# Patient Record
Sex: Male | Born: 1940 | Race: White | Hispanic: No | Marital: Married | Smoking: Never smoker
Health system: Southern US, Community
[De-identification: ages and names within clinical notes are randomized; demographics above are authoritative.]

## PROBLEM LIST (undated history)

## (undated) DIAGNOSIS — I10 Essential (primary) hypertension: Secondary | ICD-10-CM

## (undated) DIAGNOSIS — J189 Pneumonia, unspecified organism: Secondary | ICD-10-CM

## (undated) DIAGNOSIS — E785 Hyperlipidemia, unspecified: Secondary | ICD-10-CM

## (undated) DIAGNOSIS — F419 Anxiety disorder, unspecified: Secondary | ICD-10-CM

## (undated) DIAGNOSIS — E119 Type 2 diabetes mellitus without complications: Secondary | ICD-10-CM

## (undated) DIAGNOSIS — Z87442 Personal history of urinary calculi: Secondary | ICD-10-CM

## (undated) DIAGNOSIS — J349 Unspecified disorder of nose and nasal sinuses: Secondary | ICD-10-CM

## (undated) DIAGNOSIS — M549 Dorsalgia, unspecified: Secondary | ICD-10-CM

## (undated) DIAGNOSIS — M1990 Unspecified osteoarthritis, unspecified site: Secondary | ICD-10-CM

## (undated) DIAGNOSIS — N400 Enlarged prostate without lower urinary tract symptoms: Secondary | ICD-10-CM

## (undated) DIAGNOSIS — K219 Gastro-esophageal reflux disease without esophagitis: Secondary | ICD-10-CM

## (undated) DIAGNOSIS — C801 Malignant (primary) neoplasm, unspecified: Secondary | ICD-10-CM

## (undated) DIAGNOSIS — E291 Testicular hypofunction: Secondary | ICD-10-CM

## (undated) DIAGNOSIS — F32A Depression, unspecified: Secondary | ICD-10-CM

## (undated) DIAGNOSIS — T7840XA Allergy, unspecified, initial encounter: Secondary | ICD-10-CM

## (undated) DIAGNOSIS — M79606 Pain in leg, unspecified: Secondary | ICD-10-CM

## (undated) DIAGNOSIS — R7303 Prediabetes: Secondary | ICD-10-CM

## (undated) DIAGNOSIS — N529 Male erectile dysfunction, unspecified: Secondary | ICD-10-CM

## (undated) HISTORY — DX: Hyperlipidemia, unspecified: E78.5

## (undated) HISTORY — DX: Unspecified disorder of nose and nasal sinuses: J34.9

## (undated) HISTORY — DX: Dorsalgia, unspecified: M54.9

## (undated) HISTORY — PX: SKIN CANCER EXCISION: SHX779

## (undated) HISTORY — DX: Pain in leg, unspecified: M79.606

## (undated) HISTORY — PX: NECK SURGERY: SHX720

## (undated) HISTORY — PX: CHOLECYSTECTOMY: SHX55

## (undated) HISTORY — PX: OTHER SURGICAL HISTORY: SHX169

## (undated) HISTORY — PX: CATARACT EXTRACTION, BILATERAL: SHX1313

## (undated) HISTORY — DX: Allergy, unspecified, initial encounter: T78.40XA

---

## 1988-12-11 HISTORY — PX: CHOLECYSTECTOMY: SHX55

## 1997-04-12 HISTORY — PX: TRANSURETHRAL RESECTION OF PROSTATE: SHX73

## 2004-11-09 ENCOUNTER — Ambulatory Visit: Payer: Self-pay | Admitting: Gastroenterology

## 2004-11-20 ENCOUNTER — Encounter (INDEPENDENT_AMBULATORY_CARE_PROVIDER_SITE_OTHER): Payer: Self-pay | Admitting: *Deleted

## 2004-11-20 ENCOUNTER — Ambulatory Visit: Payer: Self-pay | Admitting: Gastroenterology

## 2004-11-26 ENCOUNTER — Ambulatory Visit (HOSPITAL_COMMUNITY): Admission: RE | Admit: 2004-11-26 | Discharge: 2004-11-26 | Payer: Self-pay | Admitting: Gastroenterology

## 2004-12-11 ENCOUNTER — Ambulatory Visit: Payer: Self-pay | Admitting: Gastroenterology

## 2005-02-25 ENCOUNTER — Ambulatory Visit: Payer: Self-pay | Admitting: Gastroenterology

## 2005-03-02 ENCOUNTER — Ambulatory Visit: Payer: Self-pay | Admitting: Cardiology

## 2005-04-02 ENCOUNTER — Ambulatory Visit: Payer: Self-pay | Admitting: Gastroenterology

## 2009-02-03 ENCOUNTER — Inpatient Hospital Stay (HOSPITAL_COMMUNITY): Admission: RE | Admit: 2009-02-03 | Discharge: 2009-02-03 | Payer: Self-pay | Admitting: Neurological Surgery

## 2009-10-03 ENCOUNTER — Encounter (INDEPENDENT_AMBULATORY_CARE_PROVIDER_SITE_OTHER): Payer: Self-pay | Admitting: *Deleted

## 2009-10-09 ENCOUNTER — Encounter (INDEPENDENT_AMBULATORY_CARE_PROVIDER_SITE_OTHER): Payer: Self-pay | Admitting: *Deleted

## 2009-11-11 ENCOUNTER — Encounter (INDEPENDENT_AMBULATORY_CARE_PROVIDER_SITE_OTHER): Payer: Self-pay | Admitting: *Deleted

## 2009-11-13 ENCOUNTER — Ambulatory Visit: Payer: Self-pay | Admitting: Gastroenterology

## 2009-11-20 ENCOUNTER — Ambulatory Visit: Payer: Self-pay | Admitting: Gastroenterology

## 2009-11-24 ENCOUNTER — Encounter: Payer: Self-pay | Admitting: Gastroenterology

## 2010-02-12 ENCOUNTER — Encounter: Admission: RE | Admit: 2010-02-12 | Discharge: 2010-02-12 | Payer: Self-pay | Admitting: Neurological Surgery

## 2010-05-12 NOTE — Letter (Signed)
Summary: Previsit letter  [facility]  [address] [place], [place] [address]   Phone: [phone]  Fax: [phone]       10/09/2009 MRN: [id number]  [facility] [address] [place], [place]  [address]  Dear Daniel Wagner,  Welcome to the Gastroenterology Division at [facility].    You are scheduled to see a nurse for your pre-procedure visit on 11/13/2009 at 10:30am on the 3rd floor at [facility], [address]  We ask that you try to arrive at our office 15 minutes prior to your appointment time to allow for check-in.  Your nurse visit will consist of discussing your medical and surgical history, your immediate family medical history, and your medications.    Please bring a complete list of all your medications or, if you prefer, bring the medication bottles and we will list them.  We will need to be aware of both prescribed and over the counter drugs.  We will need to know exact dosage information as well.  If you are on blood thinners (Coumadin, Plavix, Aggrenox, Ticlid, etc.) please call our office today/prior to your appointment, as we need to consult with your physician about holding your medication.   Please be prepared to read and sign documents such as consent forms, a financial agreement, and acknowledgement forms.  If necessary, and with your consent, a friend or relative is welcome to sit-in on the nurse visit with you.  Please bring your insurance card so that we may make a copy of it.  If your insurance requires a referral to see a specialist, please bring your referral form from your primary care physician.  No co-pay is required for this nurse visit.     If you cannot keep your appointment, please call [phone] to cancel or reschedule prior to your appointment date.  This allows [place] the opportunity to schedule an appointment for another patient in need of care.    Thank you for choosing [facility] for your medical needs.   We appreciate the opportunity to care for you.  Please visit [place] at our website  to learn more about our practice.                     Sincerely.                                                                                                                   The Gastroenterology Division

## 2010-05-12 NOTE — Letter (Signed)
Summary: Colonoscopy Letter  [facility]  [address] [place], [place] [address]   Phone: [phone]  Fax: [phone]      October 03, 2009 MRN: [id number]   [facility] [address] [place], [place]  [address]   Dear Daniel Wagner,   According to your medical record, it is time for you to schedule a Colonoscopy. The American Cancer Society recommends this procedure as a method to detect early colon cancer. Patients with a family history of colon cancer, or a personal history of colon polyps or inflammatory bowel disease are at increased risk.  This letter has beeen generated based on the recommendations made at the time of your procedure. If you feel that in your particular situation this may no longer apply, please contact our office.  Please call our office at [phone] to schedule this appointment or to update your records at your earliest convenience.  Thank you for cooperating with [place] to provide you with the very best care possible.   Sincerely,  Judie Petit T. Russella Dar, M.D.  [facility] [phone]

## 2010-05-12 NOTE — Procedures (Signed)
Summary: Colonoscopy  Patient: Daniel Wagner Note: All result statuses are Final unless otherwise noted.  Tests: (1) Colonoscopy (COL)   COL Colonoscopy           DONE     [facility]     [address]     [place], [place]  [address]           COLONOSCOPY PROCEDURE REPORT     PATIENT:  Daniel Wagner, Daniel Wagner  MR#:  [id number]     BIRTHDATE:  1940-10-06, 69 yrs. old  GENDER:  male     ENDOSCOPIST:  Judie Petit T. 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     MEDICATIONS:   Fentanyl 75 mcg IV, Versed 9 mg IV     DESCRIPTION OF PROCEDURE:   After the risks benefits and     alternatives of the procedure were thoroughly explained, informed     consent was obtained.  Digital rectal exam was performed and     revealed no abnormalities.   The LB PCF-H180AL [id number] endoscope     was introduced through the anus and advanced to the cecum, which     was identified by both the appendix and ileocecal valve, without     limitations.  The quality of the prep was good, using MoviPrep.     The instrument was then slowly withdrawn as the colon was fully     examined.     <<PROCEDUREIMAGES>>     FINDINGS:  A sessile polyp was found in the mid transverse colon.     It was 4 mm in size. The polyp was removed using cold biopsy     forceps.  A normal appearing cecum, ileocecal valve, and     appendiceal orifice were identified. The ascending, hepatic     flexure, splenic flexure, descending, sigmoid colon, and rectum     appeared unremarkable. Retroflexed views in the rectum revealed no     abnormalities.  The time to cecum =  2  minutes. The scope was     then withdrawn (time =  10  min) from the patient and the     procedure completed.           COMPLICATIONS:  None           ENDOSCOPIC IMPRESSION:     1) 4 mm  sessile polyp in the mid transverse colon           RECOMMENDATIONS:     1) Await pathology results     2) Repeat Colonoscopy in 5 years pending pathology review           Malcolm T. Russella Dar, MD, [facility]           n.     eSIGNED:   Venita Lick. Stark at 11/20/2009 10:52 AM           Eliane Decree, [id number]  Note: An exclamation mark (!) indicates a result that was not dispersed into the flowsheet. Document Creation Date: 11/20/2009 10:53 AM _______________________________________________________________________  (1) Order result status: Final Collection or observation date-time: 11/20/2009 10:47 Requested date-time:  Receipt date-time:  Reported date-time:  Referring Physician:   Ordering Physician: Judie Petit  Russella Dar [phone]) Specimen Source:  Source: Launa Grill Order Number: [id number] Lab site:   Appended Document: Colonoscopy     Procedures Next Due Date:    Colonoscopy: 11/2014

## 2010-05-12 NOTE — Miscellaneous (Signed)
Summary: [facility]  Clinical Lists Changes  Medications: Added new medication of MOVIPREP 100 GM  SOLR (PEG-KCL-NACL-NASULF-NA ASC-C) As per prep instructions. - Signed Rx of MOVIPREP 100 GM  SOLR (PEG-KCL-NACL-NASULF-NA ASC-C) As per prep instructions.;  #1 x 0;  Signed;  Entered by: Ezra Sites RN;  Authorized by: Meryl Dare MD [facility] Graham;  Method used: Electronically to [facility] [address] [place], [place], [place]  [address], Ph: [phone] or [phone], Fax: [phone] Allergies: Added new allergy or adverse reaction of ASA Observations: Added new observation of NKA: F (11/13/2009 10:01)    Prescriptions: MOVIPREP 100 GM  SOLR (PEG-KCL-NACL-NASULF-NA ASC-C) As per prep instructions.  #1 x 0   Entered by:   Ezra Sites RN   Authorized by:   Meryl Dare MD [facility]   Signed by:   Ezra Sites RN on 11/13/2009   Method used:   Electronically to        [facility]       [address]       [place], [place]  [address]       Ph: [phone] or [phone]       Fax: [phone]   RxID:   [id number]

## 2010-05-12 NOTE — Letter (Signed)
Summary: Patient Notice- Polyp Results  [facility]  [address] [place], [place] [address]   Phone: [phone]  Fax: [phone]        November 24, 2009 MRN: [id number]    [facility] [address] [place], [place]  [address]    Dear Daniel Wagner,  I am pleased to inform you that the colon polyp(s) removed during your recent colonoscopy was (were) found to be benign (no cancer detected) upon pathologic examination.  I recommend you have a repeat colonoscopy examination in 5 years to look for recurrent polyps, as having colon polyps increases your risk for having recurrent polyps or even colon cancer in the future.  Should you develop new or worsening symptoms of abdominal pain, bowel habit changes or bleeding from the rectum or bowels, please schedule an evaluation with either your primary care physician or with me.  Continue treatment plan as outlined the day of your exam.  Please call us if you are having persistent problems or have questions about your condition that have not been fully answered at this time.  Sincerely,  Meryl Dare MD [facility]  This letter has been electronically signed by your physician.  Appended Document: Patient Notice- Polyp Results letter mailed

## 2010-05-12 NOTE — Letter (Signed)
Summary: [facility] Instructions  [facility]  [address] [place], [place] [address]   Phone: [phone]  Fax: [phone]       Daniel Wagner    68-28-1942    MRN: [id number]        Procedure Day [name]:  Daniel Wagner  11/20/09     Arrival Time: 9:30am     Procedure Time: 10:30am     Location of Procedure:                    Daniel Wagner  [facility] (4th Floor)                        PREPARATION FOR COLONOSCOPY WITH MOVIPREP   Starting 5 days prior to your procedure  SATURDAY 08/06  do not eat nuts, seeds, popcorn, corn, beans, peas,  salads, or any raw vegetables.  Do not take any fiber supplements (e.g. Metamucil, Citrucel, and Benefiber).  THE DAY BEFORE YOUR PROCEDURE         DATE:  [facility]  08/10  1.  Drink clear liquids the entire day-NO SOLID FOOD  2.  Do not drink anything colored red or purple.  Avoid juices with pulp.  No orange juice.  3.  Drink at least 64 oz. (8 glasses) of fluid/clear liquids during the day to prevent dehydration and help the prep work efficiently.  CLEAR LIQUIDS INCLUDE: Water Jello Ice Popsicles Tea (sugar ok, no milk/cream) Powdered fruit flavored drinks Coffee (sugar ok, no milk/cream) Gatorade Juice: apple, white grape, white cranberry  Lemonade Clear bullion, consomm, broth Carbonated beverages (any kind) Strained chicken noodle soup Hard Candy                             4.  In the morning, mix first dose of MoviPrep solution:    Empty 1 Pouch A and 1 Pouch B into the disposable container    Add lukewarm drinking water to the top line of the container. Mix to dissolve    Refrigerate (mixed solution should be used within 24 hrs)  5.  Begin drinking the prep at 5:00 p.m. The MoviPrep container is divided by 4 marks.   Every 15 minutes drink the solution down to the next mark (approximately 8 oz) until the full liter is complete.   6.  Follow completed prep with 16 oz of clear liquid of your choice  (Nothing red or purple).  Continue to drink clear liquids until bedtime.  7.  Before going to bed, mix second dose of MoviPrep solution:    Empty 1 Pouch A and 1 Pouch B into the disposable container    Add lukewarm drinking water to the top line of the container. Mix to dissolve    Refrigerate  THE DAY OF YOUR PROCEDURE      DATE: THURSDAY  08/11  Beginning at  5:30 a.m. (5 hours before procedure):         1. Every 15 minutes, drink the solution down to the next mark (approx 8 oz) until the full liter is complete.  2. Follow completed prep with 16 oz. of clear liquid of your choice.    3. You may drink clear liquids until 8:30am (2 HOURS BEFORE PROCEDURE).   MEDICATION INSTRUCTIONS  Unless otherwise instructed, you should take regular prescription medications with a small sip of water   as early as possible  the morning of your procedure.          OTHER INSTRUCTIONS  You will need a responsible adult at least 68 years of age to accompany you and drive you home.   This person must remain in the waiting room during your procedure.  Wear loose fitting clothing that is easily removed.  Leave jewelry and other valuables at home.  However, you may wish to bring a book to read or  an iPod/MP3 player to listen to music as you wait for your procedure to start.  Remove all body piercing jewelry and leave at home.  Total time from sign-in until discharge is approximately 2-3 hours.  You should go home directly after your procedure and rest.  You can resume normal activities the  day after your procedure.  The day of your procedure you should not:   Drive   Make legal decisions   Operate machinery   Drink alcohol   Return to work  You will receive specific instructions about eating, activities and medications before you leave.    The above instructions have been reviewed and explained to me by   Daniel Sites RN  November 13, 2009 10:50 AM     I fully understand  and can verbalize these instructions _____________________________ Date _________

## 2010-07-16 LAB — BASIC METABOLIC PANEL
Calcium: 9.5 mg/dL (ref 8.4–10.5)
Chloride: 102 mEq/L (ref 96–112)
GFR calc non Af Amer: >60 mL/min (ref >60)
Glucose, Bld: 99 mg/dL (ref 70–99)
Potassium: 5.2 mEq/L — ABNORMAL HIGH (ref 3.5–5.1)
Sodium: 138 mEq/L (ref 135–145)

## 2010-07-16 LAB — CBC
Hemoglobin: 14.6 g/dL (ref 13.0–17.0)
Platelets: 200 10*3/uL (ref 150–400)
RBC: 4.13 MIL/uL — ABNORMAL LOW (ref 4.22–5.81)
RDW: 12.7 % (ref 11.5–15.5)

## 2010-08-11 HISTORY — PX: BACK SURGERY: SHX140

## 2010-09-08 ENCOUNTER — Encounter (HOSPITAL_COMMUNITY)
Admission: RE | Admit: 2010-09-08 | Discharge: 2010-09-08 | Disposition: A | Discharge status: Home / Self Care | Payer: Medicare Other | Source: Ambulatory Visit | Attending: Neurological Surgery | Admitting: Neurological Surgery

## 2010-09-08 ENCOUNTER — Other Ambulatory Visit (HOSPITAL_COMMUNITY): Payer: Self-pay | Admitting: Neurological Surgery

## 2010-09-08 ENCOUNTER — Ambulatory Visit (HOSPITAL_COMMUNITY)
Admission: RE | Admit: 2010-09-08 | Discharge: 2010-09-08 | Disposition: A | Discharge status: Home / Self Care | Payer: Medicare Other | Source: Ambulatory Visit | Attending: Neurological Surgery | Admitting: Neurological Surgery

## 2010-09-08 DIAGNOSIS — Z01812 Encounter for preprocedural laboratory examination: Secondary | ICD-10-CM | POA: Insufficient documentation

## 2010-09-08 DIAGNOSIS — M5137 Other intervertebral disc degeneration, lumbosacral region: Secondary | ICD-10-CM | POA: Insufficient documentation

## 2010-09-08 DIAGNOSIS — M51379 Other intervertebral disc degeneration, lumbosacral region without mention of lumbar back pain or lower extremity pain: Secondary | ICD-10-CM | POA: Insufficient documentation

## 2010-09-08 DIAGNOSIS — M5136 Other intervertebral disc degeneration, lumbar region: Secondary | ICD-10-CM

## 2010-09-08 DIAGNOSIS — Z01810 Encounter for preprocedural cardiovascular examination: Secondary | ICD-10-CM | POA: Insufficient documentation

## 2010-09-08 LAB — BASIC METABOLIC PANEL
CO2: 30 mEq/L (ref 19–32)
Creatinine, Ser: 1.03 mg/dL (ref 0.4–1.5)
GFR calc non Af Amer: >60 mL/min (ref >60)
Glucose, Bld: 89 mg/dL (ref 70–99)
Sodium: 138 mEq/L (ref 135–145)

## 2010-09-08 LAB — CBC
HCT: 43.7 % (ref 39.0–52.0)
MCHC: 34.3 g/dL (ref 30.0–36.0)
Platelets: 200 10*3/uL (ref 150–400)
RDW: 12.6 % (ref 11.5–15.5)

## 2010-09-08 LAB — SURGICAL PCR SCREEN: Staphylococcus aureus: NEGATIVE

## 2010-09-10 ENCOUNTER — Inpatient Hospital Stay (HOSPITAL_COMMUNITY): Payer: Medicare Other

## 2010-09-10 ENCOUNTER — Inpatient Hospital Stay (HOSPITAL_COMMUNITY)
Admission: RE | Admit: 2010-09-10 | Discharge: 2010-09-13 | DRG: 458 | Disposition: A | Discharge status: Home / Self Care | Payer: Medicare Other | Source: Ambulatory Visit | Attending: Neurological Surgery | Admitting: Neurological Surgery

## 2010-09-10 DIAGNOSIS — Z79899 Other long term (current) drug therapy: Secondary | ICD-10-CM

## 2010-09-10 DIAGNOSIS — M47817 Spondylosis without myelopathy or radiculopathy, lumbosacral region: Secondary | ICD-10-CM | POA: Diagnosis present

## 2010-09-10 DIAGNOSIS — I10 Essential (primary) hypertension: Secondary | ICD-10-CM | POA: Diagnosis present

## 2010-09-10 DIAGNOSIS — K219 Gastro-esophageal reflux disease without esophagitis: Secondary | ICD-10-CM | POA: Diagnosis present

## 2010-09-10 DIAGNOSIS — M48062 Spinal stenosis, lumbar region with neurogenic claudication: Secondary | ICD-10-CM | POA: Diagnosis present

## 2010-09-10 DIAGNOSIS — M4120 Other idiopathic scoliosis, site unspecified: Principal | ICD-10-CM | POA: Diagnosis present

## 2010-09-10 LAB — POCT I-STAT 4, (NA,K, GLUC, HGB,HCT)
Glucose, Bld: 116 mg/dL — ABNORMAL HIGH (ref 70–99)
Glucose, Bld: 112 mg/dL — ABNORMAL HIGH (ref 70–99)
Hemoglobin: 9.5 g/dL — ABNORMAL LOW (ref 13.0–17.0)
Hemoglobin: 9.5 g/dL — ABNORMAL LOW (ref 13.0–17.0)
Hemoglobin: 10.9 g/dL — ABNORMAL LOW (ref 13.0–17.0)
Potassium: 4.1 mEq/L (ref 3.5–5.1)
Potassium: 4.3 mEq/L (ref 3.5–5.1)
Potassium: 4.5 mEq/L (ref 3.5–5.1)
Sodium: 140 mEq/L (ref 135–145)
Sodium: 139 mEq/L (ref 135–145)
Sodium: 139 mEq/L (ref 135–145)
Sodium: 141 mEq/L (ref 135–145)

## 2010-09-10 LAB — CBC
MCH: 32.7 pg (ref 26.0–34.0)
MCHC: 34.5 g/dL (ref 30.0–36.0)
MCV: 94.9 fL (ref 78.0–100.0)
RDW: 12.8 % (ref 11.5–15.5)
WBC: 10.4 10*3/uL (ref 4.0–10.5)

## 2010-09-10 LAB — PROTIME-INR: Prothrombin Time: 19.5 seconds — ABNORMAL HIGH (ref 11.6–15.2)

## 2010-09-10 LAB — GLUCOSE, CAPILLARY: Glucose-Capillary: 126 mg/dL — ABNORMAL HIGH (ref 70–99)

## 2010-09-10 LAB — APTT: aPTT: 31 seconds (ref 24–37)

## 2010-09-11 LAB — CBC
HCT: 32.2 % — ABNORMAL LOW (ref 39.0–52.0)
Hemoglobin: 10.9 g/dL — ABNORMAL LOW (ref 13.0–17.0)
MCH: 32.3 pg (ref 26.0–34.0)
MCHC: 33.9 g/dL (ref 30.0–36.0)
RBC: 3.37 MIL/uL — ABNORMAL LOW (ref 4.22–5.81)

## 2010-09-11 LAB — BASIC METABOLIC PANEL
CO2: 26 mEq/L (ref 19–32)
Calcium: 6.9 mg/dL — ABNORMAL LOW (ref 8.4–10.5)
Chloride: 103 mEq/L (ref 96–112)
Glucose, Bld: 166 mg/dL — ABNORMAL HIGH (ref 70–99)
Sodium: 134 mEq/L — ABNORMAL LOW (ref 135–145)

## 2010-09-11 LAB — PROTIME-INR: INR: 1.17 (ref 0.00–1.49)

## 2010-09-11 LAB — APTT: aPTT: 26 seconds (ref 24–37)

## 2010-09-12 LAB — TYPE AND SCREEN
ABO/RH(D): A POS
Antibody Screen: NEGATIVE
Unit division: 0
Unit division: 0

## 2010-09-12 LAB — BASIC METABOLIC PANEL
CO2: 28 mEq/L (ref 19–32)
Calcium: 7.4 mg/dL — ABNORMAL LOW (ref 8.4–10.5)
Chloride: 103 mEq/L (ref 96–112)
GFR calc Af Amer: >60 mL/min (ref >60)
Glucose, Bld: 123 mg/dL — ABNORMAL HIGH (ref 70–99)
Potassium: 3.9 mEq/L (ref 3.5–5.1)
Sodium: 135 mEq/L (ref 135–145)

## 2010-09-12 LAB — CBC
HCT: 24.3 % — ABNORMAL LOW (ref 39.0–52.0)
Hemoglobin: 8.5 g/dL — ABNORMAL LOW (ref 13.0–17.0)
MCHC: 35 g/dL (ref 30.0–36.0)
RBC: 2.61 MIL/uL — ABNORMAL LOW (ref 4.22–5.81)

## 2010-10-15 NOTE — Discharge Summary (Signed)
Daniel Wagner, Daniel Wagner                  ACCOUNT NO.:  [id number]  MEDICAL RECORD NO.:  [id number]           PATIENT TYPE:  I  LOCATION:  3037                         FACILITY:  [facility]  PHYSICIAN:  Stefani Dama, M.D.  DATE OF BIRTH:  1940-05-29  DATE OF ADMISSION:  09/10/2010 DATE OF DISCHARGE:  09/13/2010                              DISCHARGE SUMMARY   ADMITTING DIAGNOSIS:  Spondylosis, degenerative scoliosis, and spinal stenosis multifactorial L2, L3, L4, L5 with radiculopathy and low back pain.  DISCHARGE DIAGNOSIS:  Spondylosis, degenerative scoliosis, and spinal stenosis multifactorial L2, L3, L4, L5 with radiculopathy and low back pain.  OPERATIONS AND PROCEDURES:  Decompression of the L2, L3, L4, L5 nerve roots with laminectomy, posterolateral interbody fusion with PEEK spacers, auto and allograft, and segmental pedicle screw fixation L2 to L5.  BRIEF HISTORY AND HOSPITAL COURSE:  The patient is a 70 year old gentleman who had significant progressive back and bilateral lower extremity pain with severe spondylitic stenosis at L2-3 and L4-5, moderate changes at L3-4 after failing conservative care and had been advised for decompression and fusion L2-L5 the patient elects to proceed.  He tolerated this procedure well.  On Sep 10, 2010, he was placed on Dilaudid PCA pump postoperatively.  First day postoperatively, he was feeling well, eating well.  Foley catheter was discontinued. Vital signs were stable.  He was afebrile, neurovascularly intact.  His wound was benign.  No erythema, drainage, or sinus infection.  Weaned off the PCA to p.o. pain meds.  Bio tech came to fit him with a brace. He was transferred to 3000 out of the surgical ICU.  He was placed on a bowel regimen.  He remained neurologically intact throughout his hospital stay.  His wound remained benign.  He remained afebrile.  Vital signs were stable.  He was eating well and voiding well at the time  of discharge, worked with physical therapy and occupational therapy for progressive ambulation.  Continued to make good progress, ambulating safely with Aspen QuikDraw brace in place.  He was instructed on back precautions, ready for discharge home on September 13, 2010, eating well, voiding well, neurovascularly intact.  DISCHARGE CONDITION:  Stable, improved.  DISCHARGE INSTRUCTIONS:  Discharge home.  Given prescription for Percocet 5/325 one to two p.o. q.4 h. p.r.n. pain #60 tablets, Valium 5 mg one p.o. q.i.d. p.r.n. muscle spasm #40 tablets.  Regular home diet. He may shower.  Contact our office prior to follow up for any question, concerns, and call for an appointment at [phone] for an appointment for about 3 weeks.  He is to continue on his home medications of potassium chloride over the counter, cranberry over the counter, multivitamins over the counter, glucosamine/chondroitin over the counter, zinc over the counter, garlic extract over the counter, niacin over the counter, omega-3 1 g, vitamin B12, artificial tears, nasal saline drops, AndroGel, testosterone 1.62 topically daily, dicyclomine 10 mg one p.o. q.6 h. p.r.n., lisinopril 20 mg p.o. daily, lovastatin 20 mg p.o. daily, Percocet 5/325, and Valium 5 mg as described above.  Contact our office prior to follow up with any question  or concerns.  DISCHARGE CONDITION:  Stable, improved.     Aura Fey Bobbe Medico.   ______________________________ Stefani Dama, M.D.    SCI/MEDQ  D:  09/13/2010  T:  09/14/2010  Job:  [id number]  Electronically Signed by Orlin Hilding P.A. on 09/23/2010 11:56:29 AM Electronically Signed by Barnett Abu M.D. on 10/15/2010 05:16:14 PM

## 2010-10-15 NOTE — Op Note (Signed)
NAMEJERRY, CLYNE                  ACCOUNT NO.:  [id number]  MEDICAL RECORD NO.:  [id number]           PATIENT TYPE:  I  LOCATION:  2110                         FACILITY:  [facility]  PHYSICIAN:  Stefani Dama, M.D.  DATE OF BIRTH:  1940/08/02  DATE OF PROCEDURE:  09/10/2010 DATE OF DISCHARGE:                              OPERATIVE REPORT   PREOPERATIVE DIAGNOSES: 1. Lumbar spondylosis. 2. Lumbar scoliosis. 3. 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  POSTOPERATIVE DIAGNOSES: 1. Lumbar spondylosis. 2. Lumbar scoliosis. 3. 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  OPERATION:  Decompression of the L2, L3, L4, and L5 nerve roots bilaterally via laminectomy of L2, L3, and L4 complete, undercutting of L5 lamina greater than required for simple posterior interbody arthrodesis, posterior lumbar interbody arthrodesis with PEEK spacers,local autograft, and allograft at L2-3, L3-4, and L4-5, segmental fixation with pedicle screws L2-L5, and posterolateral arthrodesis with local autograft and allograft of L2-L5.  SURGEON:  Stefani Dama, MD  FIRST ASSISTANT:  Coletta Memos, MD  ANESTHESIA:  General endotracheal.  INDICATIONS:  Vishaal Strollo is a 69 year old individual who has had significant progressive back and bilateral lower extremity pain and severe spondylotic stenosis most notable at L2-3 and again at L4-5 with moderate spondylitic changes and lateral recess stenosis at L3-4.  After carefully considering the surgical options, I advised compression and fusion from L2-L5.  He is now taken to the operating room to undergo this procedure.  PROCEDURE:  The patient was brought to the operating room, supine on the stretcher.  After smooth induction of general endotracheal anesthesia, turned prone.  Back was prepped with alcohol and DuraPrep, and draped in the sterile fashion.   He had a midline incision, was created in the lower lumbar spine and carried down to the lumbodorsal fascia.  First spinous processes were identified at that of L2 and L3 positively on radiographs, and then dissection was carried out into the lateral gutters to expose the interspace at the laminar arches of L2, L3, L4, and transverse process of L2 was identified, skeletonized, and packed off as were the transverse processes at L3, L4, and L5 bilaterally. Laminectomy was then performed by removing the spinous processes and laminar arch of L3 and L4 completely, high-speed drill was used to thin the base of the lamina so that the laminectomy can be completed using an 3 and a 4-mm Kerrison punch.  Laminectomy was widened and common dural tube was then carefully decompressed.  There was noted be significant quite substantial bleeding from the epidural space in the lateral gutters at the nerve roots where each decompressed.  This was controlled with bipolar cautery, but did not respond very well.  Bipolar cautery required significant packing with Gelfoam and cottonoid; however, the work was slow because of the substantial epidural bleeding that was recurrent with every move at every level.  Ultimately, I was able to decompress the lateral gutters at L4-L5, decompressed the L5 nerve roots, and then we sequentially worked our way  up decompressing the L4, the L3, and the L2 nerve roots.  Once this was completed, diskectomies were performed at L4-5, and spacers were placed in the intervertebral space.  At L4-L5, a 12-mm spacer could be placed on the patient's right side, which allowed for opening up of the scoliotic curve, which was closed on that side.  At L3-L4, 10-mm spacers were placed bilaterally. This was a neutral vertebrae and then at L2-L3, a 9-mm spacer could be placed on the left side to open up back closed portion of the curve. The interbody graft was instilled into the interspaces once  the complete diskectomy was performed at each level.  This was a combination of autograft mixed with Vitoss that was soaked in PureGen, a stem cell preparation.  With the interspaces being grafted and the nerve roots decompressed, pedicle entry sites of the chosen radiographically.  Using 6.5 x 50 mm pedicle screws at L2, 6.5 x 50 mm pedicle screws at L3, 6.5 x 45 mm pedicle screws at L4, and L6 0.5 x 50 mm screws at L5.  Once these were placed, 100-mm precontoured rods were placed between the saddle heads and secured.  The lateral gutters were then decorticated and packed with the bone graft.  Final radiographs were obtained identifying good position of the hardware and stable reduction.  The nerve roots were then sounded again to make sure that they are free and clear.  Because of the continuous blood loss during the procedure, he experienced a 3500 mL of blood loss, 1600 mL of Cell Saver blood were returned to the patient, and the lumbodorsal fascia was closed with #1 Vicryl in interrupted fashion, 2-0 Vicryl was used subcutaneously, 3-0 Vicryl subcuticularly, and Dermabond was placed on the skin.  The patient tolerated the procedure well and was returned to recovery room in stable condition.     Stefani Dama, M.D.     Merla Riches  D:  09/10/2010  T:  09/11/2010  Job:  [id number]  Electronically Signed by Barnett Abu M.D. on 10/15/2010 05:16:09 PM

## 2011-04-08 ENCOUNTER — Other Ambulatory Visit: Payer: Self-pay | Admitting: Neurological Surgery

## 2011-04-08 DIAGNOSIS — M542 Cervicalgia: Secondary | ICD-10-CM

## 2011-04-08 DIAGNOSIS — M5030 Other cervical disc degeneration, unspecified cervical region: Secondary | ICD-10-CM

## 2011-04-11 ENCOUNTER — Ambulatory Visit
Admission: RE | Admit: 2011-04-11 | Discharge: 2011-04-11 | Disposition: A | Discharge status: Home / Self Care | Payer: Medicare Other | Source: Ambulatory Visit | Attending: Neurological Surgery | Admitting: Neurological Surgery

## 2011-04-11 DIAGNOSIS — M542 Cervicalgia: Secondary | ICD-10-CM

## 2011-04-11 DIAGNOSIS — M5030 Other cervical disc degeneration, unspecified cervical region: Secondary | ICD-10-CM

## 2011-04-21 ENCOUNTER — Other Ambulatory Visit: Payer: Self-pay | Admitting: Neurological Surgery

## 2011-04-21 ENCOUNTER — Encounter (HOSPITAL_COMMUNITY): Payer: Self-pay | Admitting: Respiratory Therapy

## 2011-04-22 ENCOUNTER — Encounter (HOSPITAL_COMMUNITY): Payer: Self-pay | Admitting: Surgery

## 2011-04-22 MED ORDER — CEFAZOLIN SODIUM 1-5 GM-% IV SOLN
1.0000 g | INTRAVENOUS | Status: AC
  Administered 2011-04-23: 1 g via INTRAVENOUS
  Filled 2011-04-22: qty 50

## 2011-04-23 ENCOUNTER — Ambulatory Visit (HOSPITAL_COMMUNITY)
Admission: RE | Admit: 2011-04-23 | Discharge: 2011-04-23 | Disposition: A | Discharge status: Home / Self Care | Payer: Medicare Other | Source: Ambulatory Visit | Attending: Neurological Surgery | Admitting: Neurological Surgery

## 2011-04-23 ENCOUNTER — Inpatient Hospital Stay (HOSPITAL_COMMUNITY): Payer: Medicare Other

## 2011-04-23 ENCOUNTER — Inpatient Hospital Stay (HOSPITAL_COMMUNITY): Payer: Medicare Other | Admitting: Anesthesiology

## 2011-04-23 ENCOUNTER — Encounter (HOSPITAL_COMMUNITY)
Admission: RE | Disposition: A | Discharge status: Home / Self Care | Payer: Self-pay | Source: Ambulatory Visit | Attending: Neurological Surgery

## 2011-04-23 ENCOUNTER — Encounter (HOSPITAL_COMMUNITY): Payer: Self-pay | Admitting: Anesthesiology

## 2011-04-23 ENCOUNTER — Encounter (HOSPITAL_COMMUNITY): Payer: Self-pay | Admitting: *Deleted

## 2011-04-23 DIAGNOSIS — M5000 Cervical disc disorder with myelopathy, unspecified cervical region: Secondary | ICD-10-CM

## 2011-04-23 DIAGNOSIS — I10 Essential (primary) hypertension: Secondary | ICD-10-CM | POA: Insufficient documentation

## 2011-04-23 DIAGNOSIS — K219 Gastro-esophageal reflux disease without esophagitis: Secondary | ICD-10-CM | POA: Insufficient documentation

## 2011-04-23 HISTORY — DX: Essential (primary) hypertension: I10

## 2011-04-23 HISTORY — DX: Gastro-esophageal reflux disease without esophagitis: K21.9

## 2011-04-23 LAB — COMPREHENSIVE METABOLIC PANEL
ALT: 26 U/L (ref 0–53)
AST: 25 U/L (ref 0–37)
Albumin: 3.9 g/dL (ref 3.5–5.2)
Calcium: 9.4 mg/dL (ref 8.4–10.5)
Creatinine, Ser: 0.93 mg/dL (ref 0.50–1.35)
GFR calc non Af Amer: 84 mL/min — ABNORMAL LOW (ref >90)
Sodium: 141 mEq/L (ref 135–145)
Total Protein: 6.8 g/dL (ref 6.0–8.3)

## 2011-04-23 LAB — CBC
HCT: 43.4 % (ref 39.0–52.0)
Hemoglobin: 14.7 g/dL (ref 13.0–17.0)
MCH: 32.8 pg (ref 26.0–34.0)
MCHC: 33.9 g/dL (ref 30.0–36.0)
RBC: 4.48 MIL/uL (ref 4.22–5.81)

## 2011-04-23 LAB — SURGICAL PCR SCREEN: MRSA, PCR: NEGATIVE

## 2011-04-23 SURGERY — POSTERIOR CERVICAL LAMINECTOMY WITH MET- RX
Anesthesia: General | Site: Neck | Laterality: Right | Wound class: Clean

## 2011-04-23 MED ORDER — LIDOCAINE-EPINEPHRINE 1 %-1:100000 IJ SOLN
INTRAMUSCULAR | Status: DC | PRN
  Administered 2011-04-23: 4.5 mL

## 2011-04-23 MED ORDER — SODIUM CHLORIDE 0.9 % IR SOLN
Status: DC | PRN
  Administered 2011-04-23: 12:00:00

## 2011-04-23 MED ORDER — MEPERIDINE HCL 25 MG/ML IJ SOLN
6.2500 mg | INTRAMUSCULAR | Status: DC | PRN
Start: 2011-04-23 — End: 2011-04-27

## 2011-04-23 MED ORDER — FENTANYL CITRATE 0.05 MG/ML IJ SOLN
INTRAMUSCULAR | Status: DC | PRN
  Administered 2011-04-23: 100 ug via INTRAVENOUS
  Administered 2011-04-23 (×2): 50 ug via INTRAVENOUS

## 2011-04-23 MED ORDER — PROPOFOL 10 MG/ML IV EMUL
INTRAVENOUS | Status: DC | PRN
  Administered 2011-04-23: 150 mg via INTRAVENOUS
  Administered 2011-04-23: 200 mg via INTRAVENOUS

## 2011-04-23 MED ORDER — MORPHINE SULFATE 4 MG/ML IJ SOLN
INTRAMUSCULAR | Status: AC
  Filled 2011-04-23: qty 1

## 2011-04-23 MED ORDER — PROMETHAZINE HCL 25 MG/ML IJ SOLN: 6.2500 mg | INTRAMUSCULAR | Status: DC | PRN

## 2011-04-23 MED ORDER — MIDAZOLAM HCL 5 MG/5ML IJ SOLN
INTRAMUSCULAR | Status: DC | PRN
  Administered 2011-04-23: 2 mg via INTRAVENOUS

## 2011-04-23 MED ORDER — 0.9 % SODIUM CHLORIDE (POUR BTL) OPTIME
TOPICAL | Status: DC | PRN
  Administered 2011-04-23: 1000 mL

## 2011-04-23 MED ORDER — SODIUM CHLORIDE 0.9 % IV SOLN
INTRAVENOUS | Status: AC
  Filled 2011-04-23: qty 500

## 2011-04-23 MED ORDER — DEXAMETHASONE SODIUM PHOSPHATE 10 MG/ML IJ SOLN
INTRAMUSCULAR | Status: DC | PRN
  Administered 2011-04-23: 10 mg via INTRAVENOUS

## 2011-04-23 MED ORDER — ROCURONIUM BROMIDE 100 MG/10ML IV SOLN
INTRAVENOUS | Status: DC | PRN
  Administered 2011-04-23: 10 mg via INTRAVENOUS
  Administered 2011-04-23: 20 mg via INTRAVENOUS
  Administered 2011-04-23: 50 mg via INTRAVENOUS
  Administered 2011-04-23: 20 mg via INTRAVENOUS

## 2011-04-23 MED ORDER — MORPHINE SULFATE 4 MG/ML IJ SOLN
1.0000 mg | INTRAMUSCULAR | Status: DC | PRN
  Administered 2011-04-23: 1 mg via INTRAVENOUS

## 2011-04-23 MED ORDER — FENTANYL CITRATE 0.05 MG/ML IJ SOLN
INTRAMUSCULAR | Status: AC
  Filled 2011-04-23: qty 2

## 2011-04-23 MED ORDER — ONDANSETRON HCL 4 MG/2ML IJ SOLN
INTRAMUSCULAR | Status: DC | PRN
  Administered 2011-04-23 (×2): 4 mg via INTRAVENOUS

## 2011-04-23 MED ORDER — LACTATED RINGERS IV SOLN
INTRAVENOUS | Status: DC | PRN
  Administered 2011-04-23: 11:00:00 via INTRAVENOUS

## 2011-04-23 MED ORDER — BACITRACIN 50000 UNITS IM SOLR
INTRAMUSCULAR | Status: AC
  Filled 2011-04-23: qty 50000

## 2011-04-23 MED ORDER — THROMBIN 5000 UNITS EX KIT
PACK | CUTANEOUS | Status: DC | PRN
  Administered 2011-04-23 (×2): 5000 [IU] via TOPICAL

## 2011-04-23 MED ORDER — BUPIVACAINE HCL (PF) 0.5 % IJ SOLN
INTRAMUSCULAR | Status: DC | PRN
  Administered 2011-04-23: 4.5 mL

## 2011-04-23 MED ORDER — FENTANYL CITRATE 0.05 MG/ML IJ SOLN
50.0000 ug | INTRAMUSCULAR | Status: DC | PRN
  Administered 2011-04-23: 25 ug via INTRAVENOUS
  Administered 2011-04-23: 50 ug via INTRAVENOUS

## 2011-04-23 MED ORDER — MIDAZOLAM HCL 2 MG/2ML IJ SOLN
INTRAMUSCULAR | Status: AC
  Filled 2011-04-23: qty 2

## 2011-04-23 MED ORDER — MIDAZOLAM HCL 2 MG/2ML IJ SOLN
1.0000 mg | INTRAMUSCULAR | Status: DC | PRN
  Administered 2011-04-23: 1 mg via INTRAVENOUS
  Administered 2011-04-23: 0.5 mg via INTRAVENOUS

## 2011-04-23 MED ORDER — MORPHINE SULFATE 2 MG/ML IJ SOLN: 0.0500 mg/kg | INTRAMUSCULAR | Status: DC | PRN

## 2011-04-23 MED ORDER — HEMOSTATIC AGENTS (NO CHARGE) OPTIME
TOPICAL | Status: DC | PRN
  Administered 2011-04-23: 1 via TOPICAL

## 2011-04-23 MED ORDER — KETOROLAC TROMETHAMINE 15 MG/ML IJ SOLN: 15.0000 mg | Freq: Three times a day (TID) | INTRAMUSCULAR | Status: DC

## 2011-04-23 MED ORDER — HYDROMORPHONE HCL PF 1 MG/ML IJ SOLN: 0.2500 mg | INTRAMUSCULAR | Status: DC | PRN

## 2011-04-23 MED ORDER — PHENYLEPHRINE HCL 10 MG/ML IJ SOLN
10.0000 mg | INTRAVENOUS | Status: DC | PRN
  Administered 2011-04-23: 25 ug/min via INTRAVENOUS

## 2011-04-23 MED ORDER — KETOROLAC TROMETHAMINE 30 MG/ML IJ SOLN
INTRAMUSCULAR | Status: AC
  Filled 2011-04-23: qty 1

## 2011-04-23 MED ORDER — PHENYLEPHRINE HCL 10 MG/ML IJ SOLN
INTRAMUSCULAR | Status: DC | PRN
  Administered 2011-04-23: 80 ug via INTRAVENOUS

## 2011-04-23 MED ORDER — OXYCODONE-ACETAMINOPHEN 5-325 MG PO TABS: 1.0000 | ORAL_TABLET | ORAL | Status: AC | PRN

## 2011-04-23 SURGICAL SUPPLY — 52 items
BAG DECANTER FOR FLEXI CONT (MISCELLANEOUS) ×2 IMPLANT
BLADE SURG 11 STRL SS (BLADE) ×2 IMPLANT
BLADE SURG 15 STRL LF DISP TIS (BLADE) ×1 IMPLANT
BLADE SURG 15 STRL SS (BLADE) ×1
BLADE SURG ROTATE 9660 (MISCELLANEOUS) IMPLANT
CANISTER SUCTION 2500CC (MISCELLANEOUS) ×2 IMPLANT
CLOTH BEACON ORANGE TIMEOUT ST (SAFETY) ×2 IMPLANT
CONT SPEC 4OZ CLIKSEAL STRL BL (MISCELLANEOUS) ×2 IMPLANT
CORDS BIPOLAR (ELECTRODE) ×2 IMPLANT
DECANTER SPIKE VIAL GLASS SM (MISCELLANEOUS) ×2 IMPLANT
DERMABOND ADVANCED (GAUZE/BANDAGES/DRESSINGS) ×1
DERMABOND ADVANCED .7 DNX12 (GAUZE/BANDAGES/DRESSINGS) ×1 IMPLANT
DRAPE C-ARM 42X72 X-RAY (DRAPES) ×2 IMPLANT
DRAPE LAPAROTOMY 100X72 PEDS (DRAPES) ×2 IMPLANT
DRAPE MICROSCOPE LEICA (MISCELLANEOUS) ×2 IMPLANT
DRAPE POUCH INSTRU U-SHP 10X18 (DRAPES) ×2 IMPLANT
DURAPREP 6ML APPLICATOR 50/CS (WOUND CARE) ×2 IMPLANT
ELECT BLADE 6.5 EXT (BLADE) ×2 IMPLANT
ELECT REM PT RETURN 9FT ADLT (ELECTROSURGICAL) ×2
ELECTRODE REM PT RTRN 9FT ADLT (ELECTROSURGICAL) ×1 IMPLANT
GAUZE SPONGE 4X4 16PLY XRAY LF (GAUZE/BANDAGES/DRESSINGS) IMPLANT
GLOVE BIO SURGEON STRL SZ7.5 (GLOVE) ×8 IMPLANT
GLOVE BIOGEL M 8.0 STRL (GLOVE) ×2 IMPLANT
GLOVE BIOGEL PI IND STRL 8.5 (GLOVE) ×3 IMPLANT
GLOVE BIOGEL PI INDICATOR 8.5 (GLOVE) ×3
GLOVE ECLIPSE 8.5 STRL (GLOVE) ×2 IMPLANT
GLOVE EXAM NITRILE LRG STRL (GLOVE) IMPLANT
GLOVE EXAM NITRILE MD LF STRL (GLOVE) ×2 IMPLANT
GLOVE EXAM NITRILE XL STR (GLOVE) IMPLANT
GLOVE EXAM NITRILE XS STR PU (GLOVE) IMPLANT
GLOVE INDICATOR 8.5 STRL (GLOVE) ×4 IMPLANT
GOWN BRE IMP SLV AUR LG STRL (GOWN DISPOSABLE) ×4 IMPLANT
GOWN BRE IMP SLV AUR XL STRL (GOWN DISPOSABLE) ×8 IMPLANT
GOWN STRL REIN 2XL LVL4 (GOWN DISPOSABLE) ×6 IMPLANT
KIT BASIN OR (CUSTOM PROCEDURE TRAY) ×2 IMPLANT
KIT ROOM TURNOVER OR (KITS) ×2 IMPLANT
NEEDLE HYPO 18GX1.5 BLUNT FILL (NEEDLE) IMPLANT
NEEDLE HYPO 22GX1.5 SAFETY (NEEDLE) ×2 IMPLANT
NEEDLE SPNL 20GX3.5 QUINCKE YW (NEEDLE) IMPLANT
NS IRRIG 1000ML POUR BTL (IV SOLUTION) ×2 IMPLANT
PACK LAMINECTOMY NEURO (CUSTOM PROCEDURE TRAY) ×2 IMPLANT
PAD ARMBOARD 7.5X6 YLW CONV (MISCELLANEOUS) ×6 IMPLANT
RUBBERBAND STERILE (MISCELLANEOUS) IMPLANT
SLEEVE SURGEON STRL (DRAPES) ×2 IMPLANT
SPONGE SURGIFOAM ABS GEL SZ50 (HEMOSTASIS) ×2 IMPLANT
SUT VIC AB 3-0 SH 8-18 (SUTURE) ×2 IMPLANT
SYR 20ML ECCENTRIC (SYRINGE) ×2 IMPLANT
SYR 5ML LL (SYRINGE) IMPLANT
TOWEL OR 17X24 6PK STRL BLUE (TOWEL DISPOSABLE) ×2 IMPLANT
TOWEL OR 17X26 10 PK STRL BLUE (TOWEL DISPOSABLE) ×2 IMPLANT
WATER STERILE IRR 1000ML POUR (IV SOLUTION) ×2 IMPLANT
WIRE TIP MIS 2.5MM NEURO (BURR) IMPLANT

## 2011-04-23 NOTE — Progress Notes (Signed)
Late entry.Marland KitchenMarland KitchenPatient received from neuro pacu at 1800.  VSS upon arrival.  Patient voids upon arrival.  Posterior incision closed with dermabond is CDI. Patient denies pain.

## 2011-04-23 NOTE — Anesthesia Preprocedure Evaluation (Addendum)
Anesthesia Evaluation  Patient identified by MRN, date of birth, ID band  Reviewed: Allergy & Precautions, H&P , NPO status , Patient's Chart, lab work & pertinent test results  Airway Mallampati: II TM Distance: <3 FB     Dental  (+) Teeth Intact and Dental Advisory Given   Pulmonary neg pulmonary ROS,          Cardiovascular hypertension, Pt. on medications     Neuro/Psych    GI/Hepatic Neg liver ROS, GERD-  Controlled,  Endo/Other  Negative Endocrine ROS  Renal/GU negative Renal ROS     Musculoskeletal   Abdominal   Peds  Hematology negative hematology ROS (+)   Anesthesia Other Findings   Reproductive/Obstetrics                         Anesthesia Physical Anesthesia Plan  ASA: III  Anesthesia Plan: General   Post-op Pain Management:    Induction: Intravenous  Airway Management Planned:   Additional Equipment:   Intra-op Plan:   Post-operative Plan: Extubation in OR  Informed Consent: I have reviewed the patients History and Physical, chart, labs and discussed the procedure including the risks, benefits and alternatives for the proposed anesthesia with the patient or authorized representative who has indicated his/her understanding and acceptance.   Dental advisory given  Plan Discussed with: CRNA, Anesthesiologist and Surgeon  Anesthesia Plan Comments:        Anesthesia Quick Evaluation

## 2011-04-23 NOTE — Op Note (Signed)
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 Assistant: Hilda Lias M.D. Anesthesia: Gen. endotracheal Indications: Patient is a 71 year old male who has had significant problems with neck and shoulder pain involving the right arm. An MRI demonstrates a large extruded fragment of disc at C4-C5. 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 Because of the pain and weakness he has been advised regarding the need for surgical decompression of the C5 nerve root on the right side.   Procedure: Patient was brought to the operating room supine on a stretcher having had central venous monitoring catheter in appropriate other lines placed in the preoperative holding area. After the smooth induction of general endotracheal anesthesia the patient was placed in a 3 pin headrest and then carefully placed into the seated position the back of the neck exposed. Fluoroscopy imaging was brought into the cross table lateral position to view the vertebrae of the cervical spine. Back of the neck was cleansed with alcohol and DuraPrep and draped in a sterile fashion. Localizing radiographs with a small 22-gauge needle identifying the surface anatomy was used to localize the appropriate level. C4-C5 Was identified positively on the radiograph and correlated with fluoroscopy. Skin in this area was infiltrated with 10 cc of 1% lidocaine mixed 50-50 with half percent Marcaine and 1-100,000 epinephrine. A small vertical incision was created over this area and a K wire was then passed to the interlaminar space of C4-C5. A wanting technique a series of dilators was used to create an opening down to the  interlaminar space. Ultimately a 5 cm centimeter by 18 mm diameter cannula was placed in the wound and fixed to the operating table with a clamp. The operating microscope was brought into the field and through the aperture soft tissues above the interlaminar space of C4-C5 were clean. The inferior margin of the lamina of C4 and the superior margin of the lamina and facet complex of C4-C5 were then removed with 2.2 mm high-speed bur. Soft tissues in this area were then cauterized and divided and this identified the path of the C5 nerve root. This was carefully dissected and by working from underneath the nerve root a significant mass of tissue was identified. When this was incised with a #11 blade under moderate pressure some fragments of this extruded themselves. These were removed with a micropituitary rongeur. Further dissection yielded other fragments of disc which were similarly removed. Dissection over the shoulder of the nerve was then performed and this yielded further fragments of disc. This procedure continued from above and below the exiting nerve root until all fragments were removed. The stasis was then carefully obtained with some small pledgets of Gelfoam soaked in thrombin which were then tear gated away. When adequate decompression was completed the area was inspected for hemostasis yet again and then the endoscopic cannula was removed and the fascia and the lung was closed with 3-0 Vicryl in interrupted fashion and the skin was closed with 3-0 Vicryl in an inverted interrupted fashion. Dermabond was placed on the skin blood loss was 20.

## 2011-04-23 NOTE — Anesthesia Procedure Notes (Addendum)
Procedure Name: Intubation Date/Time: 04/23/2011 11:46 AM Performed by: Malachi Pro Pre-anesthesia Checklist: Patient identified, Emergency Drugs available, Suction available, Patient being monitored and Timeout performed Patient Re-evaluated:Patient Re-evaluated prior to inductionOxygen Delivery Method: Circle System Utilized Preoxygenation: Pre-oxygenation with 100% oxygen Intubation Type: IV induction Ventilation: Mask ventilation without difficulty Laryngoscope Size: Mac and 3 Grade View: Grade I Tube type: Oral Tube size: 7.5 mm Number of attempts: 1 Airway Equipment and Method: stylet Placement Confirmation: ETT inserted through vocal cords under direct vision,  positive ETCO2 and breath sounds checked- equal and bilateral Secured at: 23 cm Tube secured with: Tape Dental Injury: Teeth and Oropharynx as per pre-operative assessment    1045-1100: The patient was identified and consent obtained.  TO was performed, and full barrier precautions were used.  The skin was anesthetized with lidocaine 4cc plain with 25 g needle. Dual lumen CVP catheter utilized. Once the vein was located with the 22 ga. needle using ultrasound guidance , the wire was inserted into the vein.  The wire location was confirmed with ultrasound.  The tissue was dilated and the catheter was carefully inserted, then sutured in place. A dressing was applied. There was no difficulty. The patient tolerated the procedure well.  CXR was ordered for PACU.

## 2011-04-23 NOTE — Transfer of Care (Signed)
Immediate Anesthesia Transfer of Care Note  Patient: Daniel Wagner  Procedure(s) Performed:  POSTERIOR CERVICAL LAMINECTOMY WITH MET- RX - Right Cervical four- five  "Sitting" Diskectomy with Metrex  Patient Location: PACU  Anesthesia Type: General  Level of Consciousness: awake and oriented  Airway & Oxygen Therapy: Patient Spontanous Breathing and Patient connected to face mask oxygen  Post-op Assessment: Report given to PACU RN, Post -op Vital signs reviewed and stable and Patient moving all extremities X 4  Post vital signs: Reviewed and stable Filed Vitals:   04/23/11 0817  BP: 143/77  Pulse: 69  Temp: 36.6 C  Resp: 18    Complications: No apparent anesthesia complications

## 2011-04-23 NOTE — Preoperative (Signed)
Beta Blockers   Reason not to administer Beta Blockers:Not Applicable2 

## 2011-04-23 NOTE — H&P (Signed)
Larose Kells. Kutzer  #[id number]  DOB:  01/13/1941  04/21/2011:  Mr. Ivey returns to the office today having had an MRI of the cervical spine.  The MRI demonstrates a large extruded fragment of disc at C4-C5 eccentric to the right side.  This effaces the ventral aspect of the cord and definitely compresses the C5 nerve root.  Indeed, Mr. Cutbirth has been experiencing weakness in the deltoid region on that right side, in addition to severe radicular pain in the C5 distribution.    Today in the office I did some plain flexion and extension films and Mr. Mchargue has extensive spondylosis at C3-4, C5-6, C6-7 with a marked amount of cervical straightening.  C4-5 actually appears to be a fairly well preserved disc space.  However, this is the level where he does have the disc herniation.    I indicated to Mr. and Mrs. Crescenzo that if surgery is contemplated, I would advocate to do a simple diskectomy in the lateral recess to decompress the nerve root and the canal, but leave the joint intact.  I would like to do this because this is, indeed, a fairly functional and healthy looking joint otherwise and compared to the other more spondylitic joints likely serves a functional purpose in terms of preserving mobility.  Mr. Hodapp is agreeable.  I indicated that the surgery would be done via a METRX procedure in the seated position at C4-5 on the right side.  Hospital stay is at most an overnight and often time if we do these in the morning, patients can go home the same day.  We will plan the surgery at the earliest possible convenience.  I do note that today he has 3/5 strength in the deltoid on that right side compared to the left.  Though he feels it is somewhat better, it is still far lacking in terms of normal strength.          Stefani Dama, M.D./sv NEUROSURGICAL CONSULTATION  Larose Kells. Hershey  #[id number]  DOB:  15-Jun-1940     December 17, 2008   Initial office visit.  Referred by Dr. Simone Curia.    HISTORY:     Jvion Turgeon is a  71 year old, retired gentleman who comes in for evaluation of neck pain and left-sided posterior shoulder pain.  It has been going on since January of 2010.  He has no radicular pain in his arms.  He does have neck pain.  No leg pain and no back pain.  He has some numbness and tingling in the left side of his shoulder on the posterior aspect.  No bowel or bladder problems.  He occasionally has headaches.  No significant history of convulsion, seizures or loss of consciousness.  No previous surgery on his neck or back.  He has not had any imaging studies.    PAST MEDICAL HISTORY:  Positive for hypertension.    FAMILY HISTORY:    Mother and father are both deceased at age 17 and 30.    PAST SURGICAL HISTORY:  Cholecystectomy in 1998, sinus surgery in 2003.    DRUG ALLERGIES:   Drug allergy to Aspirin causes nervousness.    SOCIAL HISTORY:    Negative tobacco use.  One beer socially.  No history of substance abuse.  6', 2" and 190 lbs.    REVIEW OF SYSTEMS x 14 is positive for sinus problems, sinus headache, hypertension, hypercholesterolemia, arthritis pain, skin cancer.    MEDICATIONS:    Lisinopril, Doxazosin, Tramadol and  Relafen.    PHYSICAL EXAMINATION:  Kowen Kluth is a well-developed, well-nourished, 71 year old gentleman alert and oriented x 3.  He ambulates with a normal gait.  Full range of motion in the bilateral upper extremities.  Good strength to grip, intrinsics, finger extension, wrist extension, biceps, triceps, deltoid.  Deep tendon reflexes +1 symmetrical brachioradialis, biceps and triceps.  He has some posterior periscapular and left posterior shoulder trigger points, cervical spine pain centrally, pain with extension and rotation of the right and left, some stiffness and loss of motion by about 30%  and to rotation to the right and left goes to about 50 degrees.  Some pain with Spurlings'.  Negative clonus.  Negative Hoffmann's.  No long track signs.   DIAGNOSTIC  STUDIES:   Radiographs show degenerative disc disease C5-6, C6-7, C7-T1.  Loss of disc height and some straightening and loss of lordosis at these levels.  Otherwise, disc height is fairly well-maintained.    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    PLAN:      Due to the duration of the symptoms, the fact that he is getting worse and more symptomatic over the last few months, we would like to obtain an MRI scan to evaluate for compression of the neural elements, spinal stenosis and the condition of his disc spaces.  We will see him back after the MRI, come up with a definitive treatment plan.    All questions encouraged, answered and addressed.  The pathology was reviewed using a model, his x-rays and his physical exam.  The patient is seen today by Hardin Negus, PA-C in the office.  This will be reviewed with Dr. Danielle Dess and we will hopefully afford this gentleman some relief of his pain when we come up with a definitive treatment plan.    [facility]                                                     Hardin Negus, PA-C Supervised by Sherilyn Cooter

## 2011-04-23 NOTE — Anesthesia Postprocedure Evaluation (Signed)
  Anesthesia Post-op Note  Patient: Daniel Wagner  Procedure(s) Performed:  POSTERIOR CERVICAL LAMINECTOMY WITH MET- RX - Right Cervical four- five  "Sitting" Diskectomy with Metrex  Patient Location: PACU  Anesthesia Type: General  Level of Consciousness: awake  Airway and Oxygen Therapy: Patient Spontanous Breathing  Post-op Pain: mild  Post-op Assessment: Post-op Vital signs reviewed  Post-op Vital Signs: stable  Complications: No apparent anesthesia complications

## 2011-04-23 NOTE — H&P (Signed)
Daniel Wagner is an 71 y.o. male.   Chief Complaint: Right arm pain, weakness, neck pain HPI: Patient is a 71 year old individual is had significant weakness in the deltoid and right shoulder musculature secondary to a large extruded fragment of disc at C4-C5 on the right side. Now admitted to undergo surgical resection of this mass.  Past Medical History  Diagnosis Date  . Hypertension   . GERD (gastroesophageal reflux disease) Hx in past.    Past Surgical History  Procedure Date  . Back surgery 08/2010  . Neck surgery   . Cholecystectomy 1990's  . Transurethral resection of prostate 1999  . Sinus surgeries     History reviewed. No pertinent family history. Social History:  does not have a smoking history on file. He does not have any smokeless tobacco history on file. His alcohol and drug histories not on file.  Allergies:  Allergies  Allergen Reactions  . Aspirin     REACTION: "jittery"    Medications Prior to Admission  Medication Dose Route Frequency Provider Last Rate Last Dose  . bacitracin 16109 UNITS injection           . ceFAZolin (ANCEF) IVPB 1 g/50 mL premix  1 g Intravenous 60 min Pre-Op Stefani Dama      . fentaNYL (SUBLIMAZE) 0.05 MG/ML injection           . fentaNYL (SUBLIMAZE) injection 50-100 mcg  50-100 mcg Intravenous PRN Judie Petit, MD   25 mcg at 04/23/11 1050  . HYDROmorphone (DILAUDID) injection 0.25-0.5 mg  0.25-0.5 mg Intravenous Q5 min PRN Judie Petit, MD      . meperidine (DEMEROL) injection 6.25-12.5 mg  6.25-12.5 mg Intravenous Q5 min PRN Judie Petit, MD      . midazolam (VERSED) 2 MG/2ML injection           . midazolam (VERSED) injection 1-2 mg  1-2 mg Intravenous PRN Judie Petit, MD   0.5 mg at 04/23/11 1051  . morphine 2 MG/ML injection 4.2 mg  0.05 mg/kg Intravenous Q10 min PRN Judie Petit, MD      . promethazine (PHENERGAN) injection 6.25-12.5 mg  6.25-12.5 mg Intravenous Q15 min PRN Judie Petit, MD      . sodium  chloride 0.9 % infusion            No current outpatient prescriptions on file as of 04/23/2011.    Results for orders placed during the hospital encounter of 04/23/11 (from the past 48 hour(s))  COMPREHENSIVE METABOLIC PANEL     Status: Abnormal   Collection Time   04/23/11  8:33 AM      Component Value Range Comment   Sodium 141  135 - 145 (mEq/L)    Potassium 4.5  3.5 - 5.1 (mEq/L)    Chloride 102  96 - 112 (mEq/L)    CO2 30  19 - 32 (mEq/L)    Glucose, Bld 82  70 - 99 (mg/dL)    BUN 19  6 - 23 (mg/dL)    Creatinine, Ser 6.04  0.50 - 1.35 (mg/dL)    Calcium 9.4  8.4 - 10.5 (mg/dL)    Total Protein 6.8  6.0 - 8.3 (g/dL)    Albumin 3.9  3.5 - 5.2 (g/dL)    AST 25  0 - 37 (U/L)    ALT 26  0 - 53 (U/L)    Alkaline Phosphatase 46  39 - 117 (U/L)    Total Bilirubin 0.6  0.3 - 1.2 (  mg/dL)    GFR calc non Af Amer 84 (*) >90 (mL/min)    GFR calc Af Amer >90  >90 (mL/min)   CBC     Status: Abnormal   Collection Time   04/23/11  8:33 AM      Component Value Range Comment   WBC 10.6 (*) 4.0 - 10.5 (K/uL)    RBC 4.48  4.22 - 5.81 (MIL/uL)    Hemoglobin 14.7  13.0 - 17.0 (g/dL)    HCT 16.1  09.6 - 04.5 (%)    MCV 96.9  78.0 - 100.0 (fL)    MCH 32.8  26.0 - 34.0 (pg)    MCHC 33.9  30.0 - 36.0 (g/dL)    RDW 40.9  81.1 - 91.4 (%)    Platelets 185  150 - 400 (K/uL)    No results found.  Review of Systems  Constitutional: Negative.   HENT: Positive for neck pain.   Eyes: Negative.   Respiratory: Negative.   Cardiovascular: Negative.   Gastrointestinal: Negative.   Genitourinary: Negative.   Musculoskeletal: Positive for myalgias.  Skin: Negative.   Neurological: Positive for tingling and focal weakness.  Endo/Heme/Allergies: Negative.   Psychiatric/Behavioral: Negative.     Blood pressure 143/77, pulse 69, temperature 97.8 F (36.6 C), temperature source Oral, resp. rate 18, height 6\' 2"  (1.88 m), weight 83.915 kg (185 lb), SpO2 98.00%. Physical Exam deltoid strength is 4  minus out of 5 in his palpation supravesicular fossa tenderness palpation over the trapezius and supraspinatus musculature on the right side. Biceps strength is 4/5 grip strength is 5 out of 5 intrinsics are normal triceps strength is normal bilaterally. Cranial nerve examination is within the limits of normal the heart has a regular rate and rhythm no murmurs are heard lungs are clear to auscultation abdomen soft bowel sounds positive no masses are noted extremities reveal no cyanosis clubbing or edema.  Assessment/Plan Herniated nucleus pulposus C4-5 right with right cervical radiculopathy. Met-rx discectomy C4-5 right  Clive Parcel J 04/23/2011, 11:19 AM

## 2012-05-16 ENCOUNTER — Other Ambulatory Visit: Payer: Self-pay | Admitting: Neurological Surgery

## 2012-05-16 DIAGNOSIS — M542 Cervicalgia: Secondary | ICD-10-CM

## 2012-05-21 ENCOUNTER — Ambulatory Visit
Admission: RE | Admit: 2012-05-21 | Discharge: 2012-05-21 | Disposition: A | Discharge status: Home / Self Care | Payer: Medicare Other | Source: Ambulatory Visit | Attending: Neurological Surgery | Admitting: Neurological Surgery

## 2012-05-21 DIAGNOSIS — M542 Cervicalgia: Secondary | ICD-10-CM

## 2012-05-22 ENCOUNTER — Other Ambulatory Visit: Payer: Self-pay | Admitting: Neurological Surgery

## 2012-06-19 ENCOUNTER — Encounter (HOSPITAL_COMMUNITY): Payer: Self-pay | Admitting: Pharmacy Technician

## 2012-06-22 NOTE — Pre-Procedure Instructions (Signed)
Lesslie Mckeehan  06/22/2012   Your procedure is scheduled on:  Friday, March 21st.  Report to [facility] at 5:30AM.  Call this number if you have problems the morning of surgery: [phone]   Remember:   Do not eat food or drink liquids after midnight.   Take these medicines the morning of surgery with A SIP OF WATER: Doxazosin (cardura), Flonase nasal spray.  May take Hydrocodone- Acetaminiophen if needed.   Stop taking Aspirin, Coumadin, Plavix, Effient and Herbal medications.  Do not take any NSAIDs ie: Ibuprofen,  Advil,Naproxen or any medication containing Aspirin.    Do not wear jewelry, make-up or nail polish.  Do not wear lotions, powders, or perfumes. You may wear deodorant.             Men may shave face and neck.  Do not bring valuables to the hospital.  Contacts, dentures or bridgework may not be worn into surgery.  Leave suitcase in the car. After surgery it may be brought to your room.  For patients admitted to the hospital, checkout time is 11:00 AM the day of  discharge.   Patients discharged the day of surgery will not be allowed to drive  home.  Name and phone number of your driver: -   Special Instructions: Shower using CHG 2 nights before surgery and the night before surgery.  If you shower the day of surgery use CHG.  Use special wash - you have one bottle of CHG for all showers.  You should use approximately 1/3 of the bottle for each shower.   Please read over the following fact sheets that you were given: Pain Booklet, Coughing and Deep Breathing and Surgical Site Infection Prevention

## 2012-06-23 ENCOUNTER — Encounter (HOSPITAL_COMMUNITY)
Admission: RE | Admit: 2012-06-23 | Discharge: 2012-06-23 | Disposition: A | Discharge status: Home / Self Care | Payer: Medicare Other | Source: Ambulatory Visit | Attending: Anesthesiology | Admitting: Anesthesiology

## 2012-06-23 ENCOUNTER — Encounter (HOSPITAL_COMMUNITY)
Admission: RE | Admit: 2012-06-23 | Discharge: 2012-06-23 | Disposition: A | Discharge status: Home / Self Care | Payer: Medicare Other | Source: Ambulatory Visit | Attending: Neurological Surgery | Admitting: Neurological Surgery

## 2012-06-23 ENCOUNTER — Encounter (HOSPITAL_COMMUNITY): Payer: Self-pay

## 2012-06-23 HISTORY — DX: Malignant (primary) neoplasm, unspecified: C80.1

## 2012-06-23 HISTORY — DX: Unspecified osteoarthritis, unspecified site: M19.90

## 2012-06-23 LAB — CBC
MCV: 91.8 fL (ref 78.0–100.0)
Platelets: 189 10*3/uL (ref 150–400)
RBC: 4.27 MIL/uL (ref 4.22–5.81)
RDW: 13 % (ref 11.5–15.5)
WBC: 7 10*3/uL (ref 4.0–10.5)

## 2012-06-23 LAB — BASIC METABOLIC PANEL
CO2: 23 mEq/L (ref 19–32)
Calcium: 9.2 mg/dL (ref 8.4–10.5)
Creatinine, Ser: 0.98 mg/dL (ref 0.50–1.35)
GFR calc non Af Amer: 82 mL/min — ABNORMAL LOW (ref >90)
Sodium: 134 mEq/L — ABNORMAL LOW (ref 135–145)

## 2012-06-23 LAB — SURGICAL PCR SCREEN
MRSA, PCR: NEGATIVE
Staphylococcus aureus: NEGATIVE

## 2012-06-29 MED ORDER — CEFAZOLIN SODIUM-DEXTROSE 2-3 GM-% IV SOLR
2.0000 g | INTRAVENOUS | Status: AC
  Administered 2012-06-30: 2 g via INTRAVENOUS
  Filled 2012-06-29: qty 50

## 2012-06-30 ENCOUNTER — Inpatient Hospital Stay (HOSPITAL_COMMUNITY): Payer: Medicare Other

## 2012-06-30 ENCOUNTER — Encounter (HOSPITAL_COMMUNITY): Payer: Self-pay | Admitting: Anesthesiology

## 2012-06-30 ENCOUNTER — Inpatient Hospital Stay (HOSPITAL_COMMUNITY)
Admission: RE | Admit: 2012-06-30 | Discharge: 2012-07-01 | DRG: 472 | Disposition: A | Discharge status: Home / Self Care | Payer: Medicare Other | Source: Ambulatory Visit | Attending: Neurological Surgery | Admitting: Neurological Surgery

## 2012-06-30 ENCOUNTER — Inpatient Hospital Stay (HOSPITAL_COMMUNITY): Payer: Medicare Other | Admitting: Anesthesiology

## 2012-06-30 ENCOUNTER — Encounter (HOSPITAL_COMMUNITY): Payer: Self-pay | Admitting: *Deleted

## 2012-06-30 ENCOUNTER — Encounter (HOSPITAL_COMMUNITY)
Admission: RE | Disposition: A | Discharge status: Home / Self Care | Payer: Self-pay | Source: Ambulatory Visit | Attending: Neurological Surgery

## 2012-06-30 DIAGNOSIS — Z01812 Encounter for preprocedural laboratory examination: Secondary | ICD-10-CM

## 2012-06-30 DIAGNOSIS — Z981 Arthrodesis status: Secondary | ICD-10-CM

## 2012-06-30 DIAGNOSIS — M4000 Postural kyphosis, site unspecified: Principal | ICD-10-CM | POA: Diagnosis present

## 2012-06-30 DIAGNOSIS — Z85828 Personal history of other malignant neoplasm of skin: Secondary | ICD-10-CM

## 2012-06-30 DIAGNOSIS — M5000 Cervical disc disorder with myelopathy, unspecified cervical region: Secondary | ICD-10-CM

## 2012-06-30 DIAGNOSIS — I10 Essential (primary) hypertension: Secondary | ICD-10-CM | POA: Diagnosis present

## 2012-06-30 DIAGNOSIS — Z01818 Encounter for other preprocedural examination: Secondary | ICD-10-CM

## 2012-06-30 DIAGNOSIS — K219 Gastro-esophageal reflux disease without esophagitis: Secondary | ICD-10-CM | POA: Diagnosis present

## 2012-06-30 DIAGNOSIS — M4712 Other spondylosis with myelopathy, cervical region: Secondary | ICD-10-CM | POA: Diagnosis present

## 2012-06-30 DIAGNOSIS — Z01810 Encounter for preprocedural cardiovascular examination: Secondary | ICD-10-CM

## 2012-06-30 HISTORY — PX: ANTERIOR CERVICAL DECOMPRESSION/DISCECTOMY FUSION 4 LEVELS: SHX5556

## 2012-06-30 SURGERY — ANTERIOR CERVICAL DECOMPRESSION/DISCECTOMY FUSION 4 LEVELS
Anesthesia: General | Site: Neck | Wound class: Clean

## 2012-06-30 MED ORDER — FLUTICASONE PROPIONATE 50 MCG/ACT NA SUSP
2.0000 | Freq: Every day | NASAL | Status: DC | PRN
  Filled 2012-06-30: qty 16

## 2012-06-30 MED ORDER — NEOSTIGMINE METHYLSULFATE 1 MG/ML IJ SOLN
INTRAMUSCULAR | Status: DC | PRN
  Administered 2012-06-30: 5 mg via INTRAVENOUS

## 2012-06-30 MED ORDER — SODIUM CHLORIDE 0.9 % IR SOLN
Status: DC | PRN
  Administered 2012-06-30: 09:00:00

## 2012-06-30 MED ORDER — SIMVASTATIN 10 MG PO TABS
10.0000 mg | ORAL_TABLET | Freq: Every day | ORAL | Status: DC
  Administered 2012-06-30: 10 mg via ORAL
  Filled 2012-06-30 (×2): qty 1

## 2012-06-30 MED ORDER — LIDOCAINE HCL (CARDIAC) 20 MG/ML IV SOLN
INTRAVENOUS | Status: DC | PRN
  Administered 2012-06-30: 80 mg via INTRAVENOUS
  Administered 2012-06-30: 50 mg via INTRAVENOUS

## 2012-06-30 MED ORDER — 0.9 % SODIUM CHLORIDE (POUR BTL) OPTIME
TOPICAL | Status: DC | PRN
  Administered 2012-06-30: 1000 mL

## 2012-06-30 MED ORDER — MENTHOL 3 MG MT LOZG: 1.0000 | LOZENGE | OROMUCOSAL | Status: DC | PRN

## 2012-06-30 MED ORDER — PHENOL 1.4 % MT LIQD: 1.0000 | OROMUCOSAL | Status: DC | PRN

## 2012-06-30 MED ORDER — DEXAMETHASONE SODIUM PHOSPHATE 4 MG/ML IJ SOLN
INTRAMUSCULAR | Status: DC | PRN
  Administered 2012-06-30: 10 mg via INTRAVENOUS

## 2012-06-30 MED ORDER — SODIUM CHLORIDE 0.9 % IV SOLN
INTRAVENOUS | Status: AC
  Filled 2012-06-30: qty 500

## 2012-06-30 MED ORDER — ONDANSETRON HCL 4 MG/2ML IJ SOLN: 4.0000 mg | INTRAMUSCULAR | Status: DC | PRN

## 2012-06-30 MED ORDER — FENTANYL CITRATE 0.05 MG/ML IJ SOLN
INTRAMUSCULAR | Status: DC | PRN
  Administered 2012-06-30 (×2): 100 ug via INTRAVENOUS
  Administered 2012-06-30 (×2): 50 ug via INTRAVENOUS
  Administered 2012-06-30: 100 ug via INTRAVENOUS
  Administered 2012-06-30: 50 ug via INTRAVENOUS
  Administered 2012-06-30: 100 ug via INTRAVENOUS

## 2012-06-30 MED ORDER — VITAMIN C 500 MG PO TABS
500.0000 mg | ORAL_TABLET | Freq: Every day | ORAL | Status: DC
  Administered 2012-06-30: 500 mg via ORAL
  Filled 2012-06-30 (×2): qty 1

## 2012-06-30 MED ORDER — HYDROCODONE-ACETAMINOPHEN 5-325 MG PO TABS
1.0000 | ORAL_TABLET | Freq: Two times a day (BID) | ORAL | Status: DC | PRN
  Administered 2012-07-01: 2 via ORAL
  Filled 2012-06-30: qty 2

## 2012-06-30 MED ORDER — BACITRACIN 50000 UNITS IM SOLR
INTRAMUSCULAR | Status: AC
  Filled 2012-06-30: qty 1

## 2012-06-30 MED ORDER — SODIUM CHLORIDE 0.9 % IV SOLN
10.0000 mg | INTRAVENOUS | Status: DC | PRN
  Administered 2012-06-30: 15 ug/min via INTRAVENOUS

## 2012-06-30 MED ORDER — DEXTROSE 5 % IV SOLN
INTRAVENOUS | Status: DC | PRN
  Administered 2012-06-30: 08:00:00 via INTRAVENOUS

## 2012-06-30 MED ORDER — CEFAZOLIN SODIUM 1-5 GM-% IV SOLN
1.0000 g | Freq: Three times a day (TID) | INTRAVENOUS | Status: AC
  Administered 2012-06-30 – 2012-07-01 (×2): 1 g via INTRAVENOUS
  Filled 2012-06-30 (×2): qty 50

## 2012-06-30 MED ORDER — HYDROMORPHONE HCL PF 1 MG/ML IJ SOLN
INTRAMUSCULAR | Status: AC
  Filled 2012-06-30: qty 1

## 2012-06-30 MED ORDER — BUPIVACAINE HCL (PF) 0.5 % IJ SOLN
INTRAMUSCULAR | Status: DC | PRN
  Administered 2012-06-30: 7 mL

## 2012-06-30 MED ORDER — VECURONIUM BROMIDE 10 MG IV SOLR
INTRAVENOUS | Status: DC | PRN
  Administered 2012-06-30: 4 mg via INTRAVENOUS
  Administered 2012-06-30: 2 mg via INTRAVENOUS

## 2012-06-30 MED ORDER — GLYCOPYRROLATE 0.2 MG/ML IJ SOLN
INTRAMUSCULAR | Status: DC | PRN
  Administered 2012-06-30: 0.6 mg via INTRAVENOUS
  Administered 2012-06-30 (×2): 0.2 mg via INTRAVENOUS

## 2012-06-30 MED ORDER — ARTIFICIAL TEARS OP OINT
TOPICAL_OINTMENT | OPHTHALMIC | Status: DC | PRN
  Administered 2012-06-30: 1 via OPHTHALMIC

## 2012-06-30 MED ORDER — LACTATED RINGERS IV SOLN
INTRAVENOUS | Status: DC | PRN
  Administered 2012-06-30 (×3): via INTRAVENOUS

## 2012-06-30 MED ORDER — DIAZEPAM 5 MG PO TABS
ORAL_TABLET | ORAL | Status: AC
  Filled 2012-06-30: qty 1

## 2012-06-30 MED ORDER — ONDANSETRON HCL 4 MG/2ML IJ SOLN
INTRAMUSCULAR | Status: DC | PRN
  Administered 2012-06-30 (×2): 4 mg via INTRAVENOUS

## 2012-06-30 MED ORDER — ROCURONIUM BROMIDE 100 MG/10ML IV SOLN
INTRAVENOUS | Status: DC | PRN
  Administered 2012-06-30: 50 mg via INTRAVENOUS

## 2012-06-30 MED ORDER — DIAZEPAM 5 MG PO TABS
5.0000 mg | ORAL_TABLET | Freq: Four times a day (QID) | ORAL | Status: DC | PRN
  Administered 2012-06-30 – 2012-07-01 (×2): 5 mg via ORAL
  Filled 2012-06-30: qty 1

## 2012-06-30 MED ORDER — OXYCODONE-ACETAMINOPHEN 5-325 MG PO TABS
1.0000 | ORAL_TABLET | ORAL | Status: DC | PRN
  Administered 2012-06-30 – 2012-07-01 (×4): 2 via ORAL
  Filled 2012-06-30 (×2): qty 2

## 2012-06-30 MED ORDER — MEPERIDINE HCL 25 MG/ML IJ SOLN: 6.2500 mg | INTRAMUSCULAR | Status: DC | PRN

## 2012-06-30 MED ORDER — EPHEDRINE SULFATE 50 MG/ML IJ SOLN
INTRAMUSCULAR | Status: DC | PRN
  Administered 2012-06-30 (×3): 10 mg via INTRAVENOUS

## 2012-06-30 MED ORDER — THROMBIN 5000 UNITS EX SOLR
OROMUCOSAL | Status: DC | PRN
  Administered 2012-06-30: 09:00:00 via TOPICAL

## 2012-06-30 MED ORDER — SODIUM CHLORIDE 0.9 % IJ SOLN
3.0000 mL | Freq: Two times a day (BID) | INTRAMUSCULAR | Status: DC
  Administered 2012-06-30 (×2): 3 mL via INTRAVENOUS

## 2012-06-30 MED ORDER — SODIUM CHLORIDE 0.9 % IJ SOLN: 3.0000 mL | INTRAMUSCULAR | Status: DC | PRN

## 2012-06-30 MED ORDER — SODIUM CHLORIDE 0.9 % IV SOLN
250.0000 mL | INTRAVENOUS | Status: DC
  Administered 2012-06-30: 250 mL via INTRAVENOUS

## 2012-06-30 MED ORDER — ACETAMINOPHEN 325 MG PO TABS: 650.0000 mg | ORAL_TABLET | ORAL | Status: DC | PRN

## 2012-06-30 MED ORDER — MIDAZOLAM HCL 5 MG/5ML IJ SOLN
INTRAMUSCULAR | Status: DC | PRN
  Administered 2012-06-30: 2 mg via INTRAVENOUS

## 2012-06-30 MED ORDER — ACETAMINOPHEN 650 MG RE SUPP: 650.0000 mg | RECTAL | Status: DC | PRN

## 2012-06-30 MED ORDER — PROMETHAZINE HCL 25 MG/ML IJ SOLN: 6.2500 mg | INTRAMUSCULAR | Status: DC | PRN

## 2012-06-30 MED ORDER — LIDOCAINE-EPINEPHRINE 1 %-1:100000 IJ SOLN
INTRAMUSCULAR | Status: DC | PRN
  Administered 2012-06-30: 7 mL

## 2012-06-30 MED ORDER — DOXAZOSIN MESYLATE 4 MG PO TABS
4.0000 mg | ORAL_TABLET | Freq: Every day | ORAL | Status: DC
  Filled 2012-06-30 (×2): qty 1

## 2012-06-30 MED ORDER — PROPOFOL 10 MG/ML IV BOLUS
INTRAVENOUS | Status: DC | PRN
  Administered 2012-06-30: 160 mg via INTRAVENOUS

## 2012-06-30 MED ORDER — ACETAMINOPHEN 10 MG/ML IV SOLN
INTRAVENOUS | Status: AC
  Administered 2012-06-30: 1000 mg via INTRAVENOUS
  Filled 2012-06-30: qty 100

## 2012-06-30 MED ORDER — LISINOPRIL 20 MG PO TABS
20.0000 mg | ORAL_TABLET | Freq: Every evening | ORAL | Status: DC
  Administered 2012-06-30: 20 mg via ORAL
  Filled 2012-06-30 (×2): qty 1

## 2012-06-30 MED ORDER — COQ10 200 MG PO CAPS: 200.0000 mg | ORAL_CAPSULE | Freq: Two times a day (BID) | ORAL | Status: DC

## 2012-06-30 MED ORDER — MORPHINE SULFATE 2 MG/ML IJ SOLN
1.0000 mg | INTRAMUSCULAR | Status: DC | PRN
  Administered 2012-06-30: 2 mg via INTRAVENOUS
  Administered 2012-06-30: 4 mg via INTRAVENOUS
  Filled 2012-06-30: qty 2
  Filled 2012-06-30: qty 1

## 2012-06-30 MED ORDER — THROMBIN 20000 UNITS EX SOLR
CUTANEOUS | Status: DC | PRN
  Administered 2012-06-30: 09:00:00 via TOPICAL

## 2012-06-30 MED ORDER — HYDROMORPHONE HCL PF 1 MG/ML IJ SOLN
0.2500 mg | INTRAMUSCULAR | Status: DC | PRN
  Administered 2012-06-30 (×3): 0.5 mg via INTRAVENOUS

## 2012-06-30 MED ORDER — OXYCODONE-ACETAMINOPHEN 5-325 MG PO TABS
ORAL_TABLET | ORAL | Status: AC
  Filled 2012-06-30: qty 2

## 2012-06-30 MED ORDER — ALPRAZOLAM 0.25 MG PO TABS: 0.2500 mg | ORAL_TABLET | Freq: Every day | ORAL | Status: DC | PRN

## 2012-06-30 SURGICAL SUPPLY — 61 items
BAG DECANTER FOR FLEXI CONT (MISCELLANEOUS) ×2 IMPLANT
BANDAGE GAUZE ELAST BULKY 4 IN (GAUZE/BANDAGES/DRESSINGS) IMPLANT
BIT DRILL 14MM (INSTRUMENTS) ×1 IMPLANT
BIT DRILL NEURO 2X3.1 SFT TUCH (MISCELLANEOUS) ×1 IMPLANT
BUR BARREL STRAIGHT FLUTE 4.0 (BURR) ×2 IMPLANT
CANISTER SUCTION 2500CC (MISCELLANEOUS) ×2 IMPLANT
CLOTH BEACON ORANGE TIMEOUT ST (SAFETY) ×2 IMPLANT
CONT SPEC 4OZ CLIKSEAL STRL BL (MISCELLANEOUS) ×2 IMPLANT
DECANTER SPIKE VIAL GLASS SM (MISCELLANEOUS) ×2 IMPLANT
DERMABOND ADHESIVE PROPEN (GAUZE/BANDAGES/DRESSINGS) ×1
DERMABOND ADVANCED (GAUZE/BANDAGES/DRESSINGS) ×1
DERMABOND ADVANCED .7 DNX12 (GAUZE/BANDAGES/DRESSINGS) ×1 IMPLANT
DERMABOND ADVANCED .7 DNX6 (GAUZE/BANDAGES/DRESSINGS) ×1 IMPLANT
DRAPE LAPAROTOMY 100X72 PEDS (DRAPES) ×2 IMPLANT
DRAPE MICROSCOPE LEICA (MISCELLANEOUS) IMPLANT
DRAPE POUCH INSTRU U-SHP 10X18 (DRAPES) ×2 IMPLANT
DRESSING TELFA 8X3 (GAUZE/BANDAGES/DRESSINGS) ×2 IMPLANT
DRILL 14MM (INSTRUMENTS) ×2
DRILL NEURO 2X3.1 SOFT TOUCH (MISCELLANEOUS) ×2
DRSG OPSITE 4X5.5 SM (GAUZE/BANDAGES/DRESSINGS) ×2 IMPLANT
DURAPREP 6ML APPLICATOR 50/CS (WOUND CARE) ×2 IMPLANT
ELECT REM PT RETURN 9FT ADLT (ELECTROSURGICAL) ×2
ELECTRODE REM PT RTRN 9FT ADLT (ELECTROSURGICAL) ×1 IMPLANT
GAUZE SPONGE 4X4 16PLY XRAY LF (GAUZE/BANDAGES/DRESSINGS) IMPLANT
GLOVE BIO SURGEON STRL SZ7.5 (GLOVE) ×2 IMPLANT
GLOVE BIOGEL PI IND STRL 8 (GLOVE) ×1 IMPLANT
GLOVE BIOGEL PI IND STRL 8.5 (GLOVE) ×1 IMPLANT
GLOVE BIOGEL PI INDICATOR 8 (GLOVE) ×1
GLOVE BIOGEL PI INDICATOR 8.5 (GLOVE) ×1
GLOVE ECLIPSE 6.5 STRL STRAW (GLOVE) ×2 IMPLANT
GLOVE ECLIPSE 8.5 STRL (GLOVE) ×4 IMPLANT
GLOVE EXAM NITRILE LRG STRL (GLOVE) IMPLANT
GLOVE EXAM NITRILE MD LF STRL (GLOVE) IMPLANT
GLOVE EXAM NITRILE XL STR (GLOVE) IMPLANT
GLOVE EXAM NITRILE XS STR PU (GLOVE) IMPLANT
GOWN BRE IMP SLV AUR LG STRL (GOWN DISPOSABLE) IMPLANT
GOWN BRE IMP SLV AUR XL STRL (GOWN DISPOSABLE) ×2 IMPLANT
GOWN STRL REIN 2XL LVL4 (GOWN DISPOSABLE) ×2 IMPLANT
HEAD HALTER (SOFTGOODS) ×2 IMPLANT
HEMOSTAT POWDER SURGIFOAM 1G (HEMOSTASIS) ×2 IMPLANT
KIT BASIN OR (CUSTOM PROCEDURE TRAY) ×2 IMPLANT
KIT ROOM TURNOVER OR (KITS) ×2 IMPLANT
NEEDLE HYPO 22GX1.5 SAFETY (NEEDLE) ×2 IMPLANT
NEEDLE SPNL 22GX3.5 QUINCKE BK (NEEDLE) ×2 IMPLANT
NS IRRIG 1000ML POUR BTL (IV SOLUTION) ×2 IMPLANT
PACK LAMINECTOMY NEURO (CUSTOM PROCEDURE TRAY) ×2 IMPLANT
PAD ARMBOARD 7.5X6 YLW CONV (MISCELLANEOUS) ×6 IMPLANT
PLATE 64MM (Plate) ×2 IMPLANT
PUTTY DBM 5CC ×2 IMPLANT
RUBBERBAND STERILE (MISCELLANEOUS) IMPLANT
SCREW 14MM (Screw) ×20 IMPLANT
SPACER PAR CORT CERV S (Spacer) ×2 IMPLANT
SPACER PAR CORT CERV S 5M (Spacer) ×2 IMPLANT
SPACER PAR CORT CERV S 6M (Spacer) ×4 IMPLANT
SPONGE INTESTINAL PEANUT (DISPOSABLE) ×2 IMPLANT
SPONGE SURGIFOAM ABS GEL 100 (HEMOSTASIS) ×2 IMPLANT
SUT VIC AB 3-0 SH 8-18 (SUTURE) ×2 IMPLANT
SYR 20ML ECCENTRIC (SYRINGE) ×2 IMPLANT
TOWEL OR 17X24 6PK STRL BLUE (TOWEL DISPOSABLE) ×2 IMPLANT
TOWEL OR 17X26 10 PK STRL BLUE (TOWEL DISPOSABLE) ×2 IMPLANT
WATER STERILE IRR 1000ML POUR (IV SOLUTION) ×2 IMPLANT

## 2012-06-30 NOTE — Progress Notes (Signed)
PHARMACIST - PHYSICIAN ORDER COMMUNICATION  CONCERNING: P&T Medication Policy on Herbal Medications  DESCRIPTION:  This patient's order for:  COQ-10  has been noted.  This product(s) is classified as an "herbal" or natural product. Due to a lack of definitive safety studies or FDA approval, nonstandard manufacturing practices, plus the potential risk of unknown drug-drug interactions while on inpatient medications, the Pharmacy and Therapeutics Committee does not permit the use of "herbal" or natural products of this type within [place].   ACTION TAKEN: The pharmacy department is unable to verify this order at this time and your patient has been informed of this safety policy. Please reevaluate patient's clinical condition at discharge and address if the herbal or natural product(s) should be resumed at that time.  

## 2012-06-30 NOTE — Anesthesia Procedure Notes (Addendum)
Procedure Name: Intubation Date/Time: 06/30/2012 8:09 AM Performed by: Wray Kearns A Pre-anesthesia Checklist: Patient identified, Timeout performed, Emergency Drugs available, Suction available and Patient being monitored Patient Re-evaluated:Patient Re-evaluated prior to inductionOxygen Delivery Method: Circle system utilized Preoxygenation: Pre-oxygenation with 100% oxygen Intubation Type: IV induction and Cricoid Pressure applied Ventilation: Mask ventilation without difficulty Laryngoscope Size: Mac and 3 Grade View: Grade I Tube type: Oral Tube size: 7.5 mm Number of attempts: 1 Airway Equipment and Method: Stylet and LTA kit utilized Placement Confirmation: breath sounds checked- equal and bilateral,  ETT inserted through vocal cords under direct vision and positive ETCO2 Secured at: 22 cm Tube secured with: Tape Dental Injury: Teeth and Oropharynx as per pre-operative assessment  Comments: PT Occiput / Neck kept in neutral position throughout induction.

## 2012-06-30 NOTE — Anesthesia Postprocedure Evaluation (Signed)
  Anesthesia Post-op Note  Patient: Daniel Wagner  Procedure(s) Performed: Procedure(s) with comments: ANTERIOR CERVICAL DECOMPRESSION/DISCECTOMY FUSION 4 LEVELS (N/A) - C3-4 C4-5 C5-6 C6-7 Anterior cervical decompression/diskectomy/fusion  Patient Location: PACU  Anesthesia Type:General  Level of Consciousness: awake  Airway and Oxygen Therapy: Patient Spontanous Breathing  Post-op Pain: mild  Post-op Assessment: Post-op Vital signs reviewed  Post-op Vital Signs: stable  Complications: No apparent anesthesia complications

## 2012-06-30 NOTE — H&P (Signed)
Daniel Wagner is an 72 y.o. male.   Chief Complaint: Neck shoulder and right arm pain HPI: The patient is a 72 y.o. Male with Hx of previous disc herniation at C4-5 on the right treated with a Met-rx discectomy on the right. He tolerated this well and has had good relief of his pre op pain. His surgery was a year ago.  Nonetheless he has had persistence of neck and shoulder pain that has become unrelenting. Mri demonstrates that he has multiple levels of severe spondylosis that will need decompression. These include C3-4, C4-5, C5-6, and C6-7. He is now admitted for surgery.  Past Medical History  Diagnosis Date  . Hypertension   . GERD (gastroesophageal reflux disease) Hx in past.  . Arthritis   . Cancer     Skin cancer- Basal cell    Past Surgical History  Procedure Laterality Date  . Back surgery  08/2010    lumbar fusion  . Neck surgery      x2  . Cholecystectomy  1990's  . Transurethral resection of prostate  1999  . Sinus surgeries      x2  . Skin cancer excision      arm chest and head basal cell    No family history on file. Social History:  reports that he has never smoked. He does not have any smokeless tobacco history on file. He reports that he drinks about 4.2 ounces of alcohol per week. He reports that he does not use illicit drugs.  Allergies:  Allergies  Allergen Reactions  . Aspirin Other (See Comments)    Jittery if taking large quantities    No prescriptions prior to admission    No results found for this or any previous visit (from the past 48 hour(s)). No results found.  Review of Systems  HENT: Positive for neck pain.   Eyes: Negative.   Respiratory: Negative.   Cardiovascular: Negative.   Gastrointestinal: Negative.   Genitourinary: Negative.   Skin: Negative.   Neurological: Positive for tingling and weakness.  Endo/Heme/Allergies: Negative.   Psychiatric/Behavioral: Negative.     There were no vitals taken for this visit. Physical Exam   Constitutional: He is oriented to person, place, and time. He appears well-developed and well-nourished.  HENT:  Head: Normocephalic and atraumatic.  Eyes: Conjunctivae and EOM are normal. Pupils are equal, round, and reactive to light.  Neck: Normal range of motion. Neck supple.  ROM 60 degrees left and right. F/E about 50 %normal  Cardiovascular: Normal rate and regular rhythm.   Respiratory: Effort normal and breath sounds normal.  GI: Soft. Bowel sounds are normal.  Neurological: He is alert and oriented to person, place, and time. He has normal reflexes.  Mild weakness in deltoids bilaterally.  Skin: Skin is warm and dry.  Psychiatric: He has a normal mood and affect. His behavior is normal. Judgment and thought content normal.     Assessment/Plan Daniel Wagner had an MRI of his cervical spine that was completed on the 9th of February. This study demonstrates that Daniel Wagner has advanced spondylitic changes at multiple levels.  The singular worst level is at C4-5 where last year we removed a large herniated fragment of disc.  There he has had progression of the degenerative change with significant central and lateral herniation of the residual disc that is there, biforaminal stenosis is noted at C4-5. There is evidence of cord compression at this level also.  The second worst level is at C3-4 where  he has an anterolisthesis of those two vertebrae and he has mild biforaminal stenosis that I do not believe is clinically significant, but the degree of arthritic change at this point is quite advanced. Beyond this, C5-6 demonstrates that the patient has effacement of the spinal cord and greater right-sided than left-sided foraminal stenosis, and this area should be decompressed.  C6-7 is similar to C3-4 in that it has advanced spondylitic changes with mild biforaminal stenosis that I do not believe are a part of the acute process, but will likely become more symptomatic if left alone next to the adjacent  arthrodesis that he will have for the other levels involved.   I demonstrated the findings to Daniel Wagner.    I note on his exam that his range of motion has been quite good, turning a good 60 degrees to the left and to the right with his shoulders fixed.  Likely he may have to sacrifice some of the range of motion; however, given the current nature and state of affairs or cord compression and foraminal stenosis, I believe that his best option is for a four-level anterior decompression and arthrodesis from C3 to C7.  We will plan on scheduling his surgery at the earliest convenience.   I discussed with him the approach via the front of the neck, moving the windpipe and esophagus to one side, the jugular vein and carotid artery to the other side. I discussed the fact that he would have four levels of his neck fused. These levels are already quite arthritic in nature and I would be hopeful that he would have good preservation of reasonable function in his rotation and flexion and extension of his neck.  Of course the risks are to the nerve roots and the spinal cord themselves, but I believe that any of these things should be mitigated by what is already occurring, that is that he is incurring some nerve damage to his nerve roots from spinal cord compromise. He is admitted for surgery today.    Alric Geise J 06/30/2012, 4:27 AM

## 2012-06-30 NOTE — Anesthesia Preprocedure Evaluation (Addendum)
Anesthesia Evaluation  Patient identified by MRN, date of birth, ID band Patient awake    Reviewed: Allergy & Precautions, H&P , NPO status   Airway Mallampati: I  Neck ROM: Full    Dental  (+) Teeth Intact   Pulmonary neg pulmonary ROS,  breath sounds clear to auscultation        Cardiovascular hypertension, Rhythm:Regular Rate:Normal     Neuro/Psych    GI/Hepatic GERD-  ,  Endo/Other  negative endocrine ROS  Renal/GU negative Renal ROS     Musculoskeletal  (+) Arthritis -,   Abdominal   Peds  Hematology negative hematology ROS (+)   Anesthesia Other Findings   Reproductive/Obstetrics                          Anesthesia Physical Anesthesia Plan  ASA: II  Anesthesia Plan: General   Post-op Pain Management:    Induction: Intravenous  Airway Management Planned: Oral ETT  Additional Equipment:   Intra-op Plan:   Post-operative Plan: Extubation in OR  Informed Consent: I have reviewed the patients History and Physical, chart, labs and discussed the procedure including the risks, benefits and alternatives for the proposed anesthesia with the patient or authorized representative who has indicated his/her understanding and acceptance.     Plan Discussed with:   Anesthesia Plan Comments:        Anesthesia Quick Evaluation

## 2012-06-30 NOTE — Transfer of Care (Signed)
Immediate Anesthesia Transfer of Care Note  Patient: Daniel Wagner  Procedure(s) Performed: Procedure(s) with comments: ANTERIOR CERVICAL DECOMPRESSION/DISCECTOMY FUSION 4 LEVELS (N/A) - C3-4 C4-5 C5-6 C6-7 Anterior cervical decompression/diskectomy/fusion  Patient Location: PACU  Anesthesia Type:General  Level of Consciousness: oriented, sedated, patient cooperative and responds to stimulation  Airway & Oxygen Therapy: Patient Spontanous Breathing and Patient connected to face mask oxygen  Post-op Assessment: Report given to PACU RN, Post -op Vital signs reviewed and stable, Patient moving all extremities and Patient moving all extremities X 4  Post vital signs: Reviewed and stable  Complications: No apparent anesthesia complications

## 2012-06-30 NOTE — Progress Notes (Signed)
Utilization review completed. Tirso Laws, RN, BSN. 

## 2012-06-30 NOTE — Op Note (Signed)
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 Asst.: Barbaraann Barthel Indications: Patient is a 72 year old individual is had significant problems with neck shoulder and arm pain. A year ago he had an acutely ruptured disc at C4-C5 on the right side causing a severe C5 radiculopathy. This was resected via a posterior approach. The patient improved significantly however he skipped chronic neck pain more recently has redeveloped some increasing shoulder pain with now arm weakness in the C6 distribution. The patient also notes that he's been unsteady in gait. 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 Because of evidence of spinal cord compression and nerve root compromise he was advised regarding surgical decompression and arthrodesis from C3-C7.  Procedure: The patient was brought to the operating room placed on the table in supine position. After the smooth induction of general endotracheal anesthesia neck was placed in 5 pounds of halter traction and prepped with alcohol and DuraPrep. After sterile draping and appropriate timeout procedure a transverse incision was created in the left side of the neck and carried down to the platysma. The plane between the sternocleidomastoid and strap muscles dissected bluntly until the prevertebral space was reached. The first identifiable disc space was noted to be a 3 C4 on a localizing radiograph. The dissection was then undertaken in the longus coli  muscle to allow placement of a self-retaining Caspar type retractor.  The anterior longitudinal ligament was opened at C3-C4 and ventral osteophytes were removed with a Leksell rongeur and Kerrison punch. Interspace was cleared of significant quantity of the degenerated disc material in the region of the posterior longitudinal ligament was reached. Dissection was carried out using a high-speed drill and 3-0 Karlin curettes. Uncinate processes were drilled down and removed and osteophytes from the inferior margin of the body of C3 were removed with a Kerrison 2 mm gold punch. After the central canal and lateral recesses were well decompressed the stasis was achieved with the bipolar cautery and some small pledgets of Gelfoam soaked in thrombin that were later irrigated away.  A 5 mm cortical ring allograft was then prepared by enlarging the central cavity and filling with demineralized bone matrix and placing into the interspace. C4-5 Was decompressed and fused in a similar fashion. A 6 mm cortical ring allograft was used here. C5-C6 was then decompressed and also fused in a similar fashion also using a 6 mm cortical ring allograft. At C6 C7-1 decompression was similarly accomplished and a 5 mm cortical ring allograft was used. Large ventral osteophytes were removed with a Kerrison and Leksell rongeur in addition to being smoothed with a high-speed bur.  Next the retractor was removed and a 64 mm trestle plate was placed over the vertebral bodies and secured with 14 mm variable angle screws. A final localizing radiograph identified the position of the surgical construct. The stasis was achieved in the soft tissues and then the platysma was closed with 3-0 Vicryl in an interrupted fashion and 3-0 Vicryl was used in the subcuticular tissue. Blood loss was estimated at 300 cc.

## 2012-07-01 MED ORDER — DIAZEPAM 5 MG PO TABS: 5.0000 mg | ORAL_TABLET | Freq: Four times a day (QID) | ORAL | Status: DC | PRN

## 2012-07-01 MED ORDER — HYDROCODONE-ACETAMINOPHEN 5-325 MG PO TABS: 1.0000 | ORAL_TABLET | Freq: Two times a day (BID) | ORAL | Status: DC | PRN

## 2012-07-01 NOTE — Progress Notes (Signed)
Pt ambulated in the room with RN assist.  Pt had steady gait and tolerated ambulation well.  Pt back to bed without incidence.  Will continue to monitor.

## 2012-07-01 NOTE — Care Management Note (Signed)
CARE MANAGEMENT NOTE 07/01/2012  Patient:  Daniel Wagner,Daniel Wagner   Account Number:  [id number]  Date Initiated:  07/01/2012  Documentation initiated by:  Vance Peper  Subjective/Objective Assessment:   72 yr old male s/p C3-4,C4-5,C6-7 anterior cervical discectomy.     Action/Plan:   No home health or DME needs identified.   Anticipated DC Date:  07/01/2012   Anticipated DC Plan:  HOME/SELF CARE      DC Planning Services  CM consult      Choice offered to / List presented to:             Status of service:  Completed, signed off Medicare Important Message given?   (If response is "NO", the following Medicare IM given date fields will be blank) Date Medicare IM given:   Date Additional Medicare IM given:    Discharge Disposition:  HOME/SELF CARE

## 2012-07-01 NOTE — Progress Notes (Signed)
Patient discharge paperwork completed.  Prescription for valium and vicodin given to patient.  Education completed and IV removed.  Patient's wife driving him home.  Lance Bosch, RN

## 2012-07-01 NOTE — Discharge Summary (Signed)
Physician Discharge Summary  Patient ID: Daniel Wagner MRN: [id number] DOB/AGE: 11-21-1940 72 y.o.  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 *   Discharged Condition: good  Hospital Course: Patient was admitted to undergo 4 level anterior decompression and arthrodesis with structural allograft. He tolerated the procedure well. His swallowing well. His incision is clean and dry. He is ready for discharge. His motor function is intact.  Consults: None  Significant Diagnostic Studies: MRI cervical spine  Treatments: surgery: Anterior cervical decompression and arthrodesis with structural allograft C3-4 C4-5 C5-6 and C6-C7 anterior fixation C3-C7.  Discharge Exam: Blood pressure 118/54, pulse 72, temperature 97.7 F (36.5 C), temperature source Oral, resp. rate 18, SpO2 100.00%. Motor function is intact and deltoids biceps triceps grips and intrinsics. Sensation is normal distal upper extremities. Station and gait is intact. Incision the neck is clean and dry with minimal swelling.  Disposition: 01-Home or Self Care  Discharge Orders   Future Orders Complete By Expires     Call MD for:  redness, tenderness, or signs of infection (pain, swelling, redness, odor or green/yellow discharge around incision site)  As directed     Call MD for:  severe uncontrolled pain  As directed     Call MD for:  temperature >100.4  As directed     Diet - low sodium heart healthy  As directed     Discharge instructions  As directed     Comments:      Okay to shower. Do not apply salves or appointments to incision. No heavy lifting with the upper extremities greater  than 15 pounds. May resume driving when not requiring pain medication and patient feels comfortable with doing so.    Increase activity slowly  As directed         Medication List    TAKE these medications       ALPRAZolam 0.25 MG tablet  Commonly known as:  XANAX  Take 0.25 mg by mouth daily as needed for anxiety.     ANDROGEL 40.5 MG/2.5GM (1.62%) Gel  Generic drug:  Testosterone  Place 1 Squirt onto the skin every evening.     b complex vitamins tablet  Take 1 tablet by mouth daily.     B-12 PO  Take 1 tablet by mouth daily.     B-6 PO  Take 1 tablet by mouth daily.     CALCIUM + D + K PO  Take 1 tablet by mouth daily.     CoQ10 200 MG Caps  Take 200 mg by mouth 2 (two) times daily.     CRANBERRY PO  Take 4,200 mg by mouth daily.     diazepam 5 MG tablet  Commonly known as:  VALIUM  Take 1 tablet (5 mg total) by mouth every 6 (six) hours as needed (Muscle spasm).     doxazosin 4 MG tablet  Commonly known as:  CARDURA  Take 4 mg by mouth daily.     fish oil-omega-3 fatty acids 1000 MG capsule  Take 1 g by mouth daily.     fluticasone 50 MCG/ACT nasal spray  Commonly known as:  FLONASE  Place 2 sprays into the nose daily as needed (for stuffiness).  GLUCOSAMINE-CHONDROITIN PO  Take 1 tablet by mouth daily.     HYDROcodone-acetaminophen 5-325 MG per tablet  Commonly known as:  NORCO/VICODIN  Take 1-2 tablets by mouth 2 (two) times daily as needed for pain.     HYDROcodone-acetaminophen 5-325 MG per tablet  Commonly known as:  NORCO/VICODIN  Take 1 tablet by mouth 2 (two) times daily as needed for pain.     lisinopril 20 MG tablet  Commonly known as:  PRINIVIL,ZESTRIL  Take 20 mg by mouth every evening.     lovastatin 20 MG tablet  Commonly known as:  MEVACOR  Take 20 mg by mouth at bedtime.     MAGNESIUM PO  Take 1 tablet by mouth daily.     meloxicam 7.5 MG tablet  Commonly known as:  MOBIC  Take 7.5 mg by mouth 2 (two) times daily.      multivitamins ther. w/minerals Tabs  Take 1 tablet by mouth daily.     NIACIN PO  Take 1 tablet by mouth daily.     VITAMIN A PO  Take 1 tablet by mouth daily.     vitamin C 500 MG tablet  Commonly known as:  ASCORBIC ACID  Take 500 mg by mouth daily.     VITAMIN E PO  Take 1 capsule by mouth daily.         SignedStefani Dama 07/01/2012, 9:35 AM

## 2012-07-01 NOTE — Progress Notes (Signed)
Pt Foley D/C at 0515, pt DTV by 1115.

## 2012-07-03 ENCOUNTER — Encounter (HOSPITAL_COMMUNITY): Payer: Self-pay | Admitting: Neurological Surgery

## 2012-11-09 DIAGNOSIS — M5000 Cervical disc disorder with myelopathy, unspecified cervical region: Secondary | ICD-10-CM | POA: Insufficient documentation

## 2013-01-28 DIAGNOSIS — N200 Calculus of kidney: Secondary | ICD-10-CM | POA: Insufficient documentation

## 2013-03-12 DIAGNOSIS — Z87442 Personal history of urinary calculi: Secondary | ICD-10-CM

## 2013-03-12 HISTORY — DX: Personal history of urinary calculi: Z87.442

## 2013-04-19 DIAGNOSIS — M545 Low back pain, unspecified: Secondary | ICD-10-CM | POA: Insufficient documentation

## 2014-02-25 ENCOUNTER — Other Ambulatory Visit: Payer: Self-pay | Admitting: Neurological Surgery

## 2014-02-25 DIAGNOSIS — M545 Low back pain, unspecified: Secondary | ICD-10-CM

## 2014-03-04 ENCOUNTER — Other Ambulatory Visit: Payer: Medicare Other

## 2014-03-05 ENCOUNTER — Ambulatory Visit
Admission: RE | Admit: 2014-03-05 | Discharge: 2014-03-05 | Disposition: A | Discharge status: Home / Self Care | Payer: Medicare Other | Source: Ambulatory Visit | Attending: Neurological Surgery | Admitting: Neurological Surgery

## 2014-03-05 DIAGNOSIS — M545 Low back pain, unspecified: Secondary | ICD-10-CM

## 2014-03-05 MED ORDER — GADOBENATE DIMEGLUMINE 529 MG/ML IV SOLN
18.0000 mL | Freq: Once | INTRAVENOUS | Status: AC | PRN
  Administered 2014-03-05: 18 mL via INTRAVENOUS

## 2014-03-06 DIAGNOSIS — M48061 Spinal stenosis, lumbar region without neurogenic claudication: Secondary | ICD-10-CM | POA: Insufficient documentation

## 2014-03-11 ENCOUNTER — Other Ambulatory Visit: Payer: Self-pay | Admitting: Neurological Surgery

## 2014-03-13 ENCOUNTER — Other Ambulatory Visit: Payer: Medicare Other

## 2014-03-18 ENCOUNTER — Other Ambulatory Visit: Payer: Self-pay

## 2014-03-18 ENCOUNTER — Encounter (HOSPITAL_COMMUNITY): Payer: Self-pay

## 2014-03-18 ENCOUNTER — Encounter (HOSPITAL_COMMUNITY)
Admission: RE | Admit: 2014-03-18 | Discharge: 2014-03-18 | Disposition: A | Discharge status: Home / Self Care | Payer: Medicare Other | Source: Ambulatory Visit | Attending: Neurological Surgery | Admitting: Neurological Surgery

## 2014-03-18 HISTORY — DX: Personal history of urinary calculi: Z87.442

## 2014-03-18 LAB — BASIC METABOLIC PANEL
Anion gap: 13 (ref 5–15)
BUN: 17 mg/dL (ref 6–23)
CO2: 24 mEq/L (ref 19–32)
CREATININE: 1.01 mg/dL (ref 0.50–1.35)
Calcium: 9.3 mg/dL (ref 8.4–10.5)
Chloride: 101 mEq/L (ref 96–112)
GFR, EST AFRICAN AMERICAN: 84 mL/min — AB (ref >90)
GFR, EST NON AFRICAN AMERICAN: 73 mL/min — AB (ref >90)
GLUCOSE: 111 mg/dL — AB (ref 70–99)
POTASSIUM: 4.2 meq/L (ref 3.7–5.3)
Sodium: 138 mEq/L (ref 137–147)

## 2014-03-18 LAB — CBC
HCT: 40.7 % (ref 39.0–52.0)
Hemoglobin: 13.9 g/dL (ref 13.0–17.0)
MCH: 31.9 pg (ref 26.0–34.0)
MCHC: 34.2 g/dL (ref 30.0–36.0)
MCV: 93.3 fL (ref 78.0–100.0)
Platelets: 191 10*3/uL (ref 150–400)
RBC: 4.36 MIL/uL (ref 4.22–5.81)
RDW: 13.6 % (ref 11.5–15.5)
WBC: 7.8 10*3/uL (ref 4.0–10.5)

## 2014-03-18 LAB — SURGICAL PCR SCREEN
MRSA, PCR: NEGATIVE
Staphylococcus aureus: NEGATIVE

## 2014-03-18 LAB — TYPE AND SCREEN
ABO/RH(D): A POS
Antibody Screen: NEGATIVE

## 2014-03-18 NOTE — Pre-Procedure Instructions (Signed)
Daniel Wagner  03/18/2014   Your procedure is scheduled on:  Tuesday, December 8.  Report to [facility] Admitting at 11:15 AM.  Call this number if you have problems the morning of surgery: [phone]   Remember:   Do not eat food or drink liquids after midnight.   Take these medicines the morning of surgery with A SIP OF WATER: doxazosin (CARDURA), guaiFENesin (MUCINEX). May use Flonase.             Take if needed:HYDROcodone-acetaminophen (NORCO/VICODIN).               Stop taking meloxicam (MOBIC).   Do not wear jewelry, make-up or nail polish.  Do not wear lotions, powders, or perfumes.   Men may shave face and neck.  Do not bring valuables to the hospital.             [facility] is not responsible  for any belongings or valuables.               Contacts, dentures or bridgework may not be worn into surgery.  Leave suitcase in the car. After surgery it may be brought to your room.  For patients admitted to the hospital, discharge time is determined by your treatment team.                 Special Instructions: Review  [facility] - Preparing For Surgery.   Please read over the following fact sheets that you were given: Pain Booklet, Coughing and Deep Breathing, Blood Transfusion Information and Surgical Site Infection Prevention

## 2014-03-19 ENCOUNTER — Inpatient Hospital Stay (HOSPITAL_COMMUNITY): Payer: Medicare Other

## 2014-03-19 ENCOUNTER — Inpatient Hospital Stay (HOSPITAL_COMMUNITY): Payer: Medicare Other | Admitting: Anesthesiology

## 2014-03-19 ENCOUNTER — Encounter (HOSPITAL_COMMUNITY)
Admission: RE | Disposition: A | Discharge status: Home / Self Care | Payer: Medicare Other | Source: Ambulatory Visit | Attending: Neurological Surgery

## 2014-03-19 ENCOUNTER — Inpatient Hospital Stay (HOSPITAL_COMMUNITY)
Admission: RE | Admit: 2014-03-19 | Discharge: 2014-03-21 | DRG: 459 | Disposition: A | Discharge status: Home / Self Care | Payer: Medicare Other | Source: Ambulatory Visit | Attending: Neurological Surgery | Admitting: Neurological Surgery

## 2014-03-19 ENCOUNTER — Encounter (HOSPITAL_COMMUNITY): Payer: Self-pay | Admitting: Anesthesiology

## 2014-03-19 DIAGNOSIS — Z419 Encounter for procedure for purposes other than remedying health state, unspecified: Secondary | ICD-10-CM

## 2014-03-19 DIAGNOSIS — G9519 Other vascular myelopathies: Secondary | ICD-10-CM | POA: Diagnosis present

## 2014-03-19 DIAGNOSIS — M4806 Spinal stenosis, lumbar region: Principal | ICD-10-CM | POA: Diagnosis present

## 2014-03-19 DIAGNOSIS — Z85828 Personal history of other malignant neoplasm of skin: Secondary | ICD-10-CM | POA: Diagnosis not present

## 2014-03-19 DIAGNOSIS — Z981 Arthrodesis status: Secondary | ICD-10-CM | POA: Diagnosis not present

## 2014-03-19 DIAGNOSIS — I10 Essential (primary) hypertension: Secondary | ICD-10-CM | POA: Diagnosis present

## 2014-03-19 DIAGNOSIS — M47896 Other spondylosis, lumbar region: Secondary | ICD-10-CM | POA: Diagnosis present

## 2014-03-19 DIAGNOSIS — M48062 Spinal stenosis, lumbar region with neurogenic claudication: Secondary | ICD-10-CM | POA: Diagnosis present

## 2014-03-19 DIAGNOSIS — K219 Gastro-esophageal reflux disease without esophagitis: Secondary | ICD-10-CM | POA: Diagnosis present

## 2014-03-19 DIAGNOSIS — M4800 Spinal stenosis, site unspecified: Secondary | ICD-10-CM

## 2014-03-19 SURGERY — POSTERIOR LUMBAR FUSION 1 LEVEL
Anesthesia: General | Site: Back

## 2014-03-19 MED ORDER — ONDANSETRON HCL 4 MG/2ML IJ SOLN
INTRAMUSCULAR | Status: DC | PRN
  Administered 2014-03-19: 4 mg via INTRAVENOUS

## 2014-03-19 MED ORDER — ARTIFICIAL TEARS OP OINT
TOPICAL_OINTMENT | OPHTHALMIC | Status: DC | PRN
  Administered 2014-03-19: 1 via OPHTHALMIC

## 2014-03-19 MED ORDER — MENTHOL 3 MG MT LOZG
1.0000 | LOZENGE | OROMUCOSAL | Status: DC | PRN
  Administered 2014-03-20: 3 mg via ORAL
  Filled 2014-03-19: qty 9

## 2014-03-19 MED ORDER — THROMBIN 20000 UNITS EX SOLR
CUTANEOUS | Status: DC | PRN
  Administered 2014-03-19: 16:00:00 via TOPICAL

## 2014-03-19 MED ORDER — FENTANYL CITRATE 0.05 MG/ML IJ SOLN
INTRAMUSCULAR | Status: AC
  Filled 2014-03-19: qty 5

## 2014-03-19 MED ORDER — SUCCINYLCHOLINE CHLORIDE 20 MG/ML IJ SOLN
INTRAMUSCULAR | Status: AC
  Filled 2014-03-19: qty 1

## 2014-03-19 MED ORDER — METHOCARBAMOL 1000 MG/10ML IJ SOLN
500.0000 mg | Freq: Four times a day (QID) | INTRAVENOUS | Status: DC | PRN
  Filled 2014-03-19: qty 5

## 2014-03-19 MED ORDER — SODIUM CHLORIDE 0.9 % IV SOLN
INTRAVENOUS | Status: DC | PRN
  Administered 2014-03-19: 19:00:00 via INTRAVENOUS

## 2014-03-19 MED ORDER — SODIUM CHLORIDE 0.9 % IV SOLN: 250.0000 mL | INTRAVENOUS | Status: DC

## 2014-03-19 MED ORDER — SUCCINYLCHOLINE CHLORIDE 20 MG/ML IJ SOLN
INTRAMUSCULAR | Status: DC | PRN
  Administered 2014-03-19: 100 mg via INTRAVENOUS

## 2014-03-19 MED ORDER — ONDANSETRON HCL 4 MG/2ML IJ SOLN
4.0000 mg | INTRAMUSCULAR | Status: DC | PRN
  Administered 2014-03-19: 4 mg via INTRAVENOUS
  Filled 2014-03-19: qty 2

## 2014-03-19 MED ORDER — MIDAZOLAM HCL 2 MG/2ML IJ SOLN
INTRAMUSCULAR | Status: AC
  Filled 2014-03-19: qty 2

## 2014-03-19 MED ORDER — OXYCODONE-ACETAMINOPHEN 5-325 MG PO TABS
1.0000 | ORAL_TABLET | ORAL | Status: DC | PRN
  Administered 2014-03-19 – 2014-03-20 (×2): 2 via ORAL
  Filled 2014-03-19 (×2): qty 2

## 2014-03-19 MED ORDER — ACETAMINOPHEN 650 MG RE SUPP: 650.0000 mg | RECTAL | Status: DC | PRN

## 2014-03-19 MED ORDER — LACTATED RINGERS IV SOLN
INTRAVENOUS | Status: DC | PRN
  Administered 2014-03-19 (×3): via INTRAVENOUS

## 2014-03-19 MED ORDER — SODIUM CHLORIDE 0.9 % IR SOLN
Status: DC | PRN
  Administered 2014-03-19: 16:00:00

## 2014-03-19 MED ORDER — NEOSTIGMINE METHYLSULFATE 10 MG/10ML IV SOLN
INTRAVENOUS | Status: DC | PRN
  Administered 2014-03-19: 4 mg via INTRAVENOUS

## 2014-03-19 MED ORDER — GLYCOPYRROLATE 0.2 MG/ML IJ SOLN
INTRAMUSCULAR | Status: DC | PRN
  Administered 2014-03-19: 0.6 mg via INTRAVENOUS

## 2014-03-19 MED ORDER — PHENYLEPHRINE HCL 10 MG/ML IJ SOLN
INTRAMUSCULAR | Status: DC | PRN
  Administered 2014-03-19: 80 ug via INTRAVENOUS
  Administered 2014-03-19: 40 ug via INTRAVENOUS

## 2014-03-19 MED ORDER — PHENOL 1.4 % MT LIQD: 1.0000 | OROMUCOSAL | Status: DC | PRN

## 2014-03-19 MED ORDER — POLYVINYL ALCOHOL 1.4 % OP SOLN
1.0000 [drp] | Freq: Two times a day (BID) | OPHTHALMIC | Status: DC
  Administered 2014-03-19 – 2014-03-21 (×4): 1 [drp] via OPHTHALMIC
  Filled 2014-03-19: qty 15

## 2014-03-19 MED ORDER — HYDROCODONE-ACETAMINOPHEN 5-325 MG PO TABS
1.0000 | ORAL_TABLET | ORAL | Status: DC | PRN
Start: 2014-03-19 — End: 2014-03-21
  Administered 2014-03-20 – 2014-03-21 (×5): 1 via ORAL
  Filled 2014-03-19 (×5): qty 1

## 2014-03-19 MED ORDER — ROCURONIUM BROMIDE 50 MG/5ML IV SOLN
INTRAVENOUS | Status: AC
  Filled 2014-03-19: qty 1

## 2014-03-19 MED ORDER — LISINOPRIL 20 MG PO TABS
20.0000 mg | ORAL_TABLET | Freq: Every evening | ORAL | Status: DC
  Administered 2014-03-19 – 2014-03-21 (×3): 20 mg via ORAL
  Filled 2014-03-19 (×3): qty 1

## 2014-03-19 MED ORDER — FLUTICASONE PROPIONATE 50 MCG/ACT NA SUSP
2.0000 | Freq: Every day | NASAL | Status: DC | PRN
  Filled 2014-03-19: qty 16

## 2014-03-19 MED ORDER — METHOCARBAMOL 500 MG PO TABS
500.0000 mg | ORAL_TABLET | Freq: Four times a day (QID) | ORAL | Status: DC | PRN
  Administered 2014-03-19: 500 mg via ORAL

## 2014-03-19 MED ORDER — LABETALOL HCL 5 MG/ML IV SOLN
INTRAVENOUS | Status: AC
Start: 2014-03-19 — End: 2014-03-19
  Filled 2014-03-19: qty 4

## 2014-03-19 MED ORDER — 0.9 % SODIUM CHLORIDE (POUR BTL) OPTIME
TOPICAL | Status: DC | PRN
Start: 2014-03-19 — End: 2014-03-19
  Administered 2014-03-19: 1000 mL

## 2014-03-19 MED ORDER — FENTANYL CITRATE 0.05 MG/ML IJ SOLN
INTRAMUSCULAR | Status: DC | PRN
  Administered 2014-03-19 (×3): 50 ug via INTRAVENOUS
  Administered 2014-03-19 (×3): 25 ug via INTRAVENOUS
  Administered 2014-03-19: 100 ug via INTRAVENOUS
  Administered 2014-03-19: 25 ug via INTRAVENOUS

## 2014-03-19 MED ORDER — PROPOFOL 10 MG/ML IV BOLUS
INTRAVENOUS | Status: AC
  Filled 2014-03-19: qty 20

## 2014-03-19 MED ORDER — HYDROMORPHONE HCL 1 MG/ML IJ SOLN
INTRAMUSCULAR | Status: AC
  Filled 2014-03-19: qty 1

## 2014-03-19 MED ORDER — NEOSTIGMINE METHYLSULFATE 10 MG/10ML IV SOLN
INTRAVENOUS | Status: AC
  Filled 2014-03-19: qty 1

## 2014-03-19 MED ORDER — ROCURONIUM BROMIDE 100 MG/10ML IV SOLN
INTRAVENOUS | Status: DC | PRN
  Administered 2014-03-19: 10 mg via INTRAVENOUS
  Administered 2014-03-19: 20 mg via INTRAVENOUS
  Administered 2014-03-19: 40 mg via INTRAVENOUS
  Administered 2014-03-19: 10 mg via INTRAVENOUS

## 2014-03-19 MED ORDER — GLYCOPYRROLATE 0.2 MG/ML IJ SOLN
INTRAMUSCULAR | Status: AC
  Filled 2014-03-19: qty 3

## 2014-03-19 MED ORDER — LIDOCAINE HCL (CARDIAC) 20 MG/ML IV SOLN
INTRAVENOUS | Status: DC | PRN
  Administered 2014-03-19: 80 mg via INTRAVENOUS

## 2014-03-19 MED ORDER — DOXAZOSIN MESYLATE 8 MG PO TABS
4.0000 mg | ORAL_TABLET | Freq: Every day | ORAL | Status: DC
  Administered 2014-03-20 – 2014-03-21 (×2): 4 mg via ORAL
  Filled 2014-03-19 (×2): qty 0.5
  Filled 2014-03-19: qty 1

## 2014-03-19 MED ORDER — PRAVASTATIN SODIUM 40 MG PO TABS
40.0000 mg | ORAL_TABLET | Freq: Every day | ORAL | Status: DC
  Administered 2014-03-20 – 2014-03-21 (×2): 40 mg via ORAL
  Filled 2014-03-19 (×2): qty 1

## 2014-03-19 MED ORDER — PROPOFOL 10 MG/ML IV BOLUS
INTRAVENOUS | Status: DC | PRN
  Administered 2014-03-19: 150 mg via INTRAVENOUS

## 2014-03-19 MED ORDER — CEFAZOLIN SODIUM-DEXTROSE 2-3 GM-% IV SOLR
INTRAVENOUS | Status: DC | PRN
  Administered 2014-03-19: 2 g via INTRAVENOUS

## 2014-03-19 MED ORDER — LIDOCAINE-EPINEPHRINE 1 %-1:100000 IJ SOLN
INTRAMUSCULAR | Status: DC | PRN
  Administered 2014-03-19: 10 mL

## 2014-03-19 MED ORDER — BUPIVACAINE HCL (PF) 0.5 % IJ SOLN
INTRAMUSCULAR | Status: DC | PRN
  Administered 2014-03-19 (×2): 10 mL

## 2014-03-19 MED ORDER — ALUM & MAG HYDROXIDE-SIMETH 200-200-20 MG/5ML PO SUSP: 30.0000 mL | Freq: Four times a day (QID) | ORAL | Status: DC | PRN

## 2014-03-19 MED ORDER — ACETAMINOPHEN 325 MG PO TABS: 650.0000 mg | ORAL_TABLET | ORAL | Status: DC | PRN

## 2014-03-19 MED ORDER — DIAZEPAM 5 MG PO TABS
5.0000 mg | ORAL_TABLET | Freq: Four times a day (QID) | ORAL | Status: DC | PRN
  Administered 2014-03-20 (×2): 5 mg via ORAL
  Filled 2014-03-19 (×2): qty 1

## 2014-03-19 MED ORDER — HYDROMORPHONE HCL 1 MG/ML IJ SOLN
0.2500 mg | INTRAMUSCULAR | Status: DC | PRN
  Administered 2014-03-19: 0.25 mg via INTRAVENOUS
  Administered 2014-03-19 (×2): 0.5 mg via INTRAVENOUS
  Administered 2014-03-19: 0.25 mg via INTRAVENOUS
  Administered 2014-03-19: 0.5 mg via INTRAVENOUS

## 2014-03-19 MED ORDER — SODIUM CHLORIDE 0.9 % IV SOLN
INTRAVENOUS | Status: DC
  Administered 2014-03-19: via INTRAVENOUS

## 2014-03-19 MED ORDER — KETOROLAC TROMETHAMINE 15 MG/ML IJ SOLN
15.0000 mg | Freq: Four times a day (QID) | INTRAMUSCULAR | Status: AC
  Administered 2014-03-19 – 2014-03-20 (×5): 15 mg via INTRAVENOUS
  Filled 2014-03-19 (×5): qty 1

## 2014-03-19 MED ORDER — SODIUM CHLORIDE 0.9 % IJ SOLN
3.0000 mL | Freq: Two times a day (BID) | INTRAMUSCULAR | Status: DC
  Administered 2014-03-19 – 2014-03-20 (×2): 3 mL via INTRAVENOUS

## 2014-03-19 MED ORDER — METHOCARBAMOL 500 MG PO TABS
ORAL_TABLET | ORAL | Status: AC
  Filled 2014-03-19: qty 1

## 2014-03-19 MED ORDER — SODIUM CHLORIDE 0.9 % IJ SOLN
3.0000 mL | INTRAMUSCULAR | Status: DC | PRN
Start: 2014-03-19 — End: 2014-03-21

## 2014-03-19 MED ORDER — DEXAMETHASONE SODIUM PHOSPHATE 10 MG/ML IJ SOLN
INTRAMUSCULAR | Status: AC
  Filled 2014-03-19: qty 1

## 2014-03-19 MED ORDER — ALPRAZOLAM 0.25 MG PO TABS: 0.2500 mg | ORAL_TABLET | Freq: Every day | ORAL | Status: DC | PRN

## 2014-03-19 MED ORDER — CEFAZOLIN SODIUM 1-5 GM-% IV SOLN
1.0000 g | Freq: Three times a day (TID) | INTRAVENOUS | Status: AC
  Administered 2014-03-19 – 2014-03-20 (×2): 1 g via INTRAVENOUS
  Filled 2014-03-19 (×2): qty 50

## 2014-03-19 MED ORDER — THROMBIN 5000 UNITS EX SOLR
OROMUCOSAL | Status: DC | PRN
  Administered 2014-03-19: 17:00:00 via TOPICAL

## 2014-03-19 MED ORDER — MORPHINE SULFATE 2 MG/ML IJ SOLN
1.0000 mg | INTRAMUSCULAR | Status: DC | PRN
  Administered 2014-03-20: 4 mg via INTRAVENOUS
  Administered 2014-03-20 (×2): 2 mg via INTRAVENOUS
  Filled 2014-03-19 (×2): qty 1
  Filled 2014-03-19: qty 2

## 2014-03-19 MED ORDER — LIDOCAINE HCL (CARDIAC) 20 MG/ML IV SOLN
INTRAVENOUS | Status: AC
  Filled 2014-03-19: qty 5

## 2014-03-19 SURGICAL SUPPLY — 64 items
BAG DECANTER FOR FLEXI CONT (MISCELLANEOUS) ×3 IMPLANT
BLADE CLIPPER SURG (BLADE) IMPLANT
BUR MATCHSTICK NEURO 3.0 LAGG (BURR) ×3 IMPLANT
CAGE COROENT MP 8X23 (Cage) ×6 IMPLANT
CANISTER SUCT 3000ML (MISCELLANEOUS) ×3 IMPLANT
CONT SPEC 4OZ CLIKSEAL STRL BL (MISCELLANEOUS) ×6 IMPLANT
COVER BACK TABLE 60X90IN (DRAPES) ×3 IMPLANT
DECANTER SPIKE VIAL GLASS SM (MISCELLANEOUS) ×3 IMPLANT
DERMABOND ADVANCED (GAUZE/BANDAGES/DRESSINGS) ×2
DERMABOND ADVANCED .7 DNX12 (GAUZE/BANDAGES/DRESSINGS) ×1 IMPLANT
DRAPE C-ARM 42X72 X-RAY (DRAPES) ×6 IMPLANT
DRAPE LAPAROTOMY 100X72X124 (DRAPES) ×3 IMPLANT
DRAPE POUCH INSTRU U-SHP 10X18 (DRAPES) ×3 IMPLANT
DRAPE PROXIMA HALF (DRAPES) IMPLANT
DRSG OPSITE POSTOP 4X6 (GAUZE/BANDAGES/DRESSINGS) ×3 IMPLANT
DURAPREP 26ML APPLICATOR (WOUND CARE) ×3 IMPLANT
ELECT REM PT RETURN 9FT ADLT (ELECTROSURGICAL) ×3
ELECTRODE REM PT RTRN 9FT ADLT (ELECTROSURGICAL) ×1 IMPLANT
GAUZE SPONGE 4X4 12PLY STRL (GAUZE/BANDAGES/DRESSINGS) ×3 IMPLANT
GAUZE SPONGE 4X4 16PLY XRAY LF (GAUZE/BANDAGES/DRESSINGS) IMPLANT
GLOVE BIO SURGEON STRL SZ7 (GLOVE) ×6 IMPLANT
GLOVE BIOGEL PI IND STRL 8.5 (GLOVE) ×2 IMPLANT
GLOVE BIOGEL PI INDICATOR 8.5 (GLOVE) ×4
GLOVE ECLIPSE 7.5 STRL STRAW (GLOVE) ×6 IMPLANT
GLOVE ECLIPSE 8.5 STRL (GLOVE) ×6 IMPLANT
GLOVE ECLIPSE 9.0 STRL (GLOVE) ×3 IMPLANT
GLOVE EXAM NITRILE LRG STRL (GLOVE) IMPLANT
GLOVE EXAM NITRILE MD LF STRL (GLOVE) IMPLANT
GLOVE EXAM NITRILE XL STR (GLOVE) IMPLANT
GLOVE EXAM NITRILE XS STR PU (GLOVE) IMPLANT
GLOVE INDICATOR 7.0 STRL GRN (GLOVE) ×3 IMPLANT
GLOVE INDICATOR 7.5 STRL GRN (GLOVE) ×3 IMPLANT
GOWN STRL REUS W/ TWL LRG LVL3 (GOWN DISPOSABLE) ×1 IMPLANT
GOWN STRL REUS W/ TWL XL LVL3 (GOWN DISPOSABLE) ×1 IMPLANT
GOWN STRL REUS W/TWL 2XL LVL3 (GOWN DISPOSABLE) ×6 IMPLANT
GOWN STRL REUS W/TWL LRG LVL3 (GOWN DISPOSABLE) ×2
GOWN STRL REUS W/TWL XL LVL3 (GOWN DISPOSABLE) ×2
HEMOSTAT POWDER KIT SURGIFOAM (HEMOSTASIS) ×3 IMPLANT
KIT BASIN OR (CUSTOM PROCEDURE TRAY) ×3 IMPLANT
KIT INFUSE SMALL (Orthopedic Implant) ×3 IMPLANT
KIT ROOM TURNOVER OR (KITS) ×3 IMPLANT
LIQUID BAND (GAUZE/BANDAGES/DRESSINGS) ×3 IMPLANT
NEEDLE HYPO 22GX1.5 SAFETY (NEEDLE) ×3 IMPLANT
NS IRRIG 1000ML POUR BTL (IV SOLUTION) ×3 IMPLANT
PACK LAMINECTOMY NEURO (CUSTOM PROCEDURE TRAY) ×3 IMPLANT
PAD ARMBOARD 7.5X6 YLW CONV (MISCELLANEOUS) ×9 IMPLANT
PATTIES SURGICAL .5 X1 (DISPOSABLE) ×3 IMPLANT
ROD RELINE-O LORD 5.5X40 (Rod) ×6 IMPLANT
SCREW LOCK RELINE 5.5 TULIP (Screw) ×12 IMPLANT
SCREW RELINE-O POLY 6.5X50MM (Screw) ×12 IMPLANT
SPONGE LAP 4X18 X RAY DECT (DISPOSABLE) IMPLANT
SPONGE SURGIFOAM ABS GEL 100 (HEMOSTASIS) ×3 IMPLANT
SUT VIC AB 1 CT1 18XBRD ANBCTR (SUTURE) ×1 IMPLANT
SUT VIC AB 1 CT1 8-18 (SUTURE) ×2
SUT VIC AB 2-0 CP2 18 (SUTURE) ×3 IMPLANT
SUT VIC AB 3-0 SH 8-18 (SUTURE) ×3 IMPLANT
SYR 20ML ECCENTRIC (SYRINGE) ×3 IMPLANT
SYR 3ML LL SCALE MARK (SYRINGE) ×12 IMPLANT
TOWEL OR 17X24 6PK STRL BLUE (TOWEL DISPOSABLE) ×3 IMPLANT
TOWEL OR 17X26 10 PK STRL BLUE (TOWEL DISPOSABLE) ×3 IMPLANT
TRAP SPECIMEN MUCOUS 40CC (MISCELLANEOUS) ×3 IMPLANT
TRAY FOLEY CATH 14FRSI W/METER (CATHETERS) IMPLANT
TRAY FOLEY METER SIL LF 16FR (CATHETERS) ×3 IMPLANT
WATER STERILE IRR 1000ML POUR (IV SOLUTION) ×3 IMPLANT

## 2014-03-19 NOTE — Progress Notes (Signed)
Iv will be started on 3rd floor neuro.

## 2014-03-19 NOTE — Transfer of Care (Signed)
Immediate Anesthesia Transfer of Care Note  Patient: Daniel Wagner  Procedure(s) Performed: Procedure(s): LUMBAR ONE-TWO POSTERIOR LUMBAR INTERBODY FUSION  (N/A)  Patient Location: PACU  Anesthesia Type:General  Level of Consciousness: awake, alert  and oriented  Airway & Oxygen Therapy: Patient Spontanous Breathing and Patient connected to nasal cannula oxygen  Post-op Assessment: Report given to PACU RN and Post -op Vital signs reviewed and stable  Post vital signs: Reviewed and stable  Complications: No apparent anesthesia complications

## 2014-03-19 NOTE — Anesthesia Postprocedure Evaluation (Signed)
Anesthesia Post Note  Patient: Daniel Wagner  Procedure(s) Performed: Procedure(s) (LRB): LUMBAR ONE-TWO POSTERIOR LUMBAR INTERBODY FUSION  (N/A)  Anesthesia type: general  Patient location: PACU  Post pain: Pain level controlled  Post assessment: Patient's Cardiovascular Status Stable  Last Vitals:  Filed Vitals:   03/19/14 2045  BP: 163/72  Pulse: 63  Temp: 36.3 C  Resp: 16    Post vital signs: Reviewed and stable  Level of consciousness: sedated  Complications: No apparent anesthesia complications

## 2014-03-19 NOTE — Progress Notes (Signed)
Patient ID: Daniel Wagner, male   DOB: [birth year], 73 y.o.   MRN: [id number] Alert. Feels reasonably comfortable Lower extremity function appears intact Dressing is dry.

## 2014-03-19 NOTE — Anesthesia Preprocedure Evaluation (Addendum)
Anesthesia Evaluation  Patient identified by MRN, date of birth, ID band Patient awake    Reviewed: Allergy & Precautions, H&P , NPO status , Patient's Chart, lab work & pertinent test results  Airway Mallampati: II  TM Distance: >3 FB Neck ROM: Full    Dental  (+) Teeth Intact, Dental Advisory Given   Pulmonary neg pulmonary ROS,  breath sounds clear to auscultation        Cardiovascular hypertension, Pt. on medications Rhythm:Regular Rate:Normal     Neuro/Psych    GI/Hepatic Neg liver ROS, GERD-  Controlled and Medicated,(+)     substance abuse  alcohol use,   Endo/Other  negative endocrine ROS  Renal/GU negative Renal ROS     Musculoskeletal  (+) Arthritis -,   Abdominal (+)  Abdomen: soft. Bowel sounds: normal.  Peds  Hematology   Anesthesia Other Findings   Reproductive/Obstetrics                          Anesthesia Physical Anesthesia Plan  ASA: III  Anesthesia Plan: General   Post-op Pain Management:    Induction: Intravenous  Airway Management Planned: Oral ETT  Additional Equipment:   Intra-op Plan:   Post-operative Plan: Extubation in OR  Informed Consent: I have reviewed the patients History and Physical, chart, labs and discussed the procedure including the risks, benefits and alternatives for the proposed anesthesia with the patient or authorized representative who has indicated his/her understanding and acceptance.     Plan Discussed with: CRNA, Anesthesiologist and Surgeon  Anesthesia Plan Comments:         Anesthesia Quick Evaluation

## 2014-03-19 NOTE — H&P (Signed)
Daniel Wagner is an 73 y.o. male.   Chief Complaint: Back and leg pain HPI: Patient is a 73 year old individual who in May 2012 underwent surgical decompression from L2-L5 for severe spondylitic stenosis. He had back pain and bilateral leg pain at the time. The patient did well after the surgery and in the interval he's had cervical spondylitic disease which was decompressed more recently. In the last 6 months the patient developed increasing back and leg pain again now with weakness in his legs. A recent MRI demonstrates the presence of severe spondylitic stenosis at L1-L2 with compression of his conus. He's been advised regarding the need for surgery to decompress and stabilize L1-L2. L5-S1 has some modest spondylosis but not nearly so severe as to require any surgical intervention at this time.  Past Medical History  Diagnosis Date  . Hypertension   . GERD (gastroesophageal reflux disease) Hx in past.  . Arthritis   . Cancer     Skin cancer- Basal cell  . History of kidney stones 12/14    Lithotrispy    Past Surgical History  Procedure Laterality Date  . Back surgery  08/2010    lumbar fusion  . Neck surgery      x2  . Cholecystectomy  1990's  . Transurethral resection of prostate  1999  . Sinus surgeries      x2  . Skin cancer excision      arm chest and head basal cell  . Anterior cervical decompression/discectomy fusion 4 levels N/A 06/30/2012    Procedure: ANTERIOR CERVICAL DECOMPRESSION/DISCECTOMY FUSION 4 LEVELS;  Surgeon: Kristeen Miss, MD;  Location: [facility];  Service: Neurosurgery;  Laterality: N/A;  C3-4 C4-5 C5-6 C6-7 Anterior cervical decompression/diskectomy/fusion    History reviewed. No pertinent family history. Social History:  reports that he has never smoked. He does not have any smokeless tobacco history on file. He reports that he drinks about 4.2 oz of alcohol per week. He reports that he does not use illicit drugs.  Allergies:  Allergies  Allergen Reactions   . Aspirin Other (See Comments)    Jittery if taking large quantities    Medications Prior to Admission  Medication Sig Dispense Refill  . ALPRAZolam (XANAX) 0.25 MG tablet Take 0.25 mg by mouth daily as needed for anxiety.    Marland Kitchen doxazosin (CARDURA) 4 MG tablet Take 4 mg by mouth daily.     . fluticasone (FLONASE) 50 MCG/ACT nasal spray Place 2 sprays into the nose daily as needed (for stuffiness).    Marland Kitchen guaiFENesin (MUCINEX) 600 MG 12 hr tablet Take 600 mg by mouth daily.    Marland Kitchen lisinopril (PRINIVIL,ZESTRIL) 20 MG tablet Take 20 mg by mouth every evening.     . lovastatin (MEVACOR) 40 MG tablet Take 40 mg by mouth at bedtime.    . meloxicam (MOBIC) 7.5 MG tablet Take 7.5 mg by mouth 2 (two) times daily.    Vladimir Faster Glycol-Propyl Glycol (SYSTANE ULTRA) 0.4-0.3 % SOLN Apply 1 drop to eye 2 (two) times daily.    . Probiotic Product (PRO-BIOTIC BLEND PO) Take 1 capsule by mouth daily.    . Testosterone (ANDROGEL) 40.5 MG/2.5GM (1.62%) GEL Place 2 Squirts onto the skin every evening.     . diazepam (VALIUM) 5 MG tablet Take 1 tablet (5 mg total) by mouth every 6 (six) hours as needed (Muscle spasm). (Patient not taking: Reported on 03/15/2014) 30 tablet 0  . HYDROcodone-acetaminophen (NORCO/VICODIN) 5-325 MG per tablet Take 1-2 tablets  by mouth 2 (two) times daily as needed for pain. 40 tablet 0    Results for orders placed or performed during the hospital encounter of 03/18/14 (from the past 48 hour(s))  Surgical pcr screen     Status: None   Collection Time: 03/18/14  1:32 PM  Result Value Ref Range   MRSA, PCR NEGATIVE NEGATIVE   Staphylococcus aureus NEGATIVE NEGATIVE    Comment:        The Xpert SA Assay (FDA approved for NASAL specimens in patients over 37 years of age), is one component of a comprehensive surveillance program.  Test performance has been validated by [employer] for patients greater than or equal to 47 year old. It is not intended to diagnose infection nor  to guide or monitor treatment.   Basic metabolic panel     Status: Abnormal   Collection Time: 03/18/14  1:32 PM  Result Value Ref Range   Sodium 138 137 - 147 mEq/L   Potassium 4.2 3.7 - 5.3 mEq/L   Chloride 101 96 - 112 mEq/L   CO2 24 19 - 32 mEq/L   Glucose, Bld 111 (H) 70 - 99 mg/dL   BUN 17 6 - 23 mg/dL   Creatinine, Ser 1.01 0.50 - 1.35 mg/dL   Calcium 9.3 8.4 - 10.5 mg/dL   GFR calc non Af Amer 73 (L) >90 mL/min   GFR calc Af Amer 84 (L) >90 mL/min    Comment: (NOTE) The eGFR has been calculated using the CKD EPI equation. This calculation has not been validated in all clinical situations. eGFR's persistently <90 mL/min signify possible Chronic Kidney Disease.    Anion gap 13 5 - 15  CBC     Status: None   Collection Time: 03/18/14  1:32 PM  Result Value Ref Range   WBC 7.8 4.0 - 10.5 K/uL   RBC 4.36 4.22 - 5.81 MIL/uL   Hemoglobin 13.9 13.0 - 17.0 g/dL   HCT 40.7 39.0 - 52.0 %   MCV 93.3 78.0 - 100.0 fL   MCH 31.9 26.0 - 34.0 pg   MCHC 34.2 30.0 - 36.0 g/dL   RDW 13.6 11.5 - 15.5 %   Platelets 191 150 - 400 K/uL  Type and screen     Status: None   Collection Time: 03/18/14  1:32 PM  Result Value Ref Range   ABO/RH(D) A POS    Antibody Screen NEG    Sample Expiration 04/01/2014    No results found.  Review of Systems  HENT: Negative.   Eyes: Negative.   Respiratory: Negative.   Cardiovascular: Negative.   Gastrointestinal: Negative.   Genitourinary: Negative.   Musculoskeletal: Positive for back pain.  Skin: Negative.   Neurological: Positive for focal weakness and weakness.       Weakness of the lower extremities with limited ability to walk any distance  Endo/Heme/Allergies: Negative.   Psychiatric/Behavioral: Negative.     Blood pressure 141/78, pulse 70, temperature 97.4 F (36.3 C), temperature source Oral, resp. rate 18, height 6' 2"  (1.88 m), weight 86.24 kg (190 lb 2 oz), SpO2 100 %. Physical Exam  Constitutional: He is oriented to person,  place, and time. He appears well-developed and well-nourished.  HENT:  Head: Normocephalic and atraumatic.  Eyes: Conjunctivae and EOM are normal. Pupils are equal, round, and reactive to light.  Neck: Normal range of motion. Neck supple.  Cardiovascular: Normal rate and regular rhythm.   Respiratory: Effort normal and breath sounds normal.  GI: Soft. Bowel sounds are normal.  Musculoskeletal:  Patient has significant diffuse weakness in lower extremities including the iliopsoas quad tibialis anterior and gastrocs. He walks with a wide-based gait is unable to stand onto his toes or his heels .  Neurological: He is alert and oriented to person, place, and time.  Skin: Skin is warm and dry.  Psychiatric: He has a normal mood and affect. His behavior is normal. Judgment and thought content normal.     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  Admit to undergo surgical decompression L1-L2. Table was a she will also be performed.  Oluwatomiwa Kinyon J 03/19/2014, 1:23 PM

## 2014-03-19 NOTE — Anesthesia Procedure Notes (Signed)
Procedure Name: Intubation Date/Time: 03/19/2014 3:50 PM Performed by: Maeola Harman Pre-anesthesia Checklist: Patient identified, Emergency Drugs available, Suction available, Patient being monitored and Timeout performed Patient Re-evaluated:Patient Re-evaluated prior to inductionOxygen Delivery Method: Circle system utilized Preoxygenation: Pre-oxygenation with 100% oxygen Intubation Type: IV induction Ventilation: Mask ventilation without difficulty Laryngoscope Size: Mac and 3 Grade View: Grade I Tube size: 7.5 mm Number of attempts: 1 Airway Equipment and Method: Stylet Placement Confirmation: ETT inserted through vocal cords under direct vision,  positive ETCO2 and breath sounds checked- equal and bilateral Secured at: 21 cm Tube secured with: Tape Dental Injury: Teeth and Oropharynx as per pre-operative assessment

## 2014-03-19 NOTE — Op Note (Signed)
Date of surgery: 03/19/2014 Preoperative diagnosis: Spondylosis and stenosis L1-L2, status post arthrodesis L2-L5 for degenerative spondylosis and scoliosis. Postoperative diagnosis: Spondylosis and stenosis L1-L2, status post arthrodesis L2-L5 for degenerative spondylosis and scoliosis. Procedure: Decompression L1-L2 via laminectomy and total discectomy with more work than require for simple posterior interbody arthrodesis, decompression of L1 and L2 nerve roots, posterior interbody arthrodesis with peek spacers local autograft and allograft, pedicle screw fixation L1-L2 with posterior lateral arthrodesis L1-L2. Local autograft. Surgeon: Kristeen Miss Asst.: Deri Fuelling M.D. Anesthesia: Gen. endotracheal Indications: Patient is a 73 year old individual who's had significant problems with back pain and leg weakness. He has developed a significant spondylosis and stenosis at L1-L2. Is advised regarding the need for surgical decompression and arthrodesis.  Procedure: The patient was brought to the operating room supine on a stretcher. After the smooth induction of general endotracheal anesthesia, he was turned prone. Bony prominences were appropriately padded and protected. The back was prepped with alcohol DuraPrep and draped in a sterile fashion. The superior portion of his previously made midline incision was reopened and the dissection was carried down to the lumbar dorsal fascia. The spinous process and interspace was identified radiographically as L1-L2. A subperiosteal dissection was then taken out to the lateral gutters and the superior aspect of the previously placed hardware at L2 was identified. The rods below the L2 pedicle screws were identified and a portion of these were cleared. The spinous process at L1 and L2 were identified. Is planned to cut the rod below the pedicle screw and remove the L2 pedicle screw and perform a separate arthrodesis at L1-L2. In order to facilitate rod cutting the  spinous process of L2 had to be removed. Once this was removed then and in situ rod cutter was used to cut the rod. The pedicle screw was released at L2 on either side. Attention was then turned to the L1-L2 interspace. Decompression was undertaken by performing a laminectomy of L1 removing the entirety of the facets and laminar arch up to the region of the pars. A portion of the lamina with the spinous process was left attached for articulation at T12-L1. Then the common dural tube was explored. Redundant thickened yellow ligament was removed. Dissection was carried out laterally and the L1 nerve root was decompressed. The disc space at L1-L2 was identified. There is significant disc material protruding dorsally into the spinal canal. This was removed carefully a piecemeal fashion. Ultimately the common dural tube and region of the disc space was decompressed. Then by using an interspinous distractor were able to open the disc space. The disc space was noted to be markedly collapsed. After some prying the endplates could be separated and gradually an interbody spreader could be placed into the disc space. There was a little actual disc material left in the disc space. Most of it had been highly degraded and forced out either ventrally or dorsally. The disc space was cleaned and the endplates were then decorticated. Ultimately an interbody sizer could be placed and was felt that an 8 mm interbody spacer would fit best into this interval. Once the endplates were completely decorticated all the autograft that was harvested from the laminectomy and the spinous process that was taken was prepared and this was placed with 2 small pieces of infuse into the interspace. 82mm spacers were then tamped into the interspace. Radiographic confirmation of the distraction of the interspace at L1-L2 along with placement of the peek spacers was obtained.  At this point pedicle  screws were placed and L1. These were done by first  sounding the pedicle with a probe tapping to 6.5 mm tap checking for any cut out and placing 6.5 x 50 mm screws in each side of L1. The L2 screws had been removed and these were 6.5 x 50 mm screws that were replaced with nuvasive type screws. 40 mm precontoured rods were placed in a neutral construct in the L1-L2 interval. Prior to doing this lateral gutters had been decorticated and the intertransverse space between L1 and L2. Additional small pieces of infuse was placed in the intertransverse space along with some autograft. Once the grafting was completed the patency of the common dural tube the take off of the L1 the L2 nerve root were all checked to make sure that no bone graft or debris had phone in the pads. Once this was verified hemostasis and the soft tissues was obtained the lumbar dorsal fascia was closed with #1 Vicryl in interrupted fashion, 2-0 Vicryl was used in the subcutaneous tissues, 3-0 Vicryl to close the subcuticular skin. Dermabond was then placed on the skin. Blood loss was estimated at about 550 mL, 200 mL of Cell Saver blood was returned to the patient.

## 2014-03-20 LAB — BASIC METABOLIC PANEL
Anion gap: 14 (ref 5–15)
BUN: 19 mg/dL (ref 6–23)
CHLORIDE: 100 meq/L (ref 96–112)
CO2: 21 mEq/L (ref 19–32)
Calcium: 8.4 mg/dL (ref 8.4–10.5)
Creatinine, Ser: 0.92 mg/dL (ref 0.50–1.35)
GFR, EST NON AFRICAN AMERICAN: 83 mL/min — AB (ref >90)
Glucose, Bld: 146 mg/dL — ABNORMAL HIGH (ref 70–99)
POTASSIUM: 4.8 meq/L (ref 3.7–5.3)
SODIUM: 135 meq/L — AB (ref 137–147)

## 2014-03-20 LAB — CBC
HCT: 36.1 % — ABNORMAL LOW (ref 39.0–52.0)
Hemoglobin: 12.2 g/dL — ABNORMAL LOW (ref 13.0–17.0)
MCH: 31.4 pg (ref 26.0–34.0)
MCHC: 33.8 g/dL (ref 30.0–36.0)
MCV: 93 fL (ref 78.0–100.0)
Platelets: 156 10*3/uL (ref 150–400)
RBC: 3.88 MIL/uL — ABNORMAL LOW (ref 4.22–5.81)
RDW: 13.8 % (ref 11.5–15.5)
WBC: 10.5 10*3/uL (ref 4.0–10.5)

## 2014-03-20 MED FILL — Heparin Sodium (Porcine) Inj 1000 Unit/ML: INTRAMUSCULAR | Qty: 30 | Status: AC

## 2014-03-20 MED FILL — Sodium Chloride IV Soln 0.9%: INTRAVENOUS | Qty: 1000 | Status: AC

## 2014-03-20 MED FILL — Sodium Chloride Irrigation Soln 0.9%: Qty: 3000 | Status: AC

## 2014-03-20 NOTE — Plan of Care (Signed)
Problem: Phase I Progression Outcomes Goal: Log roll for position change Outcome: Completed/Met Date Met:  03/20/14     

## 2014-03-20 NOTE — Plan of Care (Signed)
Problem: Phase I Progression Outcomes Goal: Hemodynamically stable Outcome: Completed/Met Date Met:  03/20/14     

## 2014-03-20 NOTE — Progress Notes (Signed)
Patient ID: Daniel Wagner, male   DOB: [birth year], 73 y.o.   MRN: [id number] Vss.  ambulating  foley out  stable post op

## 2014-03-20 NOTE — Progress Notes (Signed)
Orthopedic Tech Progress Note Patient Details:  Daniel Wagner [birth year] [id number]  Patient ID: [name], male   DOB: 06-01-1940, 73 y.o.   MRN: [id number] Brace order completed by Warnell Forester, Deryl Giroux 03/20/2014, 7:57 AM

## 2014-03-20 NOTE — Progress Notes (Signed)
Nutrition Brief Note  Malnutrition Screening Tool result is inaccurate.  Please consult if nutrition needs are identified.  Yehia Mcbain Barnett RD, LDN Inpatient Clinical Dietitian Pager: [phone] After Hours Pager: [phone]  

## 2014-03-20 NOTE — Progress Notes (Signed)
Occupational Therapy Evaluation Patient Details Name: Daniel Wagner MRN: [id number] DOB: [birth year] Today's Date: 03/20/2014    History of Present Illness 73 yo male s/p L1-2 posterior interbody arthrodesis with peek spacers local autograft and allograft pedicle screw fixation. pt with back brace. PMH: HTN, Gerd Arthritis, Kidney stones, back surg 2012, neck surg x2, sinus surg x2   Clinical Impression   Patient is s/p above surgery resulting in functional limitations due to the deficits listed below (see OT problem list). Patient reports feeling very good despite normal surgical pain. This is the patient's 2nd back surgery. Pt educated on back precautions and compensatory techniques for ADLs to facilitate adherence to those precautions. Patient will benefit from skilled OT acutely to increase independence and safety with ADLS to allow discharge home.    Follow Up Recommendations  No OT follow up;Supervision/Assistance - 24 hour    Equipment Recommendations  None recommended by OT    Recommendations for Other Services       Precautions / Restrictions Precautions Precautions: Back Precaution Booklet Issued: Yes (comment) Precaution Comments: back handout provided and reviewed. Pt verbalized 1/3 back precautions at end of session. Required Braces or Orthoses: Spinal Brace Spinal Brace: Lumbar corset;Applied in sitting position Restrictions Weight Bearing Restrictions: No      Mobility Bed Mobility Overal bed mobility: Needs Assistance Bed Mobility: Rolling;Sidelying to Sit Rolling: Supervision Sidelying to sit: Supervision       General bed mobility comments: HOB flat; use of bedrails. Verbal cues for log roll sequence.  Transfers Overall transfer level: Needs assistance Equipment used: Rolling walker (2 wheeled) Transfers: Sit to/from Stand Sit to Stand: Min guard         General transfer comment: Verbal cues for hand placement and to maintain back precautions.     Balance Overall balance assessment: Needs assistance Sitting-balance support: No upper extremity supported;Feet supported Sitting balance-Leahy Scale: Good Sitting balance - Comments: Able to weight shift laterally to remove gown from underneath him without LOB   Standing balance support: No upper extremity supported;During functional activity Standing balance-Leahy Scale: Fair Standing balance comment: Pt able to complete ADLs at sink and ambulate short distance in bathroom without UE support on RW, but requires bil UE support for longer distances.                            ADL Overall ADL's : Needs assistance/impaired     Grooming: Wash/dry hands;Wash/dry face;Oral care;Min guard;Standing;Cueing for compensatory techniques;Cueing for safety Grooming Details (indicate cue type and reason): Verbal cues for safe RW use         Upper Body Dressing : Min guard;Sitting Upper Body Dressing Details (indicate cue type and reason): Pt donned brace sitting EOB with directional verbal cues Lower Body Dressing: Maximal assistance;Sitting/lateral leans Lower Body Dressing Details (indicate cue type and reason): Pt unable to cross ankle across knee to reach LB to don socks. Required max (A) Toilet Transfer: Min guard;Cueing for safety;Ambulation;BSC;RW Toilet Transfer Details (indicate cue type and reason): Min guard (A) for safety/balance.  Toileting- Clothing Manipulation and Hygiene: Minimal assistance;Cueing for sequencing;Adhering to back precautions;Sit to/from stand Toileting - Clothing Manipulation Details (indicate cue type and reason): Min (A) to move gown prior to descend onto toilet.      Functional mobility during ADLs: Min guard;Cueing for safety;Rolling walker General ADL Comments: Pt is a little bit anxious and impulsive at times. Pt tends to leave RW behind in the bathroom  and needed directional verbal cues for safe RW use during ADLs. Pt educated and practiced  compensatory technique (using 2 cups) for oral care and to keep items on L side (dominant) to avoid twisting.      Vision                     Perception     Praxis      Pertinent Vitals/Pain Pain Assessment: 0-10 Pain Score: 7  Pain Location: incision site (low back) Pain Descriptors / Indicators: Sore Pain Intervention(s): Monitored during session;Repositioned     Hand Dominance Left   Extremity/Trunk Assessment Upper Extremity Assessment Upper Extremity Assessment: Overall WFL for tasks assessed   Lower Extremity Assessment Lower Extremity Assessment: Overall WFL for tasks assessed   Cervical / Trunk Assessment Cervical / Trunk Assessment: Normal   Communication Communication Communication: No difficulties   Cognition Arousal/Alertness: Awake/alert Behavior During Therapy: WFL for tasks assessed/performed Overall Cognitive Status: Within Functional Limits for tasks assessed       Memory: Decreased recall of precautions             General Comments       Exercises       Shoulder Instructions      Home Living Family/patient expects to be discharged to:: Private residence Living Arrangements: Spouse/significant other Available Help at Discharge: Family;Available 24 hours/day Type of Home: Other(Comment) (Townhouse) Home Access: Level entry     Home Layout: One level     Bathroom Shower/Tub: Occupational psychologist: Handicapped height     Home Equipment: Environmental consultant - 2 wheels;Cane - single point;Bedside commode;Shower seat - built in;Grab bars - tub/shower;Hand held shower head;Other (comment) (Reacher)          Prior Functioning/Environment Level of Independence: Independent        Comments: Likes cars, traveling and walking    OT Diagnosis: Generalized weakness;Acute pain   OT Problem List: Decreased strength;Decreased range of motion;Decreased activity tolerance;Impaired balance (sitting and/or standing);Decreased  safety awareness;Decreased knowledge of use of DME or AE;Decreased knowledge of precautions;Pain   OT Treatment/Interventions: Self-care/ADL training;Therapeutic exercise;DME and/or AE instruction;Therapeutic activities;Patient/family education;Balance training    OT Goals(Current goals can be found in the care plan section) Acute Rehab OT Goals Patient Stated Goal: to go home OT Goal Formulation: With patient Time For Goal Achievement: 04/03/14 Potential to Achieve Goals: Good ADL Goals Pt Will Perform Lower Body Dressing: with modified independence;with adaptive equipment;sit to/from stand Pt Will Transfer to Toilet: with modified independence;ambulating;bedside commode Pt Will Perform Toileting - Clothing Manipulation and hygiene: with modified independence;sit to/from stand Pt Will Perform Tub/Shower Transfer: Shower transfer;with supervision;ambulating;shower seat;rolling walker;grab bars Additional ADL Goal #1: Pt will verbalize 3/3 back precautions with 100% accuracy prior to discharge.  OT Frequency: Min 2X/week   Barriers to D/C:            Co-evaluation              End of Session Equipment Utilized During Treatment: Gait belt;Rolling walker;Back brace Nurse Communication: Mobility status;Precautions  Activity Tolerance: Patient tolerated treatment well Patient left: in chair;with call bell/phone within reach;with chair alarm set   Time: [phone] OT Time Calculation (min): 38 min Charges:    G-Codes:    Redmond Baseman 04-13-2014, 9:21 AM

## 2014-03-20 NOTE — Plan of Care (Signed)
Problem: Consults Goal: Spinal Surgery Patient Education See Patient Education Module for education specifics. Outcome: Completed/Met Date Met:  03/20/14     

## 2014-03-20 NOTE — Progress Notes (Signed)
Patient arrived from PACU around 2100 alert and oriented complaining of pain, patient received pain medication and resting quietly will continue to monitor.

## 2014-03-20 NOTE — Evaluation (Signed)
Physical Therapy Evaluation Patient Details Name: Daniel Wagner MRN: [id number] DOB: March 23, 1941 Today's Date: 03/20/2014   History of Present Illness  73 yo male s/p L1-2 posterior interbody arthrodesis with peek spacers local autograft and allograft pedicle screw fixation. pt with back brace. PMH: HTN, Gerd Arthritis, Kidney stones, back surg 2012, neck surg x2, sinus surg x2    Clinical Impression  Patient presents with functional limitations due to deficits listed in PT problem list (see below). Pt with lethargy due to pain medication limiting safe mobility and evaluation. Education provided on back precautions. Able to verbalize 2/3 precautions within session. Ambulation distance limited due to above. Pt would benefit from skilled PT to improve transfers, gait and balance so pt can maximize independence and return to PLOF.     Follow Up Recommendations No PT follow up;Supervision - Intermittent    Equipment Recommendations  None recommended by PT    Recommendations for Other Services       Precautions / Restrictions Precautions Precautions: Back Precaution Booklet Issued: No Precaution Comments: Reviewed 3/3 back precautions. Able to recall 2/3 from previous session with OT. Required Braces or Orthoses: Spinal Brace Spinal Brace: Lumbar corset;Applied in sitting position Restrictions Weight Bearing Restrictions: No      Mobility  Bed Mobility Overal bed mobility: Needs Assistance Bed Mobility: Rolling;Sidelying to Sit;Sit to Sidelying Rolling: Supervision Sidelying to sit: Min guard     Sit to sidelying: Min assist General bed mobility comments: HOB flat, no use of rails to simulate home environment. VC's for log roll technique. Min A to bring LEs into bed.  Transfers Overall transfer level: Needs assistance Equipment used: Rolling walker (2 wheeled) Transfers: Sit to/from Stand Sit to Stand: Min guard         General transfer comment: Min guard for safety due to  feeling "woozy" from pain medication.  Ambulation/Gait Ambulation/Gait assistance: Min guard Ambulation Distance (Feet): 12 Feet Assistive device: Rolling walker (2 wheeled) Gait Pattern/deviations: Step-through pattern;Decreased stride length   Gait velocity interpretation: Below normal speed for age/gender General Gait Details: Pt with slow, steady gait. VC's to keep RW on ground during gait. Distance limited due to lethargy from pain medication.  Stairs            Wheelchair Mobility    Modified Rankin (Stroke Patients Only)       Balance Overall balance assessment: Needs assistance Sitting-balance support: No upper extremity supported;Feet supported Sitting balance-Leahy Scale: Good Sitting balance - Comments: Able to doff LSO sitting EOB without difficulty.   Standing balance support: During functional activity;Bilateral upper extremity supported Standing balance-Leahy Scale: Poor Standing balance comment: Requires BUE support during dynamic standing for balance. Able to stand for short periods without UE support.                             Pertinent Vitals/Pain Pain Assessment: 0-10 Pain Score: 9  Pain Location: low back at surgical site Pain Descriptors / Indicators: Sore;Sharp Pain Intervention(s): Limited activity within patient's tolerance;RN gave pain meds during session;Monitored during session;Repositioned    Home Living Family/patient expects to be discharged to:: Private residence Living Arrangements: Spouse/significant other Available Help at Discharge: Family;Available 24 hours/day Type of Home: Other(Comment) Home Access: Level entry     Home Layout: One level Home Equipment: Walker - 2 wheels;Cane - single point;Bedside commode;Shower seat - built in;Grab bars - tub/shower;Hand held shower head;Other (comment)      Prior Function Level  of Independence: Independent         Comments: Likes cars, traveling and walking      Hand Dominance   Dominant Hand: Left    Extremity/Trunk Assessment   Upper Extremity Assessment: Overall WFL for tasks assessed;Defer to OT evaluation           Lower Extremity Assessment: Overall WFL for tasks assessed      Cervical / Trunk Assessment: Normal  Communication   Communication: No difficulties  Cognition Arousal/Alertness: Lethargic;Suspect due to medications (Given morphine at beginning of session - resulting in lethargy and limited evaluation.) Behavior During Therapy: [facility] for tasks assessed/performed Overall Cognitive Status: Within Functional Limits for tasks assessed       Memory: Decreased recall of precautions              General Comments General comments (skin integrity, edema, etc.): Surgical site - clean, dry and intact. Family present during session.    Exercises        Assessment/Plan    PT Assessment Patient needs continued PT services  PT Diagnosis Difficulty walking   PT Problem List Pain;Decreased cognition;Decreased activity tolerance;Decreased balance;Decreased mobility;Decreased knowledge of precautions  PT Treatment Interventions Balance training;Gait training;Patient/family education;Functional mobility training;Therapeutic activities;Therapeutic exercise   PT Goals (Current goals can be found in the Care Plan section) Acute Rehab PT Goals Patient Stated Goal: to go home PT Goal Formulation: With patient Time For Goal Achievement: 04/03/14 Potential to Achieve Goals: Good    Frequency Min 5X/week   Barriers to discharge        Co-evaluation               End of Session Equipment Utilized During Treatment: Gait belt Activity Tolerance: Patient limited by lethargy;Other (comment) (due to pain medications.) Patient left: in bed;with call bell/phone within reach;with bed alarm set;with family/visitor present Nurse Communication: Mobility status         Time: 4469-5072 PT Time Calculation (min) (ACUTE  ONLY): 21 min   Charges:   PT Evaluation $Initial PT Evaluation Tier I: 1 Procedure PT Treatments $Therapeutic Activity: 8-22 mins   PT G CodesCandy Wagner A 03/20/2014, 10:21 AM Daniel Wagner, PT, DPT [phone]

## 2014-03-21 MED ORDER — HYDROCODONE-ACETAMINOPHEN 5-325 MG PO TABS
1.0000 | ORAL_TABLET | ORAL | Status: DC | PRN
Start: 2014-03-21 — End: 2015-02-04

## 2014-03-21 MED ORDER — DIAZEPAM 5 MG PO TABS
5.0000 mg | ORAL_TABLET | Freq: Four times a day (QID) | ORAL | Status: DC | PRN
Start: 2014-03-21 — End: 2016-03-01

## 2014-03-21 NOTE — Progress Notes (Signed)
Physical Therapy Treatment Patient Details Name: Daniel Wagner MRN: [id number] DOB: [birth year] Today's Date: 03/21/2014    History of Present Illness 73 yo male s/p L1-2 posterior interbody arthrodesis with peek spacers local autograft and allograft pedicle screw fixation. pt with back brace. PMH: HTN, Gerd Arthritis, Kidney stones, back surg 2012, neck surg x2, sinus surg x2    PT Comments    Patient progressing well with mobility. Tolerated gait training without use of RW. Tolerated higher level balance challenges with mild deviation in gait speed but no overt LOB however still supervision without AD. Able to verbalize 3/3 back precautions. Will continue to progress mobility and higher level balance without use of RW to allow pt to return to independence. Will follow acutely.   Follow Up Recommendations  No PT follow up;Supervision - Intermittent     Equipment Recommendations  None recommended by PT    Recommendations for Other Services       Precautions / Restrictions Precautions Precautions: Back Precaution Comments: Able to recall 3/3 back precautions without cues. Required Braces or Orthoses: Spinal Brace Spinal Brace: Lumbar corset;Applied in sitting position Restrictions Weight Bearing Restrictions: No    Mobility  Bed Mobility               General bed mobility comments: Received sitting in recliner upon PT arrival.  Transfers Overall transfer level: Needs assistance Equipment used: None Transfers: Sit to/from Stand Sit to Stand: Supervision         General transfer comment: Stood from recliner x2, from toilet x1. Good demonstration of technique.  Ambulation/Gait Ambulation/Gait assistance: Modified independent (Device/Increase time);Supervision Ambulation Distance (Feet): 300 Feet (x2 bouts) Assistive device: None;Rolling walker (2 wheeled) Gait Pattern/deviations: Step-through pattern;Decreased stride length Gait velocity: 2.7 ft/sec Gait velocity  interpretation: >2.62 ft/sec, indicative of independent community ambulator General Gait Details: Ambulated with and without RW. Mod I with RW and supervision without RW.  Demonstrates good balance without use of RW- performing higher level balance challenges without difficulty. See balance section.   Stairs            Wheelchair Mobility    Modified Rankin (Stroke Patients Only)       Balance Overall balance assessment: Needs assistance Sitting-balance support: Feet supported;No upper extremity supported Sitting balance-Leahy Scale: Good     Standing balance support: During functional activity Standing balance-Leahy Scale: Good Standing balance comment: Able to perform dynamic head movements, turns and sudden stops without use of UE support. No overt LOB.             High level balance activites: Side stepping;Backward walking;Direction changes;Turns;Sudden stops;Head turns High Level Balance Comments: Tolerated above activities without deviations in gait or overt LOB. Able to step over objects without LOB with mild slowing of gait speed.    Cognition Arousal/Alertness: Awake/alert Behavior During Therapy: WFL for tasks assessed/performed Overall Cognitive Status: Within Functional Limits for tasks assessed       Memory: Decreased recall of precautions              Exercises      General Comments        Pertinent Vitals/Pain Pain Assessment: No/denies pain Pain Score: 5  Pain Location: low back at surgical site Pain Descriptors / Indicators: Sore Pain Intervention(s): Monitored during session;Repositioned;Premedicated before session    Home Living                      Prior Function  PT Goals (current goals can now be found in the care plan section) Acute Rehab PT Goals Patient Stated Goal: to go home Progress towards PT goals: Progressing toward goals    Frequency  Min 5X/week    PT Plan Current plan remains  appropriate    Co-evaluation             End of Session Equipment Utilized During Treatment: Gait belt Activity Tolerance: Patient tolerated treatment well Patient left: in chair;with call bell/phone within reach;with family/visitor present     Time: 1010-1033 PT Time Calculation (min) (ACUTE ONLY): 23 min  Charges:  $Gait Training: 8-22 mins $Neuromuscular Re-education: 8-22 mins                    G CodesCandy Sledge A April 03, 2014, 11:24 AM  Candy Sledge, PT, DPT [phone]

## 2014-03-21 NOTE — Progress Notes (Signed)
Oncall MD paged per pt request. Pt stated that he was told that he was going home today and has not been seen by Md. Pending call back.

## 2014-03-21 NOTE — Progress Notes (Signed)
Pt ambulated to the bathroom with assist by this nurse with rolling walker, brace in place. Pt sitting up in a chair to eat breakfast.

## 2014-03-21 NOTE — Discharge Summary (Signed)
Physician Discharge Summary  Patient ID: Sarthak Rubenstein MRN: [id number] DOB/AGE: 08-27-1940 73 y.o.  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 He is ambulatory. His pain is well-controlled.  Consults: None  Significant Diagnostic Studies: None  Treatments: surgery: Decompression L1-L2 posterior lumbar interbody arthrodesis with local autograft posterior lateral arthrodesis L1-L2 with local autograft, infuse pedicle screw fixation L1-L2.  Discharge Exam: Blood pressure 131/65, pulse 104, temperature 98.2 F (36.8 C), temperature source Oral, resp. rate 18, height 6\' 2"  (1.88 m), weight 92.4 kg (203 lb 11.3 oz), SpO2 100 %. His incision remains clean and dry. Motor function is intact in iliopsoas quadriceps tibialis anterior and gastrocs.  Disposition: 01-Home or Self Care  Discharge Instructions    Call MD for:  redness, tenderness, or signs of infection (pain, swelling, redness, odor or green/yellow discharge around incision site)    Complete by:  As directed      Call MD for:  severe uncontrolled pain    Complete by:  As directed      Call MD for:  temperature >100.4    Complete by:  As directed      Diet - low sodium heart healthy    Complete by:  As directed      Discharge instructions    Complete by:  As directed   Okay to shower. Do not apply salves or appointments to incision. No heavy lifting with the upper extremities greater than 15 pounds. May resume driving when not requiring pain medication and patient feels comfortable with doing so.     Increase activity slowly    Complete by:  As  directed             Medication List    TAKE these medications        ALPRAZolam 0.25 MG tablet  Commonly known as:  XANAX  Take 0.25 mg by mouth daily as needed for anxiety.     ANDROGEL 40.5 MG/2.5GM (1.62%) Gel  Generic drug:  Testosterone  Place 2 Squirts onto the skin every evening.     diazepam 5 MG tablet  Commonly known as:  VALIUM  Take 1 tablet (5 mg total) by mouth every 6 (six) hours as needed (Muscle spasm).     diazepam 5 MG tablet  Commonly known as:  VALIUM  Take 1 tablet (5 mg total) by mouth every 6 (six) hours as needed for anxiety.     doxazosin 4 MG tablet  Commonly known as:  CARDURA  Take 4 mg by mouth daily.     fluticasone 50 MCG/ACT nasal spray  Commonly known as:  FLONASE  Place 2 sprays into the nose daily as needed (for stuffiness).     HYDROcodone-acetaminophen 5-325 MG per tablet  Commonly known as:  NORCO/VICODIN  Take 1-2 tablets by mouth 2 (two) times daily as needed for pain.     HYDROcodone-acetaminophen 5-325 MG per tablet  Commonly known as:  NORCO/VICODIN  Take 1-2 tablets by mouth every 4 (four) hours as needed for moderate pain.     lisinopril 20 MG tablet  Commonly known as:  PRINIVIL,ZESTRIL  Take 20 mg by mouth every evening.     lovastatin 40 MG  tablet  Commonly known as:  MEVACOR  Take 40 mg by mouth at bedtime.     meloxicam 7.5 MG tablet  Commonly known as:  MOBIC  Take 7.5 mg by mouth 2 (two) times daily.     MUCINEX 600 MG 12 hr tablet  Generic drug:  guaiFENesin  Take 600 mg by mouth daily.     PRO-BIOTIC BLEND PO  Take 1 capsule by mouth daily.     SYSTANE ULTRA 0.4-0.3 % Soln  Generic drug:  Polyethyl Glycol-Propyl Glycol  Apply 1 drop to eye 2 (two) times daily.         SignedEarleen Newport 03/21/2014, 6:54 PM

## 2014-03-21 NOTE — Progress Notes (Signed)
Occupational Therapy Treatment Patient Details Name: Waylin Dorko MRN: [id number] DOB: December 26, 1940 Today's Date: 03/21/2014    History of present illness 73 yo male s/p L1-2 posterior interbody arthrodesis with peek spacers local autograft and allograft pedicle screw fixation. pt with back brace. PMH: HTN, Gerd Arthritis, Kidney stones, back surg 2012, neck surg x2, sinus surg x2   OT comments  Patient progressing well with mobility and ADL retraining. Patient demonstrating good use of compensatory techniques to maintain back precautions, but still needs some verbal cues to avoid bending and twisting during functional activities. Continue with current POC.    Follow Up Recommendations  No OT follow up;Supervision/Assistance - 24 hour    Equipment Recommendations  None recommended by OT    Recommendations for Other Services      Precautions / Restrictions Precautions Precautions: Back Precaution Comments: Able to recall 2/3 precautions at beginning of session. Reviewed precautions. Able to verbalize 3/3 precautions at end of session. Required Braces or Orthoses: Spinal Brace Spinal Brace: Lumbar corset;Applied in sitting position Restrictions Weight Bearing Restrictions: No       Mobility Bed Mobility               General bed mobility comments: Pt in recliner on OT arrival  Transfers Overall transfer level: Needs assistance Equipment used: Rolling walker (2 wheeled) Transfers: Sit to/from Stand Sit to Stand: Supervision         General transfer comment: Stood x1 without RW to don pants. Discussed importance of using RW for safety and to ensure maintenance of back precautions. Stood x1 with RW and verbal cues for hand placement.    Balance Overall balance assessment: Needs assistance Sitting-balance support: No upper extremity supported;Feet supported Sitting balance-Leahy Scale: Good     Standing balance support: No upper extremity supported;During functional  activity Standing balance-Leahy Scale: Fair                     ADL Overall ADL's : Needs assistance/impaired     Grooming: Oral care;Supervision/safety;Standing Grooming Details (indicate cue type and reason): Good demo of compensatory technique for back precautions             Lower Body Dressing: Supervision/safety;Cueing for compensatory techniques;Cueing for back precautions;Sit to/from stand Lower Body Dressing Details (indicate cue type and reason): Verbal cues for dressing techniques to follow precautions         Tub/ Shower Transfer: Walk-in shower;Min guard;Adhering to back precautions;Cueing for sequencing;Ambulation;Rolling walker Tub/Shower Transfer Details (indicate cue type and reason): Verbal cues for sequence with RW to follow back precautions and for safety Functional mobility during ADLs: Supervision/safety General ADL Comments: Pt with more flexibility and mobility today, but still demonstrating impulsivity. Able to don pants without AE, but required verbal cues to follow back precautions. Completed simulated shower transfer with RW. Ambulated 250' with RW at supervision level and was able to navigate narrow spaces, stepping over and around obstacles. Pt needed several verbal cues for safe RW use.      Vision                     Perception     Praxis      Cognition   Behavior During Therapy: [facility] for tasks assessed/performed Overall Cognitive Status: Within Functional Limits for tasks assessed       Memory: Decreased recall of precautions               Extremity/Trunk Assessment  Exercises     Shoulder Instructions       General Comments      Pertinent Vitals/ Pain       Pain Assessment: 0-10 Pain Score: 5  Pain Location: low back at surgical site Pain Descriptors / Indicators: Sore Pain Intervention(s): Monitored during session;Repositioned;Premedicated before session  Home Living                                           Prior Functioning/Environment              Frequency Min 2X/week     Progress Toward Goals  OT Goals(current goals can now be found in the care plan section)  Progress towards OT goals: Progressing toward goals  Acute Rehab OT Goals Patient Stated Goal: to go home OT Goal Formulation: With patient Time For Goal Achievement: 04/03/14 Potential to Achieve Goals: Good ADL Goals Pt Will Perform Lower Body Dressing: with modified independence;with adaptive equipment;sit to/from stand Pt Will Transfer to Toilet: with modified independence;ambulating;bedside commode Pt Will Perform Toileting - Clothing Manipulation and hygiene: with modified independence;sit to/from stand Pt Will Perform Tub/Shower Transfer: Shower transfer;with supervision;ambulating;shower seat;rolling walker;grab bars Additional ADL Goal #1: Pt will verbalize 3/3 back precautions with 100% accuracy prior to discharge.  Plan Discharge plan remains appropriate    Co-evaluation                 End of Session Equipment Utilized During Treatment: Gait belt;Rolling walker;Back brace   Activity Tolerance Patient tolerated treatment well   Patient Left in chair;with call bell/phone within reach   Nurse Communication Mobility status;Precautions        Time: 7371-0626 OT Time Calculation (min): 16 min  Charges:    Redmond Baseman 03/21/2014, 8:31 AM

## 2014-03-21 NOTE — Progress Notes (Signed)
Pt discharged at this time with his wife. Neuro intact. Alert, verbal with no noted distress. He denied pain or discomfort. IV discontinued dry dressing applied. Discharge instructions and prescriptions given to pt and his wife with verbal understanding. Pt took all personal belongings home.

## 2014-03-21 NOTE — Progress Notes (Signed)
Received call back from on call Md. Md to come by so see pt in 60min to discuss discharge.

## 2014-06-27 DIAGNOSIS — Z9889 Other specified postprocedural states: Secondary | ICD-10-CM | POA: Insufficient documentation

## 2014-10-04 ENCOUNTER — Encounter: Payer: Self-pay | Admitting: Gastroenterology

## 2014-10-24 DIAGNOSIS — M5412 Radiculopathy, cervical region: Secondary | ICD-10-CM | POA: Insufficient documentation

## 2014-10-30 ENCOUNTER — Other Ambulatory Visit: Payer: Self-pay | Admitting: Neurological Surgery

## 2014-10-30 DIAGNOSIS — M5412 Radiculopathy, cervical region: Secondary | ICD-10-CM

## 2014-10-31 ENCOUNTER — Ambulatory Visit
Admission: RE | Admit: 2014-10-31 | Discharge: 2014-10-31 | Disposition: A | Discharge status: Home / Self Care | Payer: Medicare HMO | Source: Ambulatory Visit | Attending: Neurological Surgery | Admitting: Neurological Surgery

## 2014-10-31 DIAGNOSIS — M5412 Radiculopathy, cervical region: Secondary | ICD-10-CM

## 2014-11-28 ENCOUNTER — Encounter: Payer: Self-pay | Admitting: Gastroenterology

## 2015-01-07 ENCOUNTER — Encounter: Payer: Self-pay | Admitting: Gastroenterology

## 2015-01-21 ENCOUNTER — Ambulatory Visit (AMBULATORY_SURGERY_CENTER): Payer: Self-pay

## 2015-01-21 VITALS — Ht 73.0 in | Wt 190.8 lb

## 2015-01-21 DIAGNOSIS — Z86010 Personal history of colonic polyps: Secondary | ICD-10-CM

## 2015-01-21 NOTE — Progress Notes (Signed)
Per pt, no allergies to soy or egg products.Pt not taking any weight loss meds or using  O2 at home. 

## 2015-02-04 ENCOUNTER — Encounter: Payer: Self-pay | Admitting: Gastroenterology

## 2015-02-04 ENCOUNTER — Ambulatory Visit (AMBULATORY_SURGERY_CENTER): Payer: Medicare HMO | Admitting: Gastroenterology

## 2015-02-04 VITALS — BP 146/79 | HR 63 | Temp 97.3°F | Resp 13 | Ht 73.0 in | Wt 190.0 lb

## 2015-02-04 DIAGNOSIS — Z86010 Personal history of colonic polyps: Secondary | ICD-10-CM | POA: Diagnosis not present

## 2015-02-04 DIAGNOSIS — D123 Benign neoplasm of transverse colon: Secondary | ICD-10-CM

## 2015-02-04 DIAGNOSIS — D129 Benign neoplasm of anus and anal canal: Secondary | ICD-10-CM

## 2015-02-04 DIAGNOSIS — D128 Benign neoplasm of rectum: Secondary | ICD-10-CM

## 2015-02-04 MED ORDER — SODIUM CHLORIDE 0.9 % IV SOLN: 500.0000 mL | INTRAVENOUS | Status: DC

## 2015-02-04 NOTE — Op Note (Signed)
[place]  [address] [place], [id number]   COLONOSCOPY PROCEDURE REPORT  PATIENT: Daniel, Wagner  MR#: [id number] BIRTHDATE: Jul 25, 1940 , 74  yrs. old GENDER: male ENDOSCOPIST: Ladene Artist, MD, [facility] REFERRED BY: Cher Nakai, MD PROCEDURE DATE:  02/04/2015 PROCEDURE:   Colonoscopy, surveillance , Colonoscopy with biopsy, and Colonoscopy with snare polypectomy First Screening Colonoscopy - Avg.  risk and is 50 yrs.  old or older - No.  Prior Negative Screening - Now for repeat screening. N/A  History of Adenoma - Now for follow-up colonoscopy & has been > or = to 3 yrs.  Yes hx of adenoma.  Has been 3 or more years since last colonoscopy.  Polyps removed today? Yes ASA CLASS:   Class II INDICATIONS:Surveillance due to prior colonic neoplasia and PH Colon Adenoma. MEDICATIONS: Monitored anesthesia care and Propofol 300 mg IV DESCRIPTION OF PROCEDURE:   After the risks benefits and alternatives of the procedure were thoroughly explained, informed consent was obtained.  The digital rectal exam revealed no abnormalities of the rectum.   The LB [id number]  endoscope was introduced through the anus and advanced to the cecum, which was identified by both the appendix and ileocecal valve. No adverse events experienced.   The quality of the prep was good.  (MiraLax was used)  The instrument was then slowly withdrawn as the colon was fully examined. Estimated blood loss is zero unless otherwise noted in this procedure report.    COLON FINDINGS: A sessile polyp measuring 4 mm in size was found in the transverse colon.  A polypectomy was performed with cold forceps.  The resection was complete, the polyp tissue was completely retrieved and sent to histology.   A sessile polyp measuring 7 mm in size was found in the rectum.  A polypectomy was performed with a cold snare.  The resection was complete, the polyp tissue was completely retrieved and sent to histology.    The examination was otherwise normal.  Retroflexed views revealed internal Grade I hemorrhoids. The time to cecum = 0.0 Withdrawal time = 11.7   The scope was withdrawn and the procedure completed. COMPLICATIONS: There were no immediate complications.  ENDOSCOPIC IMPRESSION: 1.   Sessile polyp in the transverse colon; polypectomy performed with cold forceps 2.   Sessile polyp in the rectum; polypectomy performed with a cold snare 3.   Grade l internal hemorrhoids  RECOMMENDATIONS: 1.  Await pathology results 2.  Repeat Colonoscopy in 5 years.  eSigned:  Ladene Artist, MD, [facility] 02/04/2015 2:11 PM

## 2015-02-04 NOTE — Patient Instructions (Signed)
YOU HAD AN ENDOSCOPIC PROCEDURE TODAY AT THE [facility]:   Refer to the procedure report that was given to you for any specific questions about what was found during the examination.  If the procedure report does not answer your questions, please call your gastroenterologist to clarify.  If you requested that your care partner not be given the details of your procedure findings, then the procedure report has been included in a sealed envelope for you to review at your convenience later.  YOU SHOULD EXPECT: Some feelings of bloating in the abdomen. Passage of more gas than usual.  Walking can help get rid of the air that was put into your GI tract during the procedure and reduce the bloating. If you had a lower endoscopy (such as a colonoscopy or flexible sigmoidoscopy) you may notice spotting of blood in your stool or on the toilet paper. If you underwent a bowel prep for your procedure, you may not have a normal bowel movement for a few days.  Please Note:  You might notice some irritation and congestion in your nose or some drainage.  This is from the oxygen used during your procedure.  There is no need for concern and it should clear up in a day or so.  SYMPTOMS TO REPORT IMMEDIATELY:   Following lower endoscopy (colonoscopy or flexible sigmoidoscopy):  Excessive amounts of blood in the stool  Significant tenderness or worsening of abdominal pains  Swelling of the abdomen that is new, acute  Fever of 100F or higher   For urgent or emergent issues, a gastroenterologist can be reached at any hour by calling [phone].   DIET: Your first meal following the procedure should be a small meal and then it is ok to progress to your normal diet. Heavy or fried foods are harder to digest and may make you feel nauseous or bloated.  Likewise, meals heavy in dairy and vegetables can increase bloating.  Drink plenty of fluids but you should avoid alcoholic beverages for 24  hours.  ACTIVITY:  You should plan to take it easy for the rest of today and you should NOT DRIVE or use heavy machinery until tomorrow (because of the sedation medicines used during the test).    FOLLOW UP: Our staff will call the number listed on your records the next business day following your procedure to check on you and address any questions or concerns that you may have regarding the information given to you following your procedure. If we do not reach you, we will leave a message.  However, if you are feeling well and you are not experiencing any problems, there is no need to return our call.  We will assume that you have returned to your regular daily activities without incident.  If any biopsies were taken you will be contacted by phone or by letter within the next 1-3 weeks.  Please call us at [phone] if you have not heard about the biopsies in 3 weeks.    SIGNATURES/CONFIDENTIALITY: You and/or your care partner have signed paperwork which will be entered into your electronic medical record.  These signatures attest to the fact that that the information above on your After Visit Summary has been reviewed and is understood.  Full responsibility of the confidentiality of this discharge information lies with you and/or your care-partner. 

## 2015-02-04 NOTE — Progress Notes (Signed)
Called to room to assist during endoscopic procedure.  Patient ID and intended procedure confirmed with present staff. Received instructions for my participation in the procedure from the performing physician.  

## 2015-02-04 NOTE — Progress Notes (Signed)
Patient awakening,vss,report to rn 

## 2015-02-05 ENCOUNTER — Telehealth: Payer: Self-pay | Admitting: *Deleted

## 2015-02-05 NOTE — Telephone Encounter (Signed)
  Follow up Call-  Call back number 02/04/2015  Post procedure Call Back phone  # [phone]  Permission to leave phone message Yes     Patient questions:  Do you have a fever, pain , or abdominal swelling? No. Pain Score  0 *  Have you tolerated food without any problems? Yes.    Have you been able to return to your normal activities? Yes.    Do you have any questions about your discharge instructions: Diet   No. Medications  No. Follow up visit  No.  Do you have questions or concerns about your Care? No.  Actions: * If pain score is 4 or above: No action needed, pain <4.

## 2015-02-10 ENCOUNTER — Encounter: Payer: Self-pay | Admitting: Gastroenterology

## 2015-04-16 DIAGNOSIS — J0111 Acute recurrent frontal sinusitis: Secondary | ICD-10-CM | POA: Diagnosis not present

## 2015-04-16 DIAGNOSIS — R51 Headache: Secondary | ICD-10-CM | POA: Diagnosis not present

## 2015-04-16 DIAGNOSIS — J342 Deviated nasal septum: Secondary | ICD-10-CM | POA: Diagnosis not present

## 2015-04-16 DIAGNOSIS — J3489 Other specified disorders of nose and nasal sinuses: Secondary | ICD-10-CM | POA: Diagnosis not present

## 2015-05-15 DIAGNOSIS — L821 Other seborrheic keratosis: Secondary | ICD-10-CM | POA: Diagnosis not present

## 2015-05-15 DIAGNOSIS — L570 Actinic keratosis: Secondary | ICD-10-CM | POA: Diagnosis not present

## 2015-06-03 DIAGNOSIS — E7800 Pure hypercholesterolemia, unspecified: Secondary | ICD-10-CM | POA: Diagnosis not present

## 2015-06-03 DIAGNOSIS — F329 Major depressive disorder, single episode, unspecified: Secondary | ICD-10-CM | POA: Diagnosis not present

## 2015-06-03 DIAGNOSIS — N400 Enlarged prostate without lower urinary tract symptoms: Secondary | ICD-10-CM | POA: Diagnosis not present

## 2015-06-03 DIAGNOSIS — J209 Acute bronchitis, unspecified: Secondary | ICD-10-CM | POA: Diagnosis not present

## 2015-06-03 DIAGNOSIS — K589 Irritable bowel syndrome without diarrhea: Secondary | ICD-10-CM | POA: Diagnosis not present

## 2015-06-03 DIAGNOSIS — M545 Low back pain: Secondary | ICD-10-CM | POA: Diagnosis not present

## 2015-06-03 DIAGNOSIS — R3129 Other microscopic hematuria: Secondary | ICD-10-CM | POA: Diagnosis not present

## 2015-06-03 DIAGNOSIS — J302 Other seasonal allergic rhinitis: Secondary | ICD-10-CM | POA: Diagnosis not present

## 2015-06-03 DIAGNOSIS — N529 Male erectile dysfunction, unspecified: Secondary | ICD-10-CM | POA: Diagnosis not present

## 2015-06-03 DIAGNOSIS — K219 Gastro-esophageal reflux disease without esophagitis: Secondary | ICD-10-CM | POA: Diagnosis not present

## 2015-06-03 DIAGNOSIS — I10 Essential (primary) hypertension: Secondary | ICD-10-CM | POA: Diagnosis not present

## 2015-06-03 DIAGNOSIS — E291 Testicular hypofunction: Secondary | ICD-10-CM | POA: Diagnosis not present

## 2015-07-23 DIAGNOSIS — M4806 Spinal stenosis, lumbar region: Secondary | ICD-10-CM | POA: Diagnosis not present

## 2015-07-24 DIAGNOSIS — N400 Enlarged prostate without lower urinary tract symptoms: Secondary | ICD-10-CM | POA: Diagnosis not present

## 2015-07-24 DIAGNOSIS — R3129 Other microscopic hematuria: Secondary | ICD-10-CM | POA: Diagnosis not present

## 2015-07-24 DIAGNOSIS — I10 Essential (primary) hypertension: Secondary | ICD-10-CM | POA: Diagnosis not present

## 2015-07-24 DIAGNOSIS — F329 Major depressive disorder, single episode, unspecified: Secondary | ICD-10-CM | POA: Diagnosis not present

## 2015-07-24 DIAGNOSIS — F419 Anxiety disorder, unspecified: Secondary | ICD-10-CM | POA: Diagnosis not present

## 2015-07-24 DIAGNOSIS — K589 Irritable bowel syndrome without diarrhea: Secondary | ICD-10-CM | POA: Diagnosis not present

## 2015-07-24 DIAGNOSIS — J302 Other seasonal allergic rhinitis: Secondary | ICD-10-CM | POA: Diagnosis not present

## 2015-07-24 DIAGNOSIS — E119 Type 2 diabetes mellitus without complications: Secondary | ICD-10-CM | POA: Diagnosis not present

## 2015-07-24 DIAGNOSIS — E7800 Pure hypercholesterolemia, unspecified: Secondary | ICD-10-CM | POA: Diagnosis not present

## 2015-07-24 DIAGNOSIS — N529 Male erectile dysfunction, unspecified: Secondary | ICD-10-CM | POA: Diagnosis not present

## 2015-07-24 DIAGNOSIS — E291 Testicular hypofunction: Secondary | ICD-10-CM | POA: Diagnosis not present

## 2015-07-24 DIAGNOSIS — K219 Gastro-esophageal reflux disease without esophagitis: Secondary | ICD-10-CM | POA: Diagnosis not present

## 2015-08-14 DIAGNOSIS — M25562 Pain in left knee: Secondary | ICD-10-CM | POA: Diagnosis not present

## 2015-08-14 DIAGNOSIS — M25462 Effusion, left knee: Secondary | ICD-10-CM | POA: Diagnosis not present

## 2015-08-15 DIAGNOSIS — M5416 Radiculopathy, lumbar region: Secondary | ICD-10-CM | POA: Diagnosis not present

## 2015-08-15 DIAGNOSIS — Z981 Arthrodesis status: Secondary | ICD-10-CM | POA: Diagnosis not present

## 2015-08-15 DIAGNOSIS — M4726 Other spondylosis with radiculopathy, lumbar region: Secondary | ICD-10-CM | POA: Diagnosis not present

## 2015-08-20 DIAGNOSIS — E291 Testicular hypofunction: Secondary | ICD-10-CM | POA: Diagnosis not present

## 2015-08-20 DIAGNOSIS — M545 Low back pain: Secondary | ICD-10-CM | POA: Diagnosis not present

## 2015-08-20 DIAGNOSIS — M25561 Pain in right knee: Secondary | ICD-10-CM | POA: Diagnosis not present

## 2015-08-20 DIAGNOSIS — N529 Male erectile dysfunction, unspecified: Secondary | ICD-10-CM | POA: Diagnosis not present

## 2015-08-20 DIAGNOSIS — E119 Type 2 diabetes mellitus without complications: Secondary | ICD-10-CM | POA: Diagnosis not present

## 2015-08-20 DIAGNOSIS — Z1389 Encounter for screening for other disorder: Secondary | ICD-10-CM | POA: Diagnosis not present

## 2015-08-20 DIAGNOSIS — I10 Essential (primary) hypertension: Secondary | ICD-10-CM | POA: Diagnosis not present

## 2015-08-20 DIAGNOSIS — K219 Gastro-esophageal reflux disease without esophagitis: Secondary | ICD-10-CM | POA: Diagnosis not present

## 2015-08-20 DIAGNOSIS — N400 Enlarged prostate without lower urinary tract symptoms: Secondary | ICD-10-CM | POA: Diagnosis not present

## 2015-08-20 DIAGNOSIS — F329 Major depressive disorder, single episode, unspecified: Secondary | ICD-10-CM | POA: Diagnosis not present

## 2015-08-20 DIAGNOSIS — E7800 Pure hypercholesterolemia, unspecified: Secondary | ICD-10-CM | POA: Diagnosis not present

## 2015-09-25 DIAGNOSIS — K589 Irritable bowel syndrome without diarrhea: Secondary | ICD-10-CM | POA: Diagnosis not present

## 2015-09-25 DIAGNOSIS — K219 Gastro-esophageal reflux disease without esophagitis: Secondary | ICD-10-CM | POA: Diagnosis not present

## 2015-09-25 DIAGNOSIS — E7800 Pure hypercholesterolemia, unspecified: Secondary | ICD-10-CM | POA: Diagnosis not present

## 2015-09-25 DIAGNOSIS — N529 Male erectile dysfunction, unspecified: Secondary | ICD-10-CM | POA: Diagnosis not present

## 2015-09-25 DIAGNOSIS — R3129 Other microscopic hematuria: Secondary | ICD-10-CM | POA: Diagnosis not present

## 2015-09-25 DIAGNOSIS — E119 Type 2 diabetes mellitus without complications: Secondary | ICD-10-CM | POA: Diagnosis not present

## 2015-09-25 DIAGNOSIS — F419 Anxiety disorder, unspecified: Secondary | ICD-10-CM | POA: Diagnosis not present

## 2015-09-25 DIAGNOSIS — J302 Other seasonal allergic rhinitis: Secondary | ICD-10-CM | POA: Diagnosis not present

## 2015-09-25 DIAGNOSIS — E291 Testicular hypofunction: Secondary | ICD-10-CM | POA: Diagnosis not present

## 2015-09-25 DIAGNOSIS — F329 Major depressive disorder, single episode, unspecified: Secondary | ICD-10-CM | POA: Diagnosis not present

## 2015-09-25 DIAGNOSIS — N400 Enlarged prostate without lower urinary tract symptoms: Secondary | ICD-10-CM | POA: Diagnosis not present

## 2015-09-25 DIAGNOSIS — I10 Essential (primary) hypertension: Secondary | ICD-10-CM | POA: Diagnosis not present

## 2015-10-13 DIAGNOSIS — M1612 Unilateral primary osteoarthritis, left hip: Secondary | ICD-10-CM | POA: Diagnosis not present

## 2015-10-21 DIAGNOSIS — M1612 Unilateral primary osteoarthritis, left hip: Secondary | ICD-10-CM | POA: Diagnosis not present

## 2015-10-23 DIAGNOSIS — K219 Gastro-esophageal reflux disease without esophagitis: Secondary | ICD-10-CM | POA: Diagnosis not present

## 2015-10-23 DIAGNOSIS — R3129 Other microscopic hematuria: Secondary | ICD-10-CM | POA: Diagnosis not present

## 2015-10-23 DIAGNOSIS — I10 Essential (primary) hypertension: Secondary | ICD-10-CM | POA: Diagnosis not present

## 2015-10-23 DIAGNOSIS — E119 Type 2 diabetes mellitus without complications: Secondary | ICD-10-CM | POA: Diagnosis not present

## 2015-10-23 DIAGNOSIS — Z79899 Other long term (current) drug therapy: Secondary | ICD-10-CM | POA: Diagnosis not present

## 2015-10-23 DIAGNOSIS — F329 Major depressive disorder, single episode, unspecified: Secondary | ICD-10-CM | POA: Diagnosis not present

## 2015-10-23 DIAGNOSIS — E7800 Pure hypercholesterolemia, unspecified: Secondary | ICD-10-CM | POA: Diagnosis not present

## 2015-10-23 DIAGNOSIS — J209 Acute bronchitis, unspecified: Secondary | ICD-10-CM | POA: Diagnosis not present

## 2015-10-23 DIAGNOSIS — K589 Irritable bowel syndrome without diarrhea: Secondary | ICD-10-CM | POA: Diagnosis not present

## 2015-10-23 DIAGNOSIS — N529 Male erectile dysfunction, unspecified: Secondary | ICD-10-CM | POA: Diagnosis not present

## 2015-10-23 DIAGNOSIS — E291 Testicular hypofunction: Secondary | ICD-10-CM | POA: Diagnosis not present

## 2015-10-23 DIAGNOSIS — N400 Enlarged prostate without lower urinary tract symptoms: Secondary | ICD-10-CM | POA: Diagnosis not present

## 2015-11-04 DIAGNOSIS — M84374A Stress fracture, right foot, initial encounter for fracture: Secondary | ICD-10-CM | POA: Diagnosis not present

## 2015-11-04 DIAGNOSIS — S92301A Fracture of unspecified metatarsal bone(s), right foot, initial encounter for closed fracture: Secondary | ICD-10-CM | POA: Diagnosis not present

## 2015-11-05 DIAGNOSIS — F419 Anxiety disorder, unspecified: Secondary | ICD-10-CM | POA: Diagnosis not present

## 2015-11-05 DIAGNOSIS — N529 Male erectile dysfunction, unspecified: Secondary | ICD-10-CM | POA: Diagnosis not present

## 2015-11-05 DIAGNOSIS — K219 Gastro-esophageal reflux disease without esophagitis: Secondary | ICD-10-CM | POA: Diagnosis not present

## 2015-11-05 DIAGNOSIS — K589 Irritable bowel syndrome without diarrhea: Secondary | ICD-10-CM | POA: Diagnosis not present

## 2015-11-05 DIAGNOSIS — F319 Bipolar disorder, unspecified: Secondary | ICD-10-CM | POA: Diagnosis not present

## 2015-11-05 DIAGNOSIS — E7800 Pure hypercholesterolemia, unspecified: Secondary | ICD-10-CM | POA: Diagnosis not present

## 2015-11-05 DIAGNOSIS — I10 Essential (primary) hypertension: Secondary | ICD-10-CM | POA: Diagnosis not present

## 2015-11-05 DIAGNOSIS — M25559 Pain in unspecified hip: Secondary | ICD-10-CM | POA: Diagnosis not present

## 2015-11-05 DIAGNOSIS — R3129 Other microscopic hematuria: Secondary | ICD-10-CM | POA: Diagnosis not present

## 2015-11-05 DIAGNOSIS — E119 Type 2 diabetes mellitus without complications: Secondary | ICD-10-CM | POA: Diagnosis not present

## 2015-11-05 DIAGNOSIS — E291 Testicular hypofunction: Secondary | ICD-10-CM | POA: Diagnosis not present

## 2015-11-05 DIAGNOSIS — N400 Enlarged prostate without lower urinary tract symptoms: Secondary | ICD-10-CM | POA: Diagnosis not present

## 2015-11-18 DIAGNOSIS — S92301D Fracture of unspecified metatarsal bone(s), right foot, subsequent encounter for fracture with routine healing: Secondary | ICD-10-CM | POA: Diagnosis not present

## 2015-12-04 DIAGNOSIS — K589 Irritable bowel syndrome without diarrhea: Secondary | ICD-10-CM | POA: Diagnosis not present

## 2015-12-04 DIAGNOSIS — N259 Disorder resulting from impaired renal tubular function, unspecified: Secondary | ICD-10-CM | POA: Diagnosis not present

## 2015-12-04 DIAGNOSIS — F329 Major depressive disorder, single episode, unspecified: Secondary | ICD-10-CM | POA: Diagnosis not present

## 2015-12-04 DIAGNOSIS — J302 Other seasonal allergic rhinitis: Secondary | ICD-10-CM | POA: Diagnosis not present

## 2015-12-04 DIAGNOSIS — N400 Enlarged prostate without lower urinary tract symptoms: Secondary | ICD-10-CM | POA: Diagnosis not present

## 2015-12-04 DIAGNOSIS — E7800 Pure hypercholesterolemia, unspecified: Secondary | ICD-10-CM | POA: Diagnosis not present

## 2015-12-04 DIAGNOSIS — R3129 Other microscopic hematuria: Secondary | ICD-10-CM | POA: Diagnosis not present

## 2015-12-04 DIAGNOSIS — F419 Anxiety disorder, unspecified: Secondary | ICD-10-CM | POA: Diagnosis not present

## 2015-12-04 DIAGNOSIS — K219 Gastro-esophageal reflux disease without esophagitis: Secondary | ICD-10-CM | POA: Diagnosis not present

## 2015-12-04 DIAGNOSIS — E291 Testicular hypofunction: Secondary | ICD-10-CM | POA: Diagnosis not present

## 2015-12-04 DIAGNOSIS — E119 Type 2 diabetes mellitus without complications: Secondary | ICD-10-CM | POA: Diagnosis not present

## 2015-12-04 DIAGNOSIS — I10 Essential (primary) hypertension: Secondary | ICD-10-CM | POA: Diagnosis not present

## 2015-12-18 DIAGNOSIS — S92301D Fracture of unspecified metatarsal bone(s), right foot, subsequent encounter for fracture with routine healing: Secondary | ICD-10-CM | POA: Diagnosis not present

## 2015-12-24 DIAGNOSIS — M4806 Spinal stenosis, lumbar region: Secondary | ICD-10-CM | POA: Diagnosis not present

## 2015-12-24 DIAGNOSIS — M791 Myalgia, unspecified site: Secondary | ICD-10-CM | POA: Insufficient documentation

## 2015-12-24 DIAGNOSIS — I10 Essential (primary) hypertension: Secondary | ICD-10-CM | POA: Diagnosis not present

## 2016-01-01 DIAGNOSIS — K589 Irritable bowel syndrome without diarrhea: Secondary | ICD-10-CM | POA: Diagnosis not present

## 2016-01-01 DIAGNOSIS — I10 Essential (primary) hypertension: Secondary | ICD-10-CM | POA: Diagnosis not present

## 2016-01-01 DIAGNOSIS — E119 Type 2 diabetes mellitus without complications: Secondary | ICD-10-CM | POA: Diagnosis not present

## 2016-01-01 DIAGNOSIS — Z9181 History of falling: Secondary | ICD-10-CM | POA: Diagnosis not present

## 2016-01-01 DIAGNOSIS — R3129 Other microscopic hematuria: Secondary | ICD-10-CM | POA: Diagnosis not present

## 2016-01-01 DIAGNOSIS — N529 Male erectile dysfunction, unspecified: Secondary | ICD-10-CM | POA: Diagnosis not present

## 2016-01-01 DIAGNOSIS — M545 Low back pain: Secondary | ICD-10-CM | POA: Diagnosis not present

## 2016-01-01 DIAGNOSIS — N400 Enlarged prostate without lower urinary tract symptoms: Secondary | ICD-10-CM | POA: Diagnosis not present

## 2016-01-01 DIAGNOSIS — E7800 Pure hypercholesterolemia, unspecified: Secondary | ICD-10-CM | POA: Diagnosis not present

## 2016-01-01 DIAGNOSIS — F329 Major depressive disorder, single episode, unspecified: Secondary | ICD-10-CM | POA: Diagnosis not present

## 2016-01-01 DIAGNOSIS — K219 Gastro-esophageal reflux disease without esophagitis: Secondary | ICD-10-CM | POA: Diagnosis not present

## 2016-01-01 DIAGNOSIS — E291 Testicular hypofunction: Secondary | ICD-10-CM | POA: Diagnosis not present

## 2016-01-22 DIAGNOSIS — E119 Type 2 diabetes mellitus without complications: Secondary | ICD-10-CM | POA: Diagnosis not present

## 2016-01-22 DIAGNOSIS — K219 Gastro-esophageal reflux disease without esophagitis: Secondary | ICD-10-CM | POA: Diagnosis not present

## 2016-01-22 DIAGNOSIS — R3129 Other microscopic hematuria: Secondary | ICD-10-CM | POA: Diagnosis not present

## 2016-01-22 DIAGNOSIS — I10 Essential (primary) hypertension: Secondary | ICD-10-CM | POA: Diagnosis not present

## 2016-01-22 DIAGNOSIS — N529 Male erectile dysfunction, unspecified: Secondary | ICD-10-CM | POA: Diagnosis not present

## 2016-01-22 DIAGNOSIS — F419 Anxiety disorder, unspecified: Secondary | ICD-10-CM | POA: Diagnosis not present

## 2016-01-22 DIAGNOSIS — E7800 Pure hypercholesterolemia, unspecified: Secondary | ICD-10-CM | POA: Diagnosis not present

## 2016-01-22 DIAGNOSIS — J0100 Acute maxillary sinusitis, unspecified: Secondary | ICD-10-CM | POA: Diagnosis not present

## 2016-01-22 DIAGNOSIS — K589 Irritable bowel syndrome without diarrhea: Secondary | ICD-10-CM | POA: Diagnosis not present

## 2016-01-22 DIAGNOSIS — N400 Enlarged prostate without lower urinary tract symptoms: Secondary | ICD-10-CM | POA: Diagnosis not present

## 2016-01-22 DIAGNOSIS — M545 Low back pain: Secondary | ICD-10-CM | POA: Diagnosis not present

## 2016-01-22 DIAGNOSIS — E349 Endocrine disorder, unspecified: Secondary | ICD-10-CM | POA: Diagnosis not present

## 2016-02-10 DIAGNOSIS — E7800 Pure hypercholesterolemia, unspecified: Secondary | ICD-10-CM | POA: Diagnosis not present

## 2016-02-10 DIAGNOSIS — Z79891 Long term (current) use of opiate analgesic: Secondary | ICD-10-CM | POA: Diagnosis not present

## 2016-02-10 DIAGNOSIS — M545 Low back pain: Secondary | ICD-10-CM | POA: Diagnosis not present

## 2016-02-10 DIAGNOSIS — K219 Gastro-esophageal reflux disease without esophagitis: Secondary | ICD-10-CM | POA: Diagnosis not present

## 2016-02-10 DIAGNOSIS — N400 Enlarged prostate without lower urinary tract symptoms: Secondary | ICD-10-CM | POA: Diagnosis not present

## 2016-02-10 DIAGNOSIS — I10 Essential (primary) hypertension: Secondary | ICD-10-CM | POA: Diagnosis not present

## 2016-02-10 DIAGNOSIS — N529 Male erectile dysfunction, unspecified: Secondary | ICD-10-CM | POA: Diagnosis not present

## 2016-02-10 DIAGNOSIS — F419 Anxiety disorder, unspecified: Secondary | ICD-10-CM | POA: Diagnosis not present

## 2016-02-10 DIAGNOSIS — K589 Irritable bowel syndrome without diarrhea: Secondary | ICD-10-CM | POA: Diagnosis not present

## 2016-02-10 DIAGNOSIS — R3129 Other microscopic hematuria: Secondary | ICD-10-CM | POA: Diagnosis not present

## 2016-02-10 DIAGNOSIS — E119 Type 2 diabetes mellitus without complications: Secondary | ICD-10-CM | POA: Diagnosis not present

## 2016-02-10 DIAGNOSIS — E349 Endocrine disorder, unspecified: Secondary | ICD-10-CM | POA: Diagnosis not present

## 2016-03-01 ENCOUNTER — Encounter: Payer: Self-pay | Admitting: Gastroenterology

## 2016-03-01 ENCOUNTER — Ambulatory Visit (INDEPENDENT_AMBULATORY_CARE_PROVIDER_SITE_OTHER): Payer: PPO | Admitting: Gastroenterology

## 2016-03-01 VITALS — BP 140/80 | HR 84 | Ht 74.0 in | Wt 189.0 lb

## 2016-03-01 DIAGNOSIS — R1013 Epigastric pain: Secondary | ICD-10-CM | POA: Diagnosis not present

## 2016-03-01 DIAGNOSIS — R140 Abdominal distension (gaseous): Secondary | ICD-10-CM | POA: Diagnosis not present

## 2016-03-01 DIAGNOSIS — R141 Gas pain: Secondary | ICD-10-CM | POA: Diagnosis not present

## 2016-03-01 DIAGNOSIS — K219 Gastro-esophageal reflux disease without esophagitis: Secondary | ICD-10-CM

## 2016-03-01 NOTE — Patient Instructions (Addendum)
Discontinue Meloxicam  Increase acid medication to twice a day  Start Daily probiotic  Florastor, Culturelle or align  Use Ibgard for gas  Samples given

## 2016-03-01 NOTE — Progress Notes (Signed)
03/01/2016 Daniel Wagner [id number] 09/18/1940   HISTORY OF PRESENT ILLNESS:  This is a 75 year old male known to Dr. Fuller Plan. 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  Had and EGD in 11/2004 at which time he had some gastritis.  He presents to our office today with several nonspecific complaints. He reports what he describes as loose/soft stools but says that he only has 1-2 bowel movements each day, usually in the morning and then no further bowel movements throughout the day. He also reports some upper abdominal soreness/discomfort as well as nausea, reflux, and belching.  He tells me that these upper symptoms seem to begin about 6 weeks ago after taking an antibiotic for some respiratory illness.  Does feel acid coming up at times.  He tells me that his appetite is good and he has not had any weight loss. He reports taking a 20 mg over-the-counter acid medication, but does not recall the name and it is not on his medication list. He does take meloxicam 7.5 mg twice daily for quite some time and then occasionally takes in ibuprofen or Advil as well. He tells me that he previously was taking a probiotic but has not been doing that recently. Has been using Gas-X, which he thinks helps to some degree.   Past Medical History:  Diagnosis Date  . Arthritis   . 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 GERD (gastroesophageal reflux disease) Hx in past.  . History of kidney stones 12/14   Lithotrispy  . Hypertension    Past Surgical History:  Procedure Laterality Date  . ANTERIOR CERVICAL DECOMPRESSION/DISCECTOMY FUSION 4 LEVELS N/A 06/30/2012   Procedure: ANTERIOR CERVICAL DECOMPRESSION/DISCECTOMY FUSION 4 LEVELS;  Surgeon: Kristeen Miss, MD;  Location: [facility];  Service: Neurosurgery;  Laterality: N/A;  C3-4 C4-5 C5-6  C6-7 Anterior cervical decompression/diskectomy/fusion  . BACK SURGERY  08/2010   lumbar fusion  . CHOLECYSTECTOMY  1990's  . NECK SURGERY     x2  . sinus surgeries     x2  . SKIN CANCER EXCISION     arm chest and head basal cell  . TRANSURETHRAL RESECTION OF PROSTATE  1999    reports that he has never smoked. He has never used smokeless tobacco. He reports that he drinks about 4.2 oz of alcohol per week . He reports that he does not use drugs. family history includes Heart disease in his brother, mother, and sister; Tongue cancer in his brother. Allergies  Allergen Reactions  . Aspirin Other (See Comments)    Jittery if taking large quantities      Outpatient Encounter Prescriptions as of 03/01/2016  Medication Sig  . dicyclomine (BENTYL) 10 MG capsule Take 10 mg by mouth 3 (three) times daily before meals.  . doxazosin (CARDURA) 4 MG tablet Take 4 mg by mouth daily.   . fluticasone (FLONASE) 50 MCG/ACT nasal spray Place 2 sprays into the nose daily as needed (for stuffiness).  Marland Kitchen glucosamine-chondroitin 500-400 MG tablet Take 1 tablet by mouth 3 (three) times daily.  Marland Kitchen guaiFENesin (MUCINEX) 600 MG 12 hr tablet Take 600 mg by mouth daily.  Marland Kitchen HYDROcodone-acetaminophen (NORCO/VICODIN) 5-325 MG per tablet Take 1-2 tablets by mouth 2 (two) times daily as needed for pain.  Marland Kitchen lisinopril (  PRINIVIL,ZESTRIL) 20 MG tablet Take 20 mg by mouth every evening.   . lovastatin (MEVACOR) 40 MG tablet Take 40 mg by mouth at bedtime.  . meloxicam (MOBIC) 7.5 MG tablet Take 7.5 mg by mouth 2 (two) times daily.  . Multiple Vitamin (MULTIVITAMIN) tablet Take 1 tablet by mouth. Ultra vitamin-Take 2 capsules daily  . Omega-3 Fatty Acids (SALMON OIL PO) Take 105 mg by mouth. Take 2 tablets daily  . Polyethyl Glycol-Propyl Glycol (SYSTANE ULTRA) 0.4-0.3 % SOLN Apply 1 drop to eye 2 (two) times daily.  . Probiotic Product (PRO-BIOTIC BLEND PO) Take 1 capsule by mouth daily.  . Simethicone (GAS-X PO) Take 1  capsule by mouth 3 (three) times daily. Uses [employer] brand  . Testosterone (ANDROGEL) 40.5 MG/2.5GM (1.62%) GEL Place 1 Squirt onto the skin every evening.   . traMADol (ULTRAM) 50 MG tablet Take by mouth every 6 (six) hours as needed.  . [DISCONTINUED] ALPRAZolam (XANAX) 0.25 MG tablet Take 0.25 mg by mouth daily as needed for anxiety.  . [DISCONTINUED] diazepam (VALIUM) 5 MG tablet Take 1 tablet (5 mg total) by mouth every 6 (six) hours as needed (Muscle spasm). (Patient not taking: Reported on 03/15/2014)  . [DISCONTINUED] diazepam (VALIUM) 5 MG tablet Take 1 tablet (5 mg total) by mouth every 6 (six) hours as needed for anxiety. (Patient not taking: Reported on 01/21/2015)   No facility-administered encounter medications on file as of 03/01/2016.      REVIEW OF SYSTEMS  : All other systems reviewed and negative except where noted in the History of Present Illness.   PHYSICAL EXAM: BP 140/80   Pulse 84   Ht 6\' 2"  (1.88 m)   Wt 189 lb (85.7 kg)   BMI 24.27 kg/m  General: Well developed white male in no acute distress Head: Normocephalic and atraumatic Eyes:  Sclerae anicteric, conjunctiva pink. Ears: Normal auditory acuity Lungs: Clear throughout to auscultation Heart: Regular rate and rhythm Abdomen: Soft, non-distended.  Normal bowel sounds.  Non-tender. Musculoskeletal: Symmetrical with no gross deformities  Skin: No lesions on visible extremities Extremities: No edema  Neurological: Alert oriented x 4, grossly non-focal Psychological:  Alert and cooperative. Normal mood and affect  ASSESSMENT AND PLAN: -75 year old male with what appears to be somewhat mild symptoms of intermittent upper abdominal discomfort with associated reflux and belching. Possibly all GERD related. He has also been on NSAID medications. I have asked him to try to discontinue his meloxicam, which he thinks that he can do. I also asked him to increase his over-the-counter acid medication to twice daily.   Will restart a daily probiotic and can try IBgard for gas (samples and coupon given).  *Will follow-up in about 6-8 weeks.  If symptoms persistent at all then may need EGD.   CC:  Cher Nakai, MD

## 2016-03-02 NOTE — Progress Notes (Signed)
Reviewed and agree with initial management plan.  Sheyann Sulton T. Dyonna Jaspers, MD FACG 

## 2016-03-11 DIAGNOSIS — R3129 Other microscopic hematuria: Secondary | ICD-10-CM | POA: Diagnosis not present

## 2016-03-11 DIAGNOSIS — E349 Endocrine disorder, unspecified: Secondary | ICD-10-CM | POA: Diagnosis not present

## 2016-03-11 DIAGNOSIS — K589 Irritable bowel syndrome without diarrhea: Secondary | ICD-10-CM | POA: Diagnosis not present

## 2016-03-11 DIAGNOSIS — F329 Major depressive disorder, single episode, unspecified: Secondary | ICD-10-CM | POA: Diagnosis not present

## 2016-03-11 DIAGNOSIS — N529 Male erectile dysfunction, unspecified: Secondary | ICD-10-CM | POA: Diagnosis not present

## 2016-03-11 DIAGNOSIS — F419 Anxiety disorder, unspecified: Secondary | ICD-10-CM | POA: Diagnosis not present

## 2016-03-11 DIAGNOSIS — N400 Enlarged prostate without lower urinary tract symptoms: Secondary | ICD-10-CM | POA: Diagnosis not present

## 2016-03-11 DIAGNOSIS — M545 Low back pain: Secondary | ICD-10-CM | POA: Diagnosis not present

## 2016-03-11 DIAGNOSIS — K219 Gastro-esophageal reflux disease without esophagitis: Secondary | ICD-10-CM | POA: Diagnosis not present

## 2016-03-11 DIAGNOSIS — E7800 Pure hypercholesterolemia, unspecified: Secondary | ICD-10-CM | POA: Diagnosis not present

## 2016-03-11 DIAGNOSIS — I10 Essential (primary) hypertension: Secondary | ICD-10-CM | POA: Diagnosis not present

## 2016-03-11 DIAGNOSIS — E119 Type 2 diabetes mellitus without complications: Secondary | ICD-10-CM | POA: Diagnosis not present

## 2016-03-30 DIAGNOSIS — H25813 Combined forms of age-related cataract, bilateral: Secondary | ICD-10-CM | POA: Diagnosis not present

## 2016-03-30 DIAGNOSIS — E119 Type 2 diabetes mellitus without complications: Secondary | ICD-10-CM | POA: Diagnosis not present

## 2016-03-30 DIAGNOSIS — H18413 Arcus senilis, bilateral: Secondary | ICD-10-CM | POA: Diagnosis not present

## 2016-04-01 DIAGNOSIS — S92301K Fracture of unspecified metatarsal bone(s), right foot, subsequent encounter for fracture with nonunion: Secondary | ICD-10-CM | POA: Diagnosis not present

## 2016-04-04 DIAGNOSIS — S92301K Fracture of unspecified metatarsal bone(s), right foot, subsequent encounter for fracture with nonunion: Secondary | ICD-10-CM | POA: Insufficient documentation

## 2016-04-08 DIAGNOSIS — M48062 Spinal stenosis, lumbar region with neurogenic claudication: Secondary | ICD-10-CM | POA: Diagnosis not present

## 2016-04-21 ENCOUNTER — Ambulatory Visit: Payer: PPO | Admitting: Gastroenterology

## 2016-04-23 ENCOUNTER — Other Ambulatory Visit: Payer: Self-pay | Admitting: Neurological Surgery

## 2016-04-23 DIAGNOSIS — M48062 Spinal stenosis, lumbar region with neurogenic claudication: Secondary | ICD-10-CM

## 2016-04-27 DIAGNOSIS — S92301K Fracture of unspecified metatarsal bone(s), right foot, subsequent encounter for fracture with nonunion: Secondary | ICD-10-CM | POA: Diagnosis not present

## 2016-04-30 ENCOUNTER — Ambulatory Visit (INDEPENDENT_AMBULATORY_CARE_PROVIDER_SITE_OTHER): Payer: PPO | Admitting: Gastroenterology

## 2016-04-30 ENCOUNTER — Encounter: Payer: Self-pay | Admitting: Gastroenterology

## 2016-04-30 VITALS — BP 120/80 | HR 76 | Ht 71.5 in | Wt 190.2 lb

## 2016-04-30 DIAGNOSIS — K219 Gastro-esophageal reflux disease without esophagitis: Secondary | ICD-10-CM | POA: Diagnosis not present

## 2016-04-30 DIAGNOSIS — R143 Flatulence: Secondary | ICD-10-CM

## 2016-04-30 DIAGNOSIS — R1084 Generalized abdominal pain: Secondary | ICD-10-CM

## 2016-04-30 NOTE — Progress Notes (Signed)
History of Present Illness: This is a 76 year old male with flatulence and intermittent generalized abdominal pain described as cramping and burning. He also GERD symptoms. States his symptoms improved when he began taking AcipHex once daily and decreased Mobic to once daily. He tried avoiding Mobic for a couple weeks however his arthritis symptoms were poorly controlled and he did not note a benefit to his GI symptoms. He has been taking dicyclomine once in the morning and not 3 times daily as prescribed.  Colonoscopy 01/2015 showed small adenomatous polyps and internal hemorrhoids   Allergies  Allergen Reactions  . Aspirin Other (See Comments)    Jittery if taking large quantities   Outpatient Medications Prior to Visit  Medication Sig Dispense Refill  . dicyclomine (BENTYL) 10 MG capsule Take 10 mg by mouth 3 (three) times daily before meals.    . doxazosin (CARDURA) 4 MG tablet Take 4 mg by mouth daily.     . fluticasone (FLONASE) 50 MCG/ACT nasal spray Place 2 sprays into the nose daily as needed (for stuffiness).    Marland Kitchen glucosamine-chondroitin 500-400 MG tablet Take 1 tablet by mouth 3 (three) times daily.    Marland Kitchen guaiFENesin (MUCINEX) 600 MG 12 hr tablet Take 600 mg by mouth daily.    Marland Kitchen HYDROcodone-acetaminophen (NORCO/VICODIN) 5-325 MG per tablet Take 1-2 tablets by mouth 2 (two) times daily as needed for pain. 40 tablet 0  . lisinopril (PRINIVIL,ZESTRIL) 20 MG tablet Take 20 mg by mouth every evening.     . lovastatin (MEVACOR) 40 MG tablet Take 40 mg by mouth at bedtime.    . meloxicam (MOBIC) 7.5 MG tablet Take 7.5 mg by mouth 2 (two) times daily.    . Multiple Vitamin (MULTIVITAMIN) tablet Take 1 tablet by mouth. Ultra vitamin-Take 2 capsules daily    . Omega-3 Fatty Acids (SALMON OIL PO) Take 105 mg by mouth. Take 2 tablets daily    . Polyethyl Glycol-Propyl Glycol (SYSTANE ULTRA) 0.4-0.3 % SOLN Apply 1 drop to eye 2 (two) times daily.    . Probiotic Product (PRO-BIOTIC BLEND  PO) Take 1 capsule by mouth daily.    . Simethicone (GAS-X PO) Take 1 capsule by mouth 3 (three) times daily. Uses [employer] brand    . Testosterone (ANDROGEL) 40.5 MG/2.5GM (1.62%) GEL Place 1 Squirt onto the skin every evening.     . traMADol (ULTRAM) 50 MG tablet Take by mouth every 6 (six) hours as needed.     No facility-administered medications prior to visit.    Past Medical History:  Diagnosis Date  . Arthritis   . 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 GERD (gastroesophageal reflux disease) Hx in past.  . History of kidney stones 12/14   Lithotrispy  . Hypertension    Past Surgical History:  Procedure Laterality Date  . ANTERIOR CERVICAL DECOMPRESSION/DISCECTOMY FUSION 4 LEVELS N/A 06/30/2012   Procedure: ANTERIOR CERVICAL DECOMPRESSION/DISCECTOMY FUSION 4 LEVELS;  Surgeon: Kristeen Miss, MD;  Location: [facility];  Service: Neurosurgery;  Laterality: N/A;  C3-4 C4-5 C5-6 C6-7 Anterior cervical decompression/diskectomy/fusion  . BACK SURGERY  08/2010   lumbar fusion  . CHOLECYSTECTOMY  1990's  . NECK SURGERY     x2  . sinus surgeries     x2  . SKIN CANCER EXCISION     arm chest and head basal cell  . TRANSURETHRAL RESECTION OF PROSTATE  1999   Social History   Social  History  . Marital status: Married    Spouse name: Hoyle Sauer  . Number of children: 1  . Years of education: N/A   Occupational History  . retired    Social History Main Topics  . Smoking status: Never Smoker  . Smokeless tobacco: Never Used  . Alcohol use 4.2 oz/week    7 Cans of beer per week  . Drug use: No  . Sexual activity: Not Asked   Other Topics Concern  . None   Social History Narrative  . None   Family History  Problem Relation Age of Onset  . Tongue cancer Brother   . Heart disease Brother   . Heart disease Mother   . Heart disease Sister   . Colon cancer Neg Hx        Physical Exam: General: Well developed, well nourished, no acute distress Head:  Normocephalic and atraumatic Eyes:  sclerae anicteric, EOMI Ears: Normal auditory acuity Mouth: No deformity or lesions Lungs: Clear throughout to auscultation Heart: Regular rate and rhythm; no murmurs, rubs or bruits Abdomen: Soft, non tender and non distended. No masses, hepatosplenomegaly or hernias noted. Normal Bowel sounds Musculoskeletal: Symmetrical with no gross deformities  Pulses:  Normal pulses noted Extremities: No clubbing, cyanosis, edema or deformities noted Neurological: Alert oriented x 4, grossly nonfocal Psychological:  Alert and cooperative. Normal mood and affect  Assessment and Recommendations:  1. Abdominal pain, flatulence, gas. 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 Take dicyclomine to 10 mg 3 times a day before meals as prescribed. Take Gas-X 1-2 after all meals. Begin a low gas diet. Avoid any foods that exacerbate symptoms. Continue AcipHex 20 mg daily and follow standard antireflux measures. Pepcid 20 mg daily prn breakthrough GERD symptoms. Consider EGD and abd/pelvic CT if symptoms not controlled. REV in 6-8 weeks.

## 2016-04-30 NOTE — Patient Instructions (Signed)
Take your Bentyl three times a day before meals.   You can take Gas-x 1-2 capsules after every meal.  You have been given a Low gas diet.

## 2016-05-01 ENCOUNTER — Ambulatory Visit
Admission: RE | Admit: 2016-05-01 | Discharge: 2016-05-01 | Disposition: A | Discharge status: Home / Self Care | Payer: PPO | Source: Ambulatory Visit | Attending: Neurological Surgery | Admitting: Neurological Surgery

## 2016-05-01 DIAGNOSIS — M5127 Other intervertebral disc displacement, lumbosacral region: Secondary | ICD-10-CM | POA: Diagnosis not present

## 2016-05-01 DIAGNOSIS — M48062 Spinal stenosis, lumbar region with neurogenic claudication: Secondary | ICD-10-CM

## 2016-05-01 DIAGNOSIS — M48061 Spinal stenosis, lumbar region without neurogenic claudication: Secondary | ICD-10-CM | POA: Diagnosis not present

## 2016-05-01 MED ORDER — GADOBENATE DIMEGLUMINE 529 MG/ML IV SOLN
18.0000 mL | Freq: Once | INTRAVENOUS | Status: AC | PRN
  Administered 2016-05-01: 18 mL via INTRAVENOUS

## 2016-05-12 DIAGNOSIS — M48062 Spinal stenosis, lumbar region with neurogenic claudication: Secondary | ICD-10-CM | POA: Diagnosis not present

## 2016-05-31 DIAGNOSIS — M4804 Spinal stenosis, thoracic region: Secondary | ICD-10-CM | POA: Diagnosis not present

## 2016-05-31 DIAGNOSIS — M5415 Radiculopathy, thoracolumbar region: Secondary | ICD-10-CM | POA: Diagnosis not present

## 2016-05-31 DIAGNOSIS — M4725 Other spondylosis with radiculopathy, thoracolumbar region: Secondary | ICD-10-CM | POA: Diagnosis not present

## 2016-05-31 DIAGNOSIS — M5135 Other intervertebral disc degeneration, thoracolumbar region: Secondary | ICD-10-CM | POA: Diagnosis not present

## 2016-06-01 DIAGNOSIS — C44612 Basal cell carcinoma of skin of right upper limb, including shoulder: Secondary | ICD-10-CM | POA: Diagnosis not present

## 2016-06-01 DIAGNOSIS — L570 Actinic keratosis: Secondary | ICD-10-CM | POA: Diagnosis not present

## 2016-06-02 ENCOUNTER — Ambulatory Visit: Payer: PPO | Admitting: Gastroenterology

## 2016-06-04 DIAGNOSIS — E7800 Pure hypercholesterolemia, unspecified: Secondary | ICD-10-CM | POA: Diagnosis not present

## 2016-06-04 DIAGNOSIS — N529 Male erectile dysfunction, unspecified: Secondary | ICD-10-CM | POA: Diagnosis not present

## 2016-06-04 DIAGNOSIS — I10 Essential (primary) hypertension: Secondary | ICD-10-CM | POA: Diagnosis not present

## 2016-06-04 DIAGNOSIS — F329 Major depressive disorder, single episode, unspecified: Secondary | ICD-10-CM | POA: Diagnosis not present

## 2016-06-04 DIAGNOSIS — K219 Gastro-esophageal reflux disease without esophagitis: Secondary | ICD-10-CM | POA: Diagnosis not present

## 2016-06-04 DIAGNOSIS — E119 Type 2 diabetes mellitus without complications: Secondary | ICD-10-CM | POA: Diagnosis not present

## 2016-06-04 DIAGNOSIS — J302 Other seasonal allergic rhinitis: Secondary | ICD-10-CM | POA: Diagnosis not present

## 2016-06-04 DIAGNOSIS — M545 Low back pain: Secondary | ICD-10-CM | POA: Diagnosis not present

## 2016-06-04 DIAGNOSIS — F419 Anxiety disorder, unspecified: Secondary | ICD-10-CM | POA: Diagnosis not present

## 2016-06-04 DIAGNOSIS — E349 Endocrine disorder, unspecified: Secondary | ICD-10-CM | POA: Diagnosis not present

## 2016-06-04 DIAGNOSIS — N400 Enlarged prostate without lower urinary tract symptoms: Secondary | ICD-10-CM | POA: Diagnosis not present

## 2016-06-04 DIAGNOSIS — M25552 Pain in left hip: Secondary | ICD-10-CM | POA: Diagnosis not present

## 2016-07-01 ENCOUNTER — Ambulatory Visit: Payer: PPO | Admitting: Gastroenterology

## 2016-07-06 DIAGNOSIS — C44612 Basal cell carcinoma of skin of right upper limb, including shoulder: Secondary | ICD-10-CM | POA: Diagnosis not present

## 2016-07-08 DIAGNOSIS — E119 Type 2 diabetes mellitus without complications: Secondary | ICD-10-CM | POA: Diagnosis not present

## 2016-07-08 DIAGNOSIS — E349 Endocrine disorder, unspecified: Secondary | ICD-10-CM | POA: Diagnosis not present

## 2016-07-08 DIAGNOSIS — I10 Essential (primary) hypertension: Secondary | ICD-10-CM | POA: Diagnosis not present

## 2016-07-08 DIAGNOSIS — K219 Gastro-esophageal reflux disease without esophagitis: Secondary | ICD-10-CM | POA: Diagnosis not present

## 2016-07-08 DIAGNOSIS — F419 Anxiety disorder, unspecified: Secondary | ICD-10-CM | POA: Diagnosis not present

## 2016-07-08 DIAGNOSIS — J302 Other seasonal allergic rhinitis: Secondary | ICD-10-CM | POA: Diagnosis not present

## 2016-07-08 DIAGNOSIS — Z79891 Long term (current) use of opiate analgesic: Secondary | ICD-10-CM | POA: Diagnosis not present

## 2016-07-08 DIAGNOSIS — N529 Male erectile dysfunction, unspecified: Secondary | ICD-10-CM | POA: Diagnosis not present

## 2016-07-08 DIAGNOSIS — K589 Irritable bowel syndrome without diarrhea: Secondary | ICD-10-CM | POA: Diagnosis not present

## 2016-07-08 DIAGNOSIS — E7800 Pure hypercholesterolemia, unspecified: Secondary | ICD-10-CM | POA: Diagnosis not present

## 2016-07-08 DIAGNOSIS — M545 Low back pain: Secondary | ICD-10-CM | POA: Diagnosis not present

## 2016-07-08 DIAGNOSIS — N400 Enlarged prostate without lower urinary tract symptoms: Secondary | ICD-10-CM | POA: Diagnosis not present

## 2016-07-22 DIAGNOSIS — L570 Actinic keratosis: Secondary | ICD-10-CM | POA: Diagnosis not present

## 2016-07-27 DIAGNOSIS — M2041 Other hammer toe(s) (acquired), right foot: Secondary | ICD-10-CM | POA: Diagnosis not present

## 2016-07-27 DIAGNOSIS — M7741 Metatarsalgia, right foot: Secondary | ICD-10-CM | POA: Insufficient documentation

## 2016-08-11 ENCOUNTER — Ambulatory Visit: Payer: PPO | Admitting: Gastroenterology

## 2016-08-24 DIAGNOSIS — K219 Gastro-esophageal reflux disease without esophagitis: Secondary | ICD-10-CM | POA: Diagnosis not present

## 2016-08-24 DIAGNOSIS — M545 Low back pain: Secondary | ICD-10-CM | POA: Diagnosis not present

## 2016-08-24 DIAGNOSIS — J302 Other seasonal allergic rhinitis: Secondary | ICD-10-CM | POA: Diagnosis not present

## 2016-08-24 DIAGNOSIS — Z139 Encounter for screening, unspecified: Secondary | ICD-10-CM | POA: Diagnosis not present

## 2016-08-24 DIAGNOSIS — E349 Endocrine disorder, unspecified: Secondary | ICD-10-CM | POA: Diagnosis not present

## 2016-08-24 DIAGNOSIS — Z6825 Body mass index (BMI) 25.0-25.9, adult: Secondary | ICD-10-CM | POA: Diagnosis not present

## 2016-08-24 DIAGNOSIS — N529 Male erectile dysfunction, unspecified: Secondary | ICD-10-CM | POA: Diagnosis not present

## 2016-08-24 DIAGNOSIS — E7800 Pure hypercholesterolemia, unspecified: Secondary | ICD-10-CM | POA: Diagnosis not present

## 2016-08-24 DIAGNOSIS — I10 Essential (primary) hypertension: Secondary | ICD-10-CM | POA: Diagnosis not present

## 2016-08-24 DIAGNOSIS — Z1389 Encounter for screening for other disorder: Secondary | ICD-10-CM | POA: Diagnosis not present

## 2016-09-07 DIAGNOSIS — M7741 Metatarsalgia, right foot: Secondary | ICD-10-CM | POA: Diagnosis not present

## 2016-09-17 DIAGNOSIS — E785 Hyperlipidemia, unspecified: Secondary | ICD-10-CM | POA: Diagnosis not present

## 2016-09-17 DIAGNOSIS — Z0000 Encounter for general adult medical examination without abnormal findings: Secondary | ICD-10-CM | POA: Diagnosis not present

## 2016-09-17 DIAGNOSIS — Z125 Encounter for screening for malignant neoplasm of prostate: Secondary | ICD-10-CM | POA: Diagnosis not present

## 2016-09-17 DIAGNOSIS — Z1389 Encounter for screening for other disorder: Secondary | ICD-10-CM | POA: Diagnosis not present

## 2016-09-17 DIAGNOSIS — Z9181 History of falling: Secondary | ICD-10-CM | POA: Diagnosis not present

## 2016-09-25 DIAGNOSIS — M545 Low back pain: Secondary | ICD-10-CM | POA: Diagnosis not present

## 2016-09-25 DIAGNOSIS — N400 Enlarged prostate without lower urinary tract symptoms: Secondary | ICD-10-CM | POA: Diagnosis not present

## 2016-09-25 DIAGNOSIS — K219 Gastro-esophageal reflux disease without esophagitis: Secondary | ICD-10-CM | POA: Diagnosis not present

## 2016-09-25 DIAGNOSIS — J302 Other seasonal allergic rhinitis: Secondary | ICD-10-CM | POA: Diagnosis not present

## 2016-09-25 DIAGNOSIS — N529 Male erectile dysfunction, unspecified: Secondary | ICD-10-CM | POA: Diagnosis not present

## 2016-09-25 DIAGNOSIS — E349 Endocrine disorder, unspecified: Secondary | ICD-10-CM | POA: Diagnosis not present

## 2016-09-25 DIAGNOSIS — F419 Anxiety disorder, unspecified: Secondary | ICD-10-CM | POA: Diagnosis not present

## 2016-09-25 DIAGNOSIS — R3129 Other microscopic hematuria: Secondary | ICD-10-CM | POA: Diagnosis not present

## 2016-09-25 DIAGNOSIS — E119 Type 2 diabetes mellitus without complications: Secondary | ICD-10-CM | POA: Diagnosis not present

## 2016-09-25 DIAGNOSIS — K589 Irritable bowel syndrome without diarrhea: Secondary | ICD-10-CM | POA: Diagnosis not present

## 2016-09-25 DIAGNOSIS — F341 Dysthymic disorder: Secondary | ICD-10-CM | POA: Diagnosis not present

## 2016-09-25 DIAGNOSIS — E7800 Pure hypercholesterolemia, unspecified: Secondary | ICD-10-CM | POA: Diagnosis not present

## 2016-10-05 DIAGNOSIS — R51 Headache: Secondary | ICD-10-CM | POA: Diagnosis not present

## 2016-10-05 DIAGNOSIS — J3489 Other specified disorders of nose and nasal sinuses: Secondary | ICD-10-CM | POA: Diagnosis not present

## 2016-10-05 DIAGNOSIS — J0111 Acute recurrent frontal sinusitis: Secondary | ICD-10-CM | POA: Diagnosis not present

## 2016-10-14 DIAGNOSIS — M545 Low back pain: Secondary | ICD-10-CM | POA: Diagnosis not present

## 2016-10-14 DIAGNOSIS — K589 Irritable bowel syndrome without diarrhea: Secondary | ICD-10-CM | POA: Diagnosis not present

## 2016-10-14 DIAGNOSIS — N529 Male erectile dysfunction, unspecified: Secondary | ICD-10-CM | POA: Diagnosis not present

## 2016-10-14 DIAGNOSIS — K219 Gastro-esophageal reflux disease without esophagitis: Secondary | ICD-10-CM | POA: Diagnosis not present

## 2016-10-14 DIAGNOSIS — N400 Enlarged prostate without lower urinary tract symptoms: Secondary | ICD-10-CM | POA: Diagnosis not present

## 2016-10-14 DIAGNOSIS — F419 Anxiety disorder, unspecified: Secondary | ICD-10-CM | POA: Diagnosis not present

## 2016-10-14 DIAGNOSIS — E7800 Pure hypercholesterolemia, unspecified: Secondary | ICD-10-CM | POA: Diagnosis not present

## 2016-10-14 DIAGNOSIS — R3129 Other microscopic hematuria: Secondary | ICD-10-CM | POA: Diagnosis not present

## 2016-10-14 DIAGNOSIS — F341 Dysthymic disorder: Secondary | ICD-10-CM | POA: Diagnosis not present

## 2016-10-14 DIAGNOSIS — E349 Endocrine disorder, unspecified: Secondary | ICD-10-CM | POA: Diagnosis not present

## 2016-10-14 DIAGNOSIS — E119 Type 2 diabetes mellitus without complications: Secondary | ICD-10-CM | POA: Diagnosis not present

## 2016-10-14 DIAGNOSIS — I10 Essential (primary) hypertension: Secondary | ICD-10-CM | POA: Diagnosis not present

## 2016-10-18 DIAGNOSIS — J0111 Acute recurrent frontal sinusitis: Secondary | ICD-10-CM | POA: Diagnosis not present

## 2016-10-18 DIAGNOSIS — R51 Headache: Secondary | ICD-10-CM | POA: Diagnosis not present

## 2016-10-21 DIAGNOSIS — J0111 Acute recurrent frontal sinusitis: Secondary | ICD-10-CM | POA: Diagnosis not present

## 2016-10-21 DIAGNOSIS — R51 Headache: Secondary | ICD-10-CM | POA: Diagnosis not present

## 2016-10-28 ENCOUNTER — Encounter: Payer: Self-pay | Admitting: Sports Medicine

## 2016-10-28 ENCOUNTER — Ambulatory Visit (INDEPENDENT_AMBULATORY_CARE_PROVIDER_SITE_OTHER): Payer: PPO | Admitting: Sports Medicine

## 2016-10-28 ENCOUNTER — Ambulatory Visit (INDEPENDENT_AMBULATORY_CARE_PROVIDER_SITE_OTHER): Payer: PPO

## 2016-10-28 DIAGNOSIS — M19071 Primary osteoarthritis, right ankle and foot: Secondary | ICD-10-CM

## 2016-10-28 DIAGNOSIS — S92301K Fracture of unspecified metatarsal bone(s), right foot, subsequent encounter for fracture with nonunion: Secondary | ICD-10-CM | POA: Diagnosis not present

## 2016-10-28 DIAGNOSIS — M79671 Pain in right foot: Secondary | ICD-10-CM | POA: Diagnosis not present

## 2016-10-28 DIAGNOSIS — G8929 Other chronic pain: Secondary | ICD-10-CM

## 2016-10-28 MED ORDER — MELOXICAM 15 MG PO TABS: 15.0000 mg | ORAL_TABLET | Freq: Every day | ORAL | 0 refills | Status: DC

## 2016-10-28 NOTE — Progress Notes (Signed)
   Subjective:    Patient ID: Daniel Wagner, male    DOB: January 04, 1941, 76 y.o.   MRN: [id number]  HPI   I fractured right 2nd and 3rd last year.  I have been seeing Dr Gretta Arab but almost a year later still having pain and swelling wanted to see you when it happened but no appts were available at the time.  I just want to know what I need to do.      Review of Systems  HENT: Positive for sinus pain and sinus pressure.   Cardiovascular: Positive for leg swelling.  Musculoskeletal: Positive for back pain and myalgias.  Neurological: Positive for headaches.  Hematological: Bruises/bleeds easily.       Objective:   Physical Exam        Assessment & Plan:

## 2016-10-29 NOTE — Progress Notes (Signed)
Subjective: Daniel Wagner is a 76 y.o. male patient who presents to office for evaluation of continued right foot pain foot pain. Patient states that he fractured his foot last year and was treated by Dr. Gretta Arab states that he was in a boot for about 8 weeks and during this period. He was told that the fractures were improving after 8 weeks. He was released from being in the boot and at a follow up visit. Reports that the doctor was concerned that the fracture may have slightly healed and an abnormal position. Patient states that the Dr. thought it would be okay and gave him insoles and told him to get better shoes. However, still he experience pain and swelling. Patient denies any other pedal complaints. Denies any new recent injury/trip/fall/sprain/any other causative factors.   Patient Active Problem List   Diagnosis Date Noted  . Gas pain 03/01/2016  . Bloating 03/01/2016  . Abdominal pain, epigastric 03/01/2016  . Gastroesophageal reflux disease 03/01/2016  . Lumbar stenosis with neurogenic claudication 03/19/2014  . 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 doxazosin (CARDURA) 4 MG tablet Take 4 mg by mouth daily.     . fluticasone (FLONASE) 50 MCG/ACT nasal spray Place 2 sprays into the nose daily as needed (for stuffiness).    Marland Kitchen glucosamine-chondroitin 500-400 MG tablet Take 1 tablet by mouth 3 (three) times daily.    Marland Kitchen guaiFENesin (MUCINEX) 600 MG 12 hr tablet Take 600 mg by mouth daily.    Marland Kitchen HYDROcodone-acetaminophen (NORCO/VICODIN) 5-325 MG per tablet Take 1-2 tablets by mouth 2 (two) times daily as needed for pain. 40 tablet 0  . lisinopril (PRINIVIL,ZESTRIL) 20 MG tablet Take 20 mg by mouth every evening.     . Multiple Vitamin (MULTIVITAMIN) tablet Take 1 tablet by mouth. Ultra vitamin-Take 2 capsules daily    . Omega-3 Fatty Acids (SALMON OIL PO) Take 105 mg by mouth.  Take 2 tablets daily    . Polyethyl Glycol-Propyl Glycol (SYSTANE ULTRA) 0.4-0.3 % SOLN Apply 1 drop to eye 2 (two) times daily.    . Probiotic Product (PRO-BIOTIC BLEND PO) Take 1 capsule by mouth daily.    . RABEprazole (ACIPHEX) 20 MG tablet Take 20 mg by mouth daily.    . Testosterone (ANDROGEL) 40.5 MG/2.5GM (1.62%) GEL Place 1 Squirt onto the skin every evening.     . dicyclomine (BENTYL) 10 MG capsule Take 10 mg by mouth 3 (three) times daily before meals.    . lovastatin (MEVACOR) 40 MG tablet Take 40 mg by mouth at bedtime.    . Simethicone (GAS-X PO) Take 1 capsule by mouth 3 (three) times daily. Uses [employer] brand    . traMADol (ULTRAM) 50 MG tablet Take by mouth every 6 (six) hours as needed.     No current facility-administered medications on file prior to visit.     Allergies  Allergen Reactions  . Aspirin Other (See Comments)    Jittery if taking large quantities    Objective:  General: Alert and oriented x3 in no acute distress  Dermatology: No open lesions bilateral lower extremities, no webspace macerations, no ecchymosis bilateral, all nails x 10 are well manicured.  Vascular: Dorsalis Pedis and Posterior Tibial pedal pulses palpable, Capillary Fill Time 3 seconds,(+) pedal hair growth bilateral, no edema bilateral lower extremities. However, subjectively Patient states at end day the top of his foot is swollen,  Temperature gradient within normal limits.  Neurology: Johney Maine sensation intact via light touch bilateral. (- )Tinels sign bilateral.   Musculoskeletal: Mild tenderness with palpation at dorsal midfoot on right with now palpable bone spurs. Strength within normal limits in all groups bilateral.   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 Areta Haber office for documentation purposes of the history of his fracture -I changed his meloxicam medicine to 15 mg daily to see if this will help. Meanwhile with the pain and swelling that he is having in the right foot -Recommend meanwhile, rest, ice, elevation, good supportive shoes that are stiff sole to offer protection to his right foot -Patient to return to office after starting bone stim therapy  or sooner if condition worsens.  Landis Martins, DPM

## 2016-11-01 ENCOUNTER — Telehealth: Payer: Self-pay | Admitting: *Deleted

## 2016-11-01 NOTE — Telephone Encounter (Addendum)
-----   Message from Landis Martins, [place] sent at 10/28/2016 11:58 AM EDT ----- Regarding: exogen bone stim Patient is bringing more records to office fracture 10-2015 that is poorly healed with secondary arthritis. 11/01/2016-I spoke with pt and he said he left the [facility] office and went right over to Dr. Thurman Coyer office to have the records sent to Dr. Leeanne Rio office. I told him I would check with the [place] office and see if the records had been received. I informed Lytle Michaels - [employer] of the request for Exogen Bone Growth Stimulator.

## 2016-11-10 DIAGNOSIS — J329 Chronic sinusitis, unspecified: Secondary | ICD-10-CM | POA: Diagnosis not present

## 2016-11-10 DIAGNOSIS — J321 Chronic frontal sinusitis: Secondary | ICD-10-CM | POA: Diagnosis not present

## 2016-11-10 DIAGNOSIS — R51 Headache: Secondary | ICD-10-CM | POA: Diagnosis not present

## 2016-11-10 NOTE — Telephone Encounter (Signed)
Called patient ended up speaking with his wife He had sinus surgery today and is resting. Informed her per DPR that we just wanted to follow up and let them know we had still not received the medical records from Dr Gretta Arab' office.  She stated that they would work on that she knows he wants to get his foot better. Told her I was sorry for the inconvenience and that I hoped he was on the mend soon.

## 2016-11-16 NOTE — Telephone Encounter (Signed)
You can let him that information and then if he wants to proceed then let bone stim company know -Dr. Chauncey Cruel

## 2016-11-16 NOTE — Telephone Encounter (Signed)
PLEASE LET THEM KNOW. WE CAN RE-XRAY IN 1 MONTH TO SEE IF FRACTURE GAP IS CHANGED AND APPLY AGAIN FOR BONE STIMULATOR. -DR. S

## 2016-11-16 NOTE — Telephone Encounter (Addendum)
Daniel Wagner - [facility] states criteria is not being met at this time for the bone growth stimulator, but if showed bone gap of < 15mm, would be covered. I spoke with Daniel Wagner - [facility] states she has approved the bone growth stimulator, due to the criteria states there must be < 15mm gap, and it has been a year without total healing. Ms Wagner states she has approved for the out of network cost, but another department will need to evaluated to see if covered at the in network cost, if pt ask.11/18/2016-Left message informing Daniel Wagner - [employer], pt's insurance had okayed [employer] at out of network cost and wanted to know if he had spoken with pt concerning getting the Exogen or if he need to have an appt. Daniel Wagner - [employer] states he had spoken with pt, pt has decided not to pursue Exogen due to cost of out-of-pocket, and declined [employer] Patient Assistance. 11/19/2016-I spoke with pt's wife, Daniel Wagner and informed of Dr. Stover's request to continue to follow pt's healing and for them to make an appt to be seen in the next two weeks or so. Daniel Wagner states understanding and I transferred to schedulers. 

## 2016-11-19 NOTE — Telephone Encounter (Signed)
Ok. He should follow up with me in office within the next 2 weeks to discuss other options since he doesn't want Exogen -Dr. Cannon Kettle

## 2016-11-25 DIAGNOSIS — F419 Anxiety disorder, unspecified: Secondary | ICD-10-CM | POA: Diagnosis not present

## 2016-11-25 DIAGNOSIS — K219 Gastro-esophageal reflux disease without esophagitis: Secondary | ICD-10-CM | POA: Diagnosis not present

## 2016-11-25 DIAGNOSIS — E119 Type 2 diabetes mellitus without complications: Secondary | ICD-10-CM | POA: Diagnosis not present

## 2016-11-25 DIAGNOSIS — F5101 Primary insomnia: Secondary | ICD-10-CM | POA: Diagnosis not present

## 2016-11-25 DIAGNOSIS — N529 Male erectile dysfunction, unspecified: Secondary | ICD-10-CM | POA: Diagnosis not present

## 2016-11-25 DIAGNOSIS — M545 Low back pain: Secondary | ICD-10-CM | POA: Diagnosis not present

## 2016-11-25 DIAGNOSIS — R3129 Other microscopic hematuria: Secondary | ICD-10-CM | POA: Diagnosis not present

## 2016-11-25 DIAGNOSIS — K589 Irritable bowel syndrome without diarrhea: Secondary | ICD-10-CM | POA: Diagnosis not present

## 2016-11-25 DIAGNOSIS — E349 Endocrine disorder, unspecified: Secondary | ICD-10-CM | POA: Diagnosis not present

## 2016-11-25 DIAGNOSIS — N400 Enlarged prostate without lower urinary tract symptoms: Secondary | ICD-10-CM | POA: Diagnosis not present

## 2016-11-25 DIAGNOSIS — I10 Essential (primary) hypertension: Secondary | ICD-10-CM | POA: Diagnosis not present

## 2016-11-25 DIAGNOSIS — F341 Dysthymic disorder: Secondary | ICD-10-CM | POA: Diagnosis not present

## 2016-12-07 DIAGNOSIS — E349 Endocrine disorder, unspecified: Secondary | ICD-10-CM | POA: Diagnosis not present

## 2016-12-07 DIAGNOSIS — R3129 Other microscopic hematuria: Secondary | ICD-10-CM | POA: Diagnosis not present

## 2016-12-07 DIAGNOSIS — F419 Anxiety disorder, unspecified: Secondary | ICD-10-CM | POA: Diagnosis not present

## 2016-12-07 DIAGNOSIS — M545 Low back pain: Secondary | ICD-10-CM | POA: Diagnosis not present

## 2016-12-07 DIAGNOSIS — F341 Dysthymic disorder: Secondary | ICD-10-CM | POA: Diagnosis not present

## 2016-12-07 DIAGNOSIS — K589 Irritable bowel syndrome without diarrhea: Secondary | ICD-10-CM | POA: Diagnosis not present

## 2016-12-07 DIAGNOSIS — E119 Type 2 diabetes mellitus without complications: Secondary | ICD-10-CM | POA: Diagnosis not present

## 2016-12-07 DIAGNOSIS — K219 Gastro-esophageal reflux disease without esophagitis: Secondary | ICD-10-CM | POA: Diagnosis not present

## 2016-12-07 DIAGNOSIS — N529 Male erectile dysfunction, unspecified: Secondary | ICD-10-CM | POA: Diagnosis not present

## 2016-12-07 DIAGNOSIS — N400 Enlarged prostate without lower urinary tract symptoms: Secondary | ICD-10-CM | POA: Diagnosis not present

## 2016-12-07 DIAGNOSIS — I10 Essential (primary) hypertension: Secondary | ICD-10-CM | POA: Diagnosis not present

## 2016-12-18 DIAGNOSIS — R3129 Other microscopic hematuria: Secondary | ICD-10-CM | POA: Diagnosis not present

## 2016-12-18 DIAGNOSIS — Z9181 History of falling: Secondary | ICD-10-CM | POA: Diagnosis not present

## 2016-12-18 DIAGNOSIS — N529 Male erectile dysfunction, unspecified: Secondary | ICD-10-CM | POA: Diagnosis not present

## 2016-12-18 DIAGNOSIS — K219 Gastro-esophageal reflux disease without esophagitis: Secondary | ICD-10-CM | POA: Diagnosis not present

## 2016-12-18 DIAGNOSIS — E349 Endocrine disorder, unspecified: Secondary | ICD-10-CM | POA: Diagnosis not present

## 2016-12-18 DIAGNOSIS — F419 Anxiety disorder, unspecified: Secondary | ICD-10-CM | POA: Diagnosis not present

## 2016-12-18 DIAGNOSIS — K589 Irritable bowel syndrome without diarrhea: Secondary | ICD-10-CM | POA: Diagnosis not present

## 2016-12-18 DIAGNOSIS — F341 Dysthymic disorder: Secondary | ICD-10-CM | POA: Diagnosis not present

## 2016-12-18 DIAGNOSIS — E119 Type 2 diabetes mellitus without complications: Secondary | ICD-10-CM | POA: Diagnosis not present

## 2016-12-18 DIAGNOSIS — N400 Enlarged prostate without lower urinary tract symptoms: Secondary | ICD-10-CM | POA: Diagnosis not present

## 2016-12-18 DIAGNOSIS — M25551 Pain in right hip: Secondary | ICD-10-CM | POA: Diagnosis not present

## 2016-12-18 DIAGNOSIS — M545 Low back pain: Secondary | ICD-10-CM | POA: Diagnosis not present

## 2016-12-23 DIAGNOSIS — F341 Dysthymic disorder: Secondary | ICD-10-CM | POA: Diagnosis not present

## 2016-12-23 DIAGNOSIS — F419 Anxiety disorder, unspecified: Secondary | ICD-10-CM | POA: Diagnosis not present

## 2016-12-23 DIAGNOSIS — K219 Gastro-esophageal reflux disease without esophagitis: Secondary | ICD-10-CM | POA: Diagnosis not present

## 2016-12-23 DIAGNOSIS — N529 Male erectile dysfunction, unspecified: Secondary | ICD-10-CM | POA: Diagnosis not present

## 2016-12-23 DIAGNOSIS — M545 Low back pain: Secondary | ICD-10-CM | POA: Diagnosis not present

## 2016-12-23 DIAGNOSIS — F5101 Primary insomnia: Secondary | ICD-10-CM | POA: Diagnosis not present

## 2016-12-23 DIAGNOSIS — R3129 Other microscopic hematuria: Secondary | ICD-10-CM | POA: Diagnosis not present

## 2016-12-23 DIAGNOSIS — E349 Endocrine disorder, unspecified: Secondary | ICD-10-CM | POA: Diagnosis not present

## 2016-12-23 DIAGNOSIS — I10 Essential (primary) hypertension: Secondary | ICD-10-CM | POA: Diagnosis not present

## 2016-12-23 DIAGNOSIS — E7800 Pure hypercholesterolemia, unspecified: Secondary | ICD-10-CM | POA: Diagnosis not present

## 2016-12-23 DIAGNOSIS — E119 Type 2 diabetes mellitus without complications: Secondary | ICD-10-CM | POA: Diagnosis not present

## 2016-12-23 DIAGNOSIS — K589 Irritable bowel syndrome without diarrhea: Secondary | ICD-10-CM | POA: Diagnosis not present

## 2016-12-30 DIAGNOSIS — M48062 Spinal stenosis, lumbar region with neurogenic claudication: Secondary | ICD-10-CM | POA: Diagnosis not present

## 2016-12-30 DIAGNOSIS — M546 Pain in thoracic spine: Secondary | ICD-10-CM | POA: Insufficient documentation

## 2016-12-30 DIAGNOSIS — R110 Nausea: Secondary | ICD-10-CM | POA: Insufficient documentation

## 2016-12-30 DIAGNOSIS — G8929 Other chronic pain: Secondary | ICD-10-CM | POA: Diagnosis not present

## 2016-12-31 ENCOUNTER — Other Ambulatory Visit: Payer: Self-pay | Admitting: Neurological Surgery

## 2016-12-31 DIAGNOSIS — R110 Nausea: Secondary | ICD-10-CM

## 2016-12-31 DIAGNOSIS — M4727 Other spondylosis with radiculopathy, lumbosacral region: Secondary | ICD-10-CM | POA: Diagnosis not present

## 2016-12-31 DIAGNOSIS — M5116 Intervertebral disc disorders with radiculopathy, lumbar region: Secondary | ICD-10-CM | POA: Diagnosis not present

## 2016-12-31 DIAGNOSIS — M5416 Radiculopathy, lumbar region: Secondary | ICD-10-CM | POA: Diagnosis not present

## 2016-12-31 DIAGNOSIS — Z981 Arthrodesis status: Secondary | ICD-10-CM | POA: Diagnosis not present

## 2016-12-31 DIAGNOSIS — M48061 Spinal stenosis, lumbar region without neurogenic claudication: Secondary | ICD-10-CM | POA: Diagnosis not present

## 2017-01-04 ENCOUNTER — Other Ambulatory Visit: Payer: PPO

## 2017-01-04 ENCOUNTER — Ambulatory Visit
Admission: RE | Admit: 2017-01-04 | Discharge: 2017-01-04 | Disposition: A | Discharge status: Home / Self Care | Payer: PPO | Source: Ambulatory Visit | Attending: Neurological Surgery | Admitting: Neurological Surgery

## 2017-01-04 DIAGNOSIS — R110 Nausea: Secondary | ICD-10-CM

## 2017-01-04 DIAGNOSIS — N200 Calculus of kidney: Secondary | ICD-10-CM | POA: Diagnosis not present

## 2017-01-04 MED ORDER — IOPAMIDOL (ISOVUE-300) INJECTION 61%
100.0000 mL | Freq: Once | INTRAVENOUS | Status: AC | PRN
  Administered 2017-01-04: 100 mL via INTRAVENOUS

## 2017-01-05 DIAGNOSIS — M48062 Spinal stenosis, lumbar region with neurogenic claudication: Secondary | ICD-10-CM | POA: Diagnosis not present

## 2017-01-05 DIAGNOSIS — I10 Essential (primary) hypertension: Secondary | ICD-10-CM | POA: Diagnosis not present

## 2017-01-11 ENCOUNTER — Other Ambulatory Visit: Payer: Self-pay | Admitting: Neurological Surgery

## 2017-01-11 DIAGNOSIS — M48062 Spinal stenosis, lumbar region with neurogenic claudication: Secondary | ICD-10-CM

## 2017-01-15 ENCOUNTER — Ambulatory Visit
Admission: RE | Admit: 2017-01-15 | Discharge: 2017-01-15 | Disposition: A | Discharge status: Home / Self Care | Payer: PPO | Source: Ambulatory Visit | Attending: Neurological Surgery | Admitting: Neurological Surgery

## 2017-01-15 DIAGNOSIS — M48062 Spinal stenosis, lumbar region with neurogenic claudication: Secondary | ICD-10-CM

## 2017-01-15 DIAGNOSIS — M4804 Spinal stenosis, thoracic region: Secondary | ICD-10-CM | POA: Diagnosis not present

## 2017-01-17 DIAGNOSIS — L570 Actinic keratosis: Secondary | ICD-10-CM | POA: Diagnosis not present

## 2017-01-18 DIAGNOSIS — R3129 Other microscopic hematuria: Secondary | ICD-10-CM | POA: Diagnosis not present

## 2017-01-18 DIAGNOSIS — F5101 Primary insomnia: Secondary | ICD-10-CM | POA: Diagnosis not present

## 2017-01-18 DIAGNOSIS — I10 Essential (primary) hypertension: Secondary | ICD-10-CM | POA: Diagnosis not present

## 2017-01-18 DIAGNOSIS — F419 Anxiety disorder, unspecified: Secondary | ICD-10-CM | POA: Diagnosis not present

## 2017-01-18 DIAGNOSIS — K589 Irritable bowel syndrome without diarrhea: Secondary | ICD-10-CM | POA: Diagnosis not present

## 2017-01-18 DIAGNOSIS — M545 Low back pain: Secondary | ICD-10-CM | POA: Diagnosis not present

## 2017-01-18 DIAGNOSIS — K219 Gastro-esophageal reflux disease without esophagitis: Secondary | ICD-10-CM | POA: Diagnosis not present

## 2017-01-18 DIAGNOSIS — E7800 Pure hypercholesterolemia, unspecified: Secondary | ICD-10-CM | POA: Diagnosis not present

## 2017-01-18 DIAGNOSIS — F341 Dysthymic disorder: Secondary | ICD-10-CM | POA: Diagnosis not present

## 2017-01-18 DIAGNOSIS — E349 Endocrine disorder, unspecified: Secondary | ICD-10-CM | POA: Diagnosis not present

## 2017-01-18 DIAGNOSIS — N529 Male erectile dysfunction, unspecified: Secondary | ICD-10-CM | POA: Diagnosis not present

## 2017-01-18 DIAGNOSIS — E119 Type 2 diabetes mellitus without complications: Secondary | ICD-10-CM | POA: Diagnosis not present

## 2017-01-19 DIAGNOSIS — M47894 Other spondylosis, thoracic region: Secondary | ICD-10-CM | POA: Diagnosis not present

## 2017-01-19 DIAGNOSIS — M5124 Other intervertebral disc displacement, thoracic region: Secondary | ICD-10-CM | POA: Diagnosis not present

## 2017-01-19 DIAGNOSIS — M47816 Spondylosis without myelopathy or radiculopathy, lumbar region: Secondary | ICD-10-CM | POA: Insufficient documentation

## 2017-01-19 DIAGNOSIS — I10 Essential (primary) hypertension: Secondary | ICD-10-CM | POA: Diagnosis not present

## 2017-01-28 DIAGNOSIS — M5114 Intervertebral disc disorders with radiculopathy, thoracic region: Secondary | ICD-10-CM | POA: Diagnosis not present

## 2017-01-28 DIAGNOSIS — M5124 Other intervertebral disc displacement, thoracic region: Secondary | ICD-10-CM | POA: Diagnosis not present

## 2017-02-15 DIAGNOSIS — F5101 Primary insomnia: Secondary | ICD-10-CM | POA: Diagnosis not present

## 2017-02-15 DIAGNOSIS — E349 Endocrine disorder, unspecified: Secondary | ICD-10-CM | POA: Diagnosis not present

## 2017-02-15 DIAGNOSIS — I10 Essential (primary) hypertension: Secondary | ICD-10-CM | POA: Diagnosis not present

## 2017-02-15 DIAGNOSIS — M545 Low back pain: Secondary | ICD-10-CM | POA: Diagnosis not present

## 2017-02-15 DIAGNOSIS — K589 Irritable bowel syndrome without diarrhea: Secondary | ICD-10-CM | POA: Diagnosis not present

## 2017-02-15 DIAGNOSIS — F341 Dysthymic disorder: Secondary | ICD-10-CM | POA: Diagnosis not present

## 2017-02-15 DIAGNOSIS — F419 Anxiety disorder, unspecified: Secondary | ICD-10-CM | POA: Diagnosis not present

## 2017-02-15 DIAGNOSIS — E119 Type 2 diabetes mellitus without complications: Secondary | ICD-10-CM | POA: Diagnosis not present

## 2017-02-15 DIAGNOSIS — N529 Male erectile dysfunction, unspecified: Secondary | ICD-10-CM | POA: Diagnosis not present

## 2017-02-15 DIAGNOSIS — R197 Diarrhea, unspecified: Secondary | ICD-10-CM | POA: Diagnosis not present

## 2017-02-15 DIAGNOSIS — R3129 Other microscopic hematuria: Secondary | ICD-10-CM | POA: Diagnosis not present

## 2017-02-15 DIAGNOSIS — E7800 Pure hypercholesterolemia, unspecified: Secondary | ICD-10-CM | POA: Diagnosis not present

## 2017-03-10 DIAGNOSIS — S92331K Displaced fracture of third metatarsal bone, right foot, subsequent encounter for fracture with nonunion: Secondary | ICD-10-CM | POA: Insufficient documentation

## 2017-03-10 DIAGNOSIS — M79671 Pain in right foot: Secondary | ICD-10-CM | POA: Diagnosis not present

## 2017-03-10 DIAGNOSIS — S92321K Displaced fracture of second metatarsal bone, right foot, subsequent encounter for fracture with nonunion: Secondary | ICD-10-CM | POA: Diagnosis not present

## 2017-03-10 DIAGNOSIS — M7741 Metatarsalgia, right foot: Secondary | ICD-10-CM | POA: Diagnosis not present

## 2017-03-10 DIAGNOSIS — G8929 Other chronic pain: Secondary | ICD-10-CM | POA: Insufficient documentation

## 2017-03-10 DIAGNOSIS — M84374D Stress fracture, right foot, subsequent encounter for fracture with routine healing: Secondary | ICD-10-CM | POA: Diagnosis not present

## 2017-04-13 DIAGNOSIS — E349 Endocrine disorder, unspecified: Secondary | ICD-10-CM | POA: Diagnosis not present

## 2017-04-13 DIAGNOSIS — N529 Male erectile dysfunction, unspecified: Secondary | ICD-10-CM | POA: Diagnosis not present

## 2017-04-13 DIAGNOSIS — E119 Type 2 diabetes mellitus without complications: Secondary | ICD-10-CM | POA: Diagnosis not present

## 2017-04-13 DIAGNOSIS — F5101 Primary insomnia: Secondary | ICD-10-CM | POA: Diagnosis not present

## 2017-04-13 DIAGNOSIS — R3129 Other microscopic hematuria: Secondary | ICD-10-CM | POA: Diagnosis not present

## 2017-04-13 DIAGNOSIS — J208 Acute bronchitis due to other specified organisms: Secondary | ICD-10-CM | POA: Diagnosis not present

## 2017-04-13 DIAGNOSIS — K589 Irritable bowel syndrome without diarrhea: Secondary | ICD-10-CM | POA: Diagnosis not present

## 2017-04-13 DIAGNOSIS — K219 Gastro-esophageal reflux disease without esophagitis: Secondary | ICD-10-CM | POA: Diagnosis not present

## 2017-04-13 DIAGNOSIS — I10 Essential (primary) hypertension: Secondary | ICD-10-CM | POA: Diagnosis not present

## 2017-04-13 DIAGNOSIS — F419 Anxiety disorder, unspecified: Secondary | ICD-10-CM | POA: Diagnosis not present

## 2017-04-13 DIAGNOSIS — F341 Dysthymic disorder: Secondary | ICD-10-CM | POA: Diagnosis not present

## 2017-04-13 DIAGNOSIS — E7800 Pure hypercholesterolemia, unspecified: Secondary | ICD-10-CM | POA: Diagnosis not present

## 2017-05-18 DIAGNOSIS — M79671 Pain in right foot: Secondary | ICD-10-CM | POA: Diagnosis not present

## 2017-05-18 DIAGNOSIS — S92324K Nondisplaced fracture of second metatarsal bone, right foot, subsequent encounter for fracture with nonunion: Secondary | ICD-10-CM | POA: Diagnosis not present

## 2017-05-22 ENCOUNTER — Other Ambulatory Visit: Payer: Self-pay | Admitting: Orthopedic Surgery

## 2017-05-22 DIAGNOSIS — S92324K Nondisplaced fracture of second metatarsal bone, right foot, subsequent encounter for fracture with nonunion: Secondary | ICD-10-CM

## 2017-05-30 ENCOUNTER — Ambulatory Visit
Admission: RE | Admit: 2017-05-30 | Discharge: 2017-05-30 | Disposition: A | Discharge status: Home / Self Care | Payer: PPO | Source: Ambulatory Visit | Attending: Orthopedic Surgery | Admitting: Orthopedic Surgery

## 2017-05-30 DIAGNOSIS — S92324K Nondisplaced fracture of second metatarsal bone, right foot, subsequent encounter for fracture with nonunion: Secondary | ICD-10-CM

## 2017-05-30 DIAGNOSIS — S92324A Nondisplaced fracture of second metatarsal bone, right foot, initial encounter for closed fracture: Secondary | ICD-10-CM | POA: Diagnosis not present

## 2017-06-01 DIAGNOSIS — M14671 Charcot's joint, right ankle and foot: Secondary | ICD-10-CM | POA: Diagnosis not present

## 2017-06-07 DIAGNOSIS — D0439 Carcinoma in situ of skin of other parts of face: Secondary | ICD-10-CM | POA: Diagnosis not present

## 2017-06-07 DIAGNOSIS — C44712 Basal cell carcinoma of skin of right lower limb, including hip: Secondary | ICD-10-CM | POA: Diagnosis not present

## 2017-06-07 DIAGNOSIS — C44622 Squamous cell carcinoma of skin of right upper limb, including shoulder: Secondary | ICD-10-CM | POA: Diagnosis not present

## 2017-06-08 DIAGNOSIS — E349 Endocrine disorder, unspecified: Secondary | ICD-10-CM | POA: Diagnosis not present

## 2017-06-08 DIAGNOSIS — K589 Irritable bowel syndrome without diarrhea: Secondary | ICD-10-CM | POA: Diagnosis not present

## 2017-06-08 DIAGNOSIS — F341 Dysthymic disorder: Secondary | ICD-10-CM | POA: Diagnosis not present

## 2017-06-08 DIAGNOSIS — E7800 Pure hypercholesterolemia, unspecified: Secondary | ICD-10-CM | POA: Diagnosis not present

## 2017-06-08 DIAGNOSIS — E119 Type 2 diabetes mellitus without complications: Secondary | ICD-10-CM | POA: Diagnosis not present

## 2017-06-08 DIAGNOSIS — J302 Other seasonal allergic rhinitis: Secondary | ICD-10-CM | POA: Diagnosis not present

## 2017-06-08 DIAGNOSIS — N529 Male erectile dysfunction, unspecified: Secondary | ICD-10-CM | POA: Diagnosis not present

## 2017-06-08 DIAGNOSIS — K219 Gastro-esophageal reflux disease without esophagitis: Secondary | ICD-10-CM | POA: Diagnosis not present

## 2017-06-08 DIAGNOSIS — I10 Essential (primary) hypertension: Secondary | ICD-10-CM | POA: Diagnosis not present

## 2017-06-08 DIAGNOSIS — M545 Low back pain: Secondary | ICD-10-CM | POA: Diagnosis not present

## 2017-06-08 DIAGNOSIS — F5101 Primary insomnia: Secondary | ICD-10-CM | POA: Diagnosis not present

## 2017-06-08 DIAGNOSIS — F419 Anxiety disorder, unspecified: Secondary | ICD-10-CM | POA: Diagnosis not present

## 2017-07-07 DIAGNOSIS — C44622 Squamous cell carcinoma of skin of right upper limb, including shoulder: Secondary | ICD-10-CM | POA: Diagnosis not present

## 2017-07-08 DIAGNOSIS — E349 Endocrine disorder, unspecified: Secondary | ICD-10-CM | POA: Diagnosis not present

## 2017-07-08 DIAGNOSIS — K589 Irritable bowel syndrome without diarrhea: Secondary | ICD-10-CM | POA: Diagnosis not present

## 2017-07-08 DIAGNOSIS — E7800 Pure hypercholesterolemia, unspecified: Secondary | ICD-10-CM | POA: Diagnosis not present

## 2017-07-08 DIAGNOSIS — I10 Essential (primary) hypertension: Secondary | ICD-10-CM | POA: Diagnosis not present

## 2017-07-08 DIAGNOSIS — F341 Dysthymic disorder: Secondary | ICD-10-CM | POA: Diagnosis not present

## 2017-07-08 DIAGNOSIS — N529 Male erectile dysfunction, unspecified: Secondary | ICD-10-CM | POA: Diagnosis not present

## 2017-07-08 DIAGNOSIS — J302 Other seasonal allergic rhinitis: Secondary | ICD-10-CM | POA: Diagnosis not present

## 2017-07-08 DIAGNOSIS — M545 Low back pain: Secondary | ICD-10-CM | POA: Diagnosis not present

## 2017-07-08 DIAGNOSIS — F5101 Primary insomnia: Secondary | ICD-10-CM | POA: Diagnosis not present

## 2017-07-08 DIAGNOSIS — K219 Gastro-esophageal reflux disease without esophagitis: Secondary | ICD-10-CM | POA: Diagnosis not present

## 2017-07-08 DIAGNOSIS — F419 Anxiety disorder, unspecified: Secondary | ICD-10-CM | POA: Diagnosis not present

## 2017-07-08 DIAGNOSIS — E119 Type 2 diabetes mellitus without complications: Secondary | ICD-10-CM | POA: Diagnosis not present

## 2017-08-10 DIAGNOSIS — E349 Endocrine disorder, unspecified: Secondary | ICD-10-CM | POA: Diagnosis not present

## 2017-08-10 DIAGNOSIS — M14671 Charcot's joint, right ankle and foot: Secondary | ICD-10-CM | POA: Diagnosis not present

## 2017-08-10 DIAGNOSIS — F5101 Primary insomnia: Secondary | ICD-10-CM | POA: Diagnosis not present

## 2017-08-10 DIAGNOSIS — E7800 Pure hypercholesterolemia, unspecified: Secondary | ICD-10-CM | POA: Diagnosis not present

## 2017-08-10 DIAGNOSIS — M79671 Pain in right foot: Secondary | ICD-10-CM | POA: Diagnosis not present

## 2017-08-10 DIAGNOSIS — J302 Other seasonal allergic rhinitis: Secondary | ICD-10-CM | POA: Diagnosis not present

## 2017-08-10 DIAGNOSIS — K589 Irritable bowel syndrome without diarrhea: Secondary | ICD-10-CM | POA: Diagnosis not present

## 2017-08-10 DIAGNOSIS — F329 Major depressive disorder, single episode, unspecified: Secondary | ICD-10-CM | POA: Diagnosis not present

## 2017-08-10 DIAGNOSIS — F419 Anxiety disorder, unspecified: Secondary | ICD-10-CM | POA: Diagnosis not present

## 2017-08-10 DIAGNOSIS — M14679 Charcot's joint, unspecified ankle and foot: Secondary | ICD-10-CM | POA: Insufficient documentation

## 2017-08-10 DIAGNOSIS — K219 Gastro-esophageal reflux disease without esophagitis: Secondary | ICD-10-CM | POA: Diagnosis not present

## 2017-08-10 DIAGNOSIS — N529 Male erectile dysfunction, unspecified: Secondary | ICD-10-CM | POA: Diagnosis not present

## 2017-08-10 DIAGNOSIS — N400 Enlarged prostate without lower urinary tract symptoms: Secondary | ICD-10-CM | POA: Diagnosis not present

## 2017-08-10 DIAGNOSIS — R3129 Other microscopic hematuria: Secondary | ICD-10-CM | POA: Diagnosis not present

## 2017-08-10 DIAGNOSIS — M545 Low back pain: Secondary | ICD-10-CM | POA: Diagnosis not present

## 2017-09-29 DIAGNOSIS — N529 Male erectile dysfunction, unspecified: Secondary | ICD-10-CM | POA: Diagnosis not present

## 2017-09-29 DIAGNOSIS — F5101 Primary insomnia: Secondary | ICD-10-CM | POA: Diagnosis not present

## 2017-09-29 DIAGNOSIS — Z1331 Encounter for screening for depression: Secondary | ICD-10-CM | POA: Diagnosis not present

## 2017-09-29 DIAGNOSIS — J302 Other seasonal allergic rhinitis: Secondary | ICD-10-CM | POA: Diagnosis not present

## 2017-09-29 DIAGNOSIS — F419 Anxiety disorder, unspecified: Secondary | ICD-10-CM | POA: Diagnosis not present

## 2017-09-29 DIAGNOSIS — K219 Gastro-esophageal reflux disease without esophagitis: Secondary | ICD-10-CM | POA: Diagnosis not present

## 2017-09-29 DIAGNOSIS — Z1339 Encounter for screening examination for other mental health and behavioral disorders: Secondary | ICD-10-CM | POA: Diagnosis not present

## 2017-09-29 DIAGNOSIS — E7800 Pure hypercholesterolemia, unspecified: Secondary | ICD-10-CM | POA: Diagnosis not present

## 2017-09-29 DIAGNOSIS — E349 Endocrine disorder, unspecified: Secondary | ICD-10-CM | POA: Diagnosis not present

## 2017-09-29 DIAGNOSIS — F329 Major depressive disorder, single episode, unspecified: Secondary | ICD-10-CM | POA: Diagnosis not present

## 2017-09-29 DIAGNOSIS — K589 Irritable bowel syndrome without diarrhea: Secondary | ICD-10-CM | POA: Diagnosis not present

## 2017-09-29 DIAGNOSIS — M545 Low back pain: Secondary | ICD-10-CM | POA: Diagnosis not present

## 2017-10-12 DIAGNOSIS — H2513 Age-related nuclear cataract, bilateral: Secondary | ICD-10-CM | POA: Diagnosis not present

## 2017-10-12 DIAGNOSIS — H524 Presbyopia: Secondary | ICD-10-CM | POA: Diagnosis not present

## 2017-10-12 DIAGNOSIS — E119 Type 2 diabetes mellitus without complications: Secondary | ICD-10-CM | POA: Diagnosis not present

## 2017-10-12 DIAGNOSIS — H04123 Dry eye syndrome of bilateral lacrimal glands: Secondary | ICD-10-CM | POA: Diagnosis not present

## 2017-10-26 DIAGNOSIS — H2511 Age-related nuclear cataract, right eye: Secondary | ICD-10-CM | POA: Diagnosis not present

## 2017-10-26 DIAGNOSIS — H25811 Combined forms of age-related cataract, right eye: Secondary | ICD-10-CM | POA: Diagnosis not present

## 2017-10-26 DIAGNOSIS — Z01818 Encounter for other preprocedural examination: Secondary | ICD-10-CM | POA: Diagnosis not present

## 2017-11-01 DIAGNOSIS — K219 Gastro-esophageal reflux disease without esophagitis: Secondary | ICD-10-CM | POA: Diagnosis not present

## 2017-11-01 DIAGNOSIS — Z1331 Encounter for screening for depression: Secondary | ICD-10-CM | POA: Diagnosis not present

## 2017-11-01 DIAGNOSIS — J302 Other seasonal allergic rhinitis: Secondary | ICD-10-CM | POA: Diagnosis not present

## 2017-11-01 DIAGNOSIS — F419 Anxiety disorder, unspecified: Secondary | ICD-10-CM | POA: Diagnosis not present

## 2017-11-01 DIAGNOSIS — K589 Irritable bowel syndrome without diarrhea: Secondary | ICD-10-CM | POA: Diagnosis not present

## 2017-11-01 DIAGNOSIS — M545 Low back pain: Secondary | ICD-10-CM | POA: Diagnosis not present

## 2017-11-01 DIAGNOSIS — E7800 Pure hypercholesterolemia, unspecified: Secondary | ICD-10-CM | POA: Diagnosis not present

## 2017-11-01 DIAGNOSIS — F5101 Primary insomnia: Secondary | ICD-10-CM | POA: Diagnosis not present

## 2017-11-01 DIAGNOSIS — F329 Major depressive disorder, single episode, unspecified: Secondary | ICD-10-CM | POA: Diagnosis not present

## 2017-11-01 DIAGNOSIS — N529 Male erectile dysfunction, unspecified: Secondary | ICD-10-CM | POA: Diagnosis not present

## 2017-11-01 DIAGNOSIS — Z1339 Encounter for screening examination for other mental health and behavioral disorders: Secondary | ICD-10-CM | POA: Diagnosis not present

## 2017-11-01 DIAGNOSIS — E349 Endocrine disorder, unspecified: Secondary | ICD-10-CM | POA: Diagnosis not present

## 2017-11-02 DIAGNOSIS — H2512 Age-related nuclear cataract, left eye: Secondary | ICD-10-CM | POA: Diagnosis not present

## 2017-11-02 DIAGNOSIS — H25812 Combined forms of age-related cataract, left eye: Secondary | ICD-10-CM | POA: Diagnosis not present

## 2017-11-09 DIAGNOSIS — M79671 Pain in right foot: Secondary | ICD-10-CM | POA: Diagnosis not present

## 2017-11-09 DIAGNOSIS — M14671 Charcot's joint, right ankle and foot: Secondary | ICD-10-CM | POA: Diagnosis not present

## 2017-11-16 DIAGNOSIS — R3129 Other microscopic hematuria: Secondary | ICD-10-CM | POA: Diagnosis not present

## 2017-11-16 DIAGNOSIS — N400 Enlarged prostate without lower urinary tract symptoms: Secondary | ICD-10-CM | POA: Diagnosis not present

## 2017-11-16 DIAGNOSIS — F329 Major depressive disorder, single episode, unspecified: Secondary | ICD-10-CM | POA: Diagnosis not present

## 2017-11-16 DIAGNOSIS — K219 Gastro-esophageal reflux disease without esophagitis: Secondary | ICD-10-CM | POA: Diagnosis not present

## 2017-11-16 DIAGNOSIS — F5101 Primary insomnia: Secondary | ICD-10-CM | POA: Diagnosis not present

## 2017-11-16 DIAGNOSIS — F419 Anxiety disorder, unspecified: Secondary | ICD-10-CM | POA: Diagnosis not present

## 2017-11-16 DIAGNOSIS — J302 Other seasonal allergic rhinitis: Secondary | ICD-10-CM | POA: Diagnosis not present

## 2017-11-16 DIAGNOSIS — E7800 Pure hypercholesterolemia, unspecified: Secondary | ICD-10-CM | POA: Diagnosis not present

## 2017-11-16 DIAGNOSIS — Z0000 Encounter for general adult medical examination without abnormal findings: Secondary | ICD-10-CM | POA: Diagnosis not present

## 2017-11-16 DIAGNOSIS — E349 Endocrine disorder, unspecified: Secondary | ICD-10-CM | POA: Diagnosis not present

## 2017-11-16 DIAGNOSIS — Z125 Encounter for screening for malignant neoplasm of prostate: Secondary | ICD-10-CM | POA: Diagnosis not present

## 2017-11-16 DIAGNOSIS — N529 Male erectile dysfunction, unspecified: Secondary | ICD-10-CM | POA: Diagnosis not present

## 2017-11-16 DIAGNOSIS — E119 Type 2 diabetes mellitus without complications: Secondary | ICD-10-CM | POA: Diagnosis not present

## 2017-11-16 DIAGNOSIS — K589 Irritable bowel syndrome without diarrhea: Secondary | ICD-10-CM | POA: Diagnosis not present

## 2017-11-16 DIAGNOSIS — I10 Essential (primary) hypertension: Secondary | ICD-10-CM | POA: Diagnosis not present

## 2017-11-22 DIAGNOSIS — C44612 Basal cell carcinoma of skin of right upper limb, including shoulder: Secondary | ICD-10-CM | POA: Diagnosis not present

## 2017-11-22 DIAGNOSIS — L570 Actinic keratosis: Secondary | ICD-10-CM | POA: Diagnosis not present

## 2017-12-07 DIAGNOSIS — Z139 Encounter for screening, unspecified: Secondary | ICD-10-CM | POA: Diagnosis not present

## 2017-12-07 DIAGNOSIS — Z9181 History of falling: Secondary | ICD-10-CM | POA: Diagnosis not present

## 2017-12-07 DIAGNOSIS — F329 Major depressive disorder, single episode, unspecified: Secondary | ICD-10-CM | POA: Diagnosis not present

## 2017-12-07 DIAGNOSIS — F419 Anxiety disorder, unspecified: Secondary | ICD-10-CM | POA: Diagnosis not present

## 2017-12-07 DIAGNOSIS — N529 Male erectile dysfunction, unspecified: Secondary | ICD-10-CM | POA: Diagnosis not present

## 2017-12-07 DIAGNOSIS — Z125 Encounter for screening for malignant neoplasm of prostate: Secondary | ICD-10-CM | POA: Diagnosis not present

## 2017-12-07 DIAGNOSIS — M545 Low back pain: Secondary | ICD-10-CM | POA: Diagnosis not present

## 2017-12-07 DIAGNOSIS — E785 Hyperlipidemia, unspecified: Secondary | ICD-10-CM | POA: Diagnosis not present

## 2017-12-07 DIAGNOSIS — E7800 Pure hypercholesterolemia, unspecified: Secondary | ICD-10-CM | POA: Diagnosis not present

## 2017-12-07 DIAGNOSIS — Z0000 Encounter for general adult medical examination without abnormal findings: Secondary | ICD-10-CM | POA: Diagnosis not present

## 2017-12-07 DIAGNOSIS — K589 Irritable bowel syndrome without diarrhea: Secondary | ICD-10-CM | POA: Diagnosis not present

## 2018-01-10 DIAGNOSIS — J302 Other seasonal allergic rhinitis: Secondary | ICD-10-CM | POA: Diagnosis not present

## 2018-01-10 DIAGNOSIS — K219 Gastro-esophageal reflux disease without esophagitis: Secondary | ICD-10-CM | POA: Diagnosis not present

## 2018-01-10 DIAGNOSIS — K589 Irritable bowel syndrome without diarrhea: Secondary | ICD-10-CM | POA: Diagnosis not present

## 2018-01-10 DIAGNOSIS — M545 Low back pain: Secondary | ICD-10-CM | POA: Diagnosis not present

## 2018-01-10 DIAGNOSIS — F5101 Primary insomnia: Secondary | ICD-10-CM | POA: Diagnosis not present

## 2018-01-10 DIAGNOSIS — N529 Male erectile dysfunction, unspecified: Secondary | ICD-10-CM | POA: Diagnosis not present

## 2018-01-10 DIAGNOSIS — F419 Anxiety disorder, unspecified: Secondary | ICD-10-CM | POA: Diagnosis not present

## 2018-01-10 DIAGNOSIS — N400 Enlarged prostate without lower urinary tract symptoms: Secondary | ICD-10-CM | POA: Diagnosis not present

## 2018-01-10 DIAGNOSIS — E7800 Pure hypercholesterolemia, unspecified: Secondary | ICD-10-CM | POA: Diagnosis not present

## 2018-01-10 DIAGNOSIS — E349 Endocrine disorder, unspecified: Secondary | ICD-10-CM | POA: Diagnosis not present

## 2018-01-10 DIAGNOSIS — Z23 Encounter for immunization: Secondary | ICD-10-CM | POA: Diagnosis not present

## 2018-01-10 DIAGNOSIS — F329 Major depressive disorder, single episode, unspecified: Secondary | ICD-10-CM | POA: Diagnosis not present

## 2018-02-08 DIAGNOSIS — I10 Essential (primary) hypertension: Secondary | ICD-10-CM | POA: Diagnosis not present

## 2018-02-08 DIAGNOSIS — M47894 Other spondylosis, thoracic region: Secondary | ICD-10-CM | POA: Diagnosis not present

## 2018-02-08 DIAGNOSIS — M546 Pain in thoracic spine: Secondary | ICD-10-CM | POA: Insufficient documentation

## 2018-02-11 DIAGNOSIS — M545 Low back pain: Secondary | ICD-10-CM | POA: Diagnosis not present

## 2018-02-11 DIAGNOSIS — N529 Male erectile dysfunction, unspecified: Secondary | ICD-10-CM | POA: Diagnosis not present

## 2018-02-11 DIAGNOSIS — J302 Other seasonal allergic rhinitis: Secondary | ICD-10-CM | POA: Diagnosis not present

## 2018-02-11 DIAGNOSIS — F5101 Primary insomnia: Secondary | ICD-10-CM | POA: Diagnosis not present

## 2018-02-11 DIAGNOSIS — N400 Enlarged prostate without lower urinary tract symptoms: Secondary | ICD-10-CM | POA: Diagnosis not present

## 2018-02-11 DIAGNOSIS — E349 Endocrine disorder, unspecified: Secondary | ICD-10-CM | POA: Diagnosis not present

## 2018-02-11 DIAGNOSIS — F3342 Major depressive disorder, recurrent, in full remission: Secondary | ICD-10-CM | POA: Diagnosis not present

## 2018-02-11 DIAGNOSIS — R3129 Other microscopic hematuria: Secondary | ICD-10-CM | POA: Diagnosis not present

## 2018-02-11 DIAGNOSIS — K219 Gastro-esophageal reflux disease without esophagitis: Secondary | ICD-10-CM | POA: Diagnosis not present

## 2018-02-11 DIAGNOSIS — F419 Anxiety disorder, unspecified: Secondary | ICD-10-CM | POA: Diagnosis not present

## 2018-02-11 DIAGNOSIS — K589 Irritable bowel syndrome without diarrhea: Secondary | ICD-10-CM | POA: Diagnosis not present

## 2018-02-11 DIAGNOSIS — E7800 Pure hypercholesterolemia, unspecified: Secondary | ICD-10-CM | POA: Diagnosis not present

## 2018-02-14 DIAGNOSIS — D2239 Melanocytic nevi of other parts of face: Secondary | ICD-10-CM | POA: Diagnosis not present

## 2018-02-14 DIAGNOSIS — C44622 Squamous cell carcinoma of skin of right upper limb, including shoulder: Secondary | ICD-10-CM | POA: Diagnosis not present

## 2018-02-14 DIAGNOSIS — C44319 Basal cell carcinoma of skin of other parts of face: Secondary | ICD-10-CM | POA: Diagnosis not present

## 2018-02-28 DIAGNOSIS — S61411A Laceration without foreign body of right hand, initial encounter: Secondary | ICD-10-CM | POA: Diagnosis not present

## 2018-02-28 DIAGNOSIS — Z23 Encounter for immunization: Secondary | ICD-10-CM | POA: Diagnosis not present

## 2018-02-28 DIAGNOSIS — F3342 Major depressive disorder, recurrent, in full remission: Secondary | ICD-10-CM | POA: Diagnosis not present

## 2018-02-28 DIAGNOSIS — F419 Anxiety disorder, unspecified: Secondary | ICD-10-CM | POA: Diagnosis not present

## 2018-02-28 DIAGNOSIS — R3129 Other microscopic hematuria: Secondary | ICD-10-CM | POA: Diagnosis not present

## 2018-02-28 DIAGNOSIS — K589 Irritable bowel syndrome without diarrhea: Secondary | ICD-10-CM | POA: Diagnosis not present

## 2018-02-28 DIAGNOSIS — F5101 Primary insomnia: Secondary | ICD-10-CM | POA: Diagnosis not present

## 2018-02-28 DIAGNOSIS — K219 Gastro-esophageal reflux disease without esophagitis: Secondary | ICD-10-CM | POA: Diagnosis not present

## 2018-02-28 DIAGNOSIS — N529 Male erectile dysfunction, unspecified: Secondary | ICD-10-CM | POA: Diagnosis not present

## 2018-02-28 DIAGNOSIS — N400 Enlarged prostate without lower urinary tract symptoms: Secondary | ICD-10-CM | POA: Diagnosis not present

## 2018-02-28 DIAGNOSIS — E7800 Pure hypercholesterolemia, unspecified: Secondary | ICD-10-CM | POA: Diagnosis not present

## 2018-02-28 DIAGNOSIS — J302 Other seasonal allergic rhinitis: Secondary | ICD-10-CM | POA: Diagnosis not present

## 2018-03-07 DIAGNOSIS — M5124 Other intervertebral disc displacement, thoracic region: Secondary | ICD-10-CM | POA: Diagnosis not present

## 2018-03-07 DIAGNOSIS — M9902 Segmental and somatic dysfunction of thoracic region: Secondary | ICD-10-CM | POA: Diagnosis not present

## 2018-03-13 DIAGNOSIS — G609 Hereditary and idiopathic neuropathy, unspecified: Secondary | ICD-10-CM | POA: Insufficient documentation

## 2018-03-13 DIAGNOSIS — M14671 Charcot's joint, right ankle and foot: Secondary | ICD-10-CM | POA: Diagnosis not present

## 2018-03-15 DIAGNOSIS — M9902 Segmental and somatic dysfunction of thoracic region: Secondary | ICD-10-CM | POA: Diagnosis not present

## 2018-03-15 DIAGNOSIS — M5124 Other intervertebral disc displacement, thoracic region: Secondary | ICD-10-CM | POA: Diagnosis not present

## 2018-03-17 DIAGNOSIS — M9902 Segmental and somatic dysfunction of thoracic region: Secondary | ICD-10-CM | POA: Diagnosis not present

## 2018-03-17 DIAGNOSIS — M5124 Other intervertebral disc displacement, thoracic region: Secondary | ICD-10-CM | POA: Diagnosis not present

## 2018-03-18 DIAGNOSIS — M545 Low back pain: Secondary | ICD-10-CM | POA: Diagnosis not present

## 2018-03-18 DIAGNOSIS — E349 Endocrine disorder, unspecified: Secondary | ICD-10-CM | POA: Diagnosis not present

## 2018-03-18 DIAGNOSIS — N400 Enlarged prostate without lower urinary tract symptoms: Secondary | ICD-10-CM | POA: Diagnosis not present

## 2018-03-18 DIAGNOSIS — I10 Essential (primary) hypertension: Secondary | ICD-10-CM | POA: Diagnosis not present

## 2018-03-18 DIAGNOSIS — J302 Other seasonal allergic rhinitis: Secondary | ICD-10-CM | POA: Diagnosis not present

## 2018-03-18 DIAGNOSIS — F419 Anxiety disorder, unspecified: Secondary | ICD-10-CM | POA: Diagnosis not present

## 2018-03-18 DIAGNOSIS — F3342 Major depressive disorder, recurrent, in full remission: Secondary | ICD-10-CM | POA: Diagnosis not present

## 2018-03-18 DIAGNOSIS — N529 Male erectile dysfunction, unspecified: Secondary | ICD-10-CM | POA: Diagnosis not present

## 2018-03-18 DIAGNOSIS — K589 Irritable bowel syndrome without diarrhea: Secondary | ICD-10-CM | POA: Diagnosis not present

## 2018-03-18 DIAGNOSIS — K219 Gastro-esophageal reflux disease without esophagitis: Secondary | ICD-10-CM | POA: Diagnosis not present

## 2018-03-18 DIAGNOSIS — J208 Acute bronchitis due to other specified organisms: Secondary | ICD-10-CM | POA: Diagnosis not present

## 2018-03-18 DIAGNOSIS — F5101 Primary insomnia: Secondary | ICD-10-CM | POA: Diagnosis not present

## 2018-03-18 DIAGNOSIS — E118 Type 2 diabetes mellitus with unspecified complications: Secondary | ICD-10-CM | POA: Diagnosis not present

## 2018-03-18 DIAGNOSIS — E7800 Pure hypercholesterolemia, unspecified: Secondary | ICD-10-CM | POA: Diagnosis not present

## 2018-03-20 DIAGNOSIS — M9902 Segmental and somatic dysfunction of thoracic region: Secondary | ICD-10-CM | POA: Diagnosis not present

## 2018-03-20 DIAGNOSIS — M5124 Other intervertebral disc displacement, thoracic region: Secondary | ICD-10-CM | POA: Diagnosis not present

## 2018-03-22 DIAGNOSIS — M9902 Segmental and somatic dysfunction of thoracic region: Secondary | ICD-10-CM | POA: Diagnosis not present

## 2018-03-22 DIAGNOSIS — M5124 Other intervertebral disc displacement, thoracic region: Secondary | ICD-10-CM | POA: Diagnosis not present

## 2018-03-23 DIAGNOSIS — G609 Hereditary and idiopathic neuropathy, unspecified: Secondary | ICD-10-CM | POA: Diagnosis not present

## 2018-03-23 DIAGNOSIS — M14671 Charcot's joint, right ankle and foot: Secondary | ICD-10-CM | POA: Diagnosis not present

## 2018-04-03 DIAGNOSIS — M5124 Other intervertebral disc displacement, thoracic region: Secondary | ICD-10-CM | POA: Diagnosis not present

## 2018-04-03 DIAGNOSIS — M9902 Segmental and somatic dysfunction of thoracic region: Secondary | ICD-10-CM | POA: Diagnosis not present

## 2018-04-06 DIAGNOSIS — N529 Male erectile dysfunction, unspecified: Secondary | ICD-10-CM | POA: Diagnosis not present

## 2018-04-06 DIAGNOSIS — J302 Other seasonal allergic rhinitis: Secondary | ICD-10-CM | POA: Diagnosis not present

## 2018-04-06 DIAGNOSIS — F419 Anxiety disorder, unspecified: Secondary | ICD-10-CM | POA: Diagnosis not present

## 2018-04-06 DIAGNOSIS — E349 Endocrine disorder, unspecified: Secondary | ICD-10-CM | POA: Diagnosis not present

## 2018-04-06 DIAGNOSIS — M545 Low back pain: Secondary | ICD-10-CM | POA: Diagnosis not present

## 2018-04-06 DIAGNOSIS — M25561 Pain in right knee: Secondary | ICD-10-CM | POA: Diagnosis not present

## 2018-04-06 DIAGNOSIS — F5101 Primary insomnia: Secondary | ICD-10-CM | POA: Diagnosis not present

## 2018-04-06 DIAGNOSIS — N400 Enlarged prostate without lower urinary tract symptoms: Secondary | ICD-10-CM | POA: Diagnosis not present

## 2018-04-06 DIAGNOSIS — K589 Irritable bowel syndrome without diarrhea: Secondary | ICD-10-CM | POA: Diagnosis not present

## 2018-04-06 DIAGNOSIS — K219 Gastro-esophageal reflux disease without esophagitis: Secondary | ICD-10-CM | POA: Diagnosis not present

## 2018-04-06 DIAGNOSIS — E7800 Pure hypercholesterolemia, unspecified: Secondary | ICD-10-CM | POA: Diagnosis not present

## 2018-04-06 DIAGNOSIS — F3342 Major depressive disorder, recurrent, in full remission: Secondary | ICD-10-CM | POA: Diagnosis not present

## 2018-04-14 DIAGNOSIS — M5124 Other intervertebral disc displacement, thoracic region: Secondary | ICD-10-CM | POA: Diagnosis not present

## 2018-04-14 DIAGNOSIS — M9902 Segmental and somatic dysfunction of thoracic region: Secondary | ICD-10-CM | POA: Diagnosis not present

## 2018-04-20 DIAGNOSIS — F419 Anxiety disorder, unspecified: Secondary | ICD-10-CM | POA: Diagnosis not present

## 2018-04-20 DIAGNOSIS — F3342 Major depressive disorder, recurrent, in full remission: Secondary | ICD-10-CM | POA: Diagnosis not present

## 2018-04-20 DIAGNOSIS — E7800 Pure hypercholesterolemia, unspecified: Secondary | ICD-10-CM | POA: Diagnosis not present

## 2018-04-20 DIAGNOSIS — F5101 Primary insomnia: Secondary | ICD-10-CM | POA: Diagnosis not present

## 2018-04-20 DIAGNOSIS — K589 Irritable bowel syndrome without diarrhea: Secondary | ICD-10-CM | POA: Diagnosis not present

## 2018-04-20 DIAGNOSIS — J302 Other seasonal allergic rhinitis: Secondary | ICD-10-CM | POA: Diagnosis not present

## 2018-04-20 DIAGNOSIS — E349 Endocrine disorder, unspecified: Secondary | ICD-10-CM | POA: Diagnosis not present

## 2018-04-20 DIAGNOSIS — Z6825 Body mass index (BMI) 25.0-25.9, adult: Secondary | ICD-10-CM | POA: Diagnosis not present

## 2018-04-20 DIAGNOSIS — M545 Low back pain: Secondary | ICD-10-CM | POA: Diagnosis not present

## 2018-04-20 DIAGNOSIS — N400 Enlarged prostate without lower urinary tract symptoms: Secondary | ICD-10-CM | POA: Diagnosis not present

## 2018-04-20 DIAGNOSIS — M5124 Other intervertebral disc displacement, thoracic region: Secondary | ICD-10-CM | POA: Diagnosis not present

## 2018-04-20 DIAGNOSIS — M9902 Segmental and somatic dysfunction of thoracic region: Secondary | ICD-10-CM | POA: Diagnosis not present

## 2018-04-20 DIAGNOSIS — N529 Male erectile dysfunction, unspecified: Secondary | ICD-10-CM | POA: Diagnosis not present

## 2018-04-20 DIAGNOSIS — K219 Gastro-esophageal reflux disease without esophagitis: Secondary | ICD-10-CM | POA: Diagnosis not present

## 2018-05-11 DIAGNOSIS — R030 Elevated blood-pressure reading, without diagnosis of hypertension: Secondary | ICD-10-CM | POA: Diagnosis not present

## 2018-05-11 DIAGNOSIS — M4804 Spinal stenosis, thoracic region: Secondary | ICD-10-CM | POA: Diagnosis not present

## 2018-05-11 DIAGNOSIS — M48062 Spinal stenosis, lumbar region with neurogenic claudication: Secondary | ICD-10-CM | POA: Diagnosis not present

## 2018-05-11 DIAGNOSIS — M47814 Spondylosis without myelopathy or radiculopathy, thoracic region: Secondary | ICD-10-CM | POA: Diagnosis not present

## 2018-05-18 DIAGNOSIS — F3342 Major depressive disorder, recurrent, in full remission: Secondary | ICD-10-CM | POA: Diagnosis not present

## 2018-05-18 DIAGNOSIS — N400 Enlarged prostate without lower urinary tract symptoms: Secondary | ICD-10-CM | POA: Diagnosis not present

## 2018-05-18 DIAGNOSIS — K589 Irritable bowel syndrome without diarrhea: Secondary | ICD-10-CM | POA: Diagnosis not present

## 2018-05-18 DIAGNOSIS — K219 Gastro-esophageal reflux disease without esophagitis: Secondary | ICD-10-CM | POA: Diagnosis not present

## 2018-05-18 DIAGNOSIS — F5101 Primary insomnia: Secondary | ICD-10-CM | POA: Diagnosis not present

## 2018-05-18 DIAGNOSIS — M545 Low back pain: Secondary | ICD-10-CM | POA: Diagnosis not present

## 2018-05-18 DIAGNOSIS — N529 Male erectile dysfunction, unspecified: Secondary | ICD-10-CM | POA: Diagnosis not present

## 2018-05-18 DIAGNOSIS — F419 Anxiety disorder, unspecified: Secondary | ICD-10-CM | POA: Diagnosis not present

## 2018-05-18 DIAGNOSIS — R3129 Other microscopic hematuria: Secondary | ICD-10-CM | POA: Diagnosis not present

## 2018-05-18 DIAGNOSIS — J302 Other seasonal allergic rhinitis: Secondary | ICD-10-CM | POA: Diagnosis not present

## 2018-05-18 DIAGNOSIS — E7800 Pure hypercholesterolemia, unspecified: Secondary | ICD-10-CM | POA: Diagnosis not present

## 2018-05-18 DIAGNOSIS — E118 Type 2 diabetes mellitus with unspecified complications: Secondary | ICD-10-CM | POA: Diagnosis not present

## 2018-05-22 DIAGNOSIS — E349 Endocrine disorder, unspecified: Secondary | ICD-10-CM | POA: Diagnosis not present

## 2018-05-23 DIAGNOSIS — M5414 Radiculopathy, thoracic region: Secondary | ICD-10-CM | POA: Diagnosis not present

## 2018-05-23 DIAGNOSIS — M5114 Intervertebral disc disorders with radiculopathy, thoracic region: Secondary | ICD-10-CM | POA: Diagnosis not present

## 2018-05-30 DIAGNOSIS — F5101 Primary insomnia: Secondary | ICD-10-CM | POA: Diagnosis not present

## 2018-05-30 DIAGNOSIS — K219 Gastro-esophageal reflux disease without esophagitis: Secondary | ICD-10-CM | POA: Diagnosis not present

## 2018-05-30 DIAGNOSIS — J302 Other seasonal allergic rhinitis: Secondary | ICD-10-CM | POA: Diagnosis not present

## 2018-05-30 DIAGNOSIS — N400 Enlarged prostate without lower urinary tract symptoms: Secondary | ICD-10-CM | POA: Diagnosis not present

## 2018-05-30 DIAGNOSIS — E7800 Pure hypercholesterolemia, unspecified: Secondary | ICD-10-CM | POA: Diagnosis not present

## 2018-05-30 DIAGNOSIS — R3129 Other microscopic hematuria: Secondary | ICD-10-CM | POA: Diagnosis not present

## 2018-05-30 DIAGNOSIS — N529 Male erectile dysfunction, unspecified: Secondary | ICD-10-CM | POA: Diagnosis not present

## 2018-05-30 DIAGNOSIS — F419 Anxiety disorder, unspecified: Secondary | ICD-10-CM | POA: Diagnosis not present

## 2018-05-30 DIAGNOSIS — F3342 Major depressive disorder, recurrent, in full remission: Secondary | ICD-10-CM | POA: Diagnosis not present

## 2018-05-30 DIAGNOSIS — E118 Type 2 diabetes mellitus with unspecified complications: Secondary | ICD-10-CM | POA: Diagnosis not present

## 2018-05-30 DIAGNOSIS — K589 Irritable bowel syndrome without diarrhea: Secondary | ICD-10-CM | POA: Diagnosis not present

## 2018-05-30 DIAGNOSIS — J0100 Acute maxillary sinusitis, unspecified: Secondary | ICD-10-CM | POA: Diagnosis not present

## 2018-06-08 DIAGNOSIS — L570 Actinic keratosis: Secondary | ICD-10-CM | POA: Diagnosis not present

## 2018-06-08 DIAGNOSIS — D235 Other benign neoplasm of skin of trunk: Secondary | ICD-10-CM | POA: Diagnosis not present

## 2018-06-26 DIAGNOSIS — E7800 Pure hypercholesterolemia, unspecified: Secondary | ICD-10-CM | POA: Diagnosis not present

## 2018-06-26 DIAGNOSIS — M545 Low back pain: Secondary | ICD-10-CM | POA: Diagnosis not present

## 2018-06-26 DIAGNOSIS — K589 Irritable bowel syndrome without diarrhea: Secondary | ICD-10-CM | POA: Diagnosis not present

## 2018-06-26 DIAGNOSIS — F419 Anxiety disorder, unspecified: Secondary | ICD-10-CM | POA: Diagnosis not present

## 2018-06-26 DIAGNOSIS — I10 Essential (primary) hypertension: Secondary | ICD-10-CM | POA: Diagnosis not present

## 2018-06-26 DIAGNOSIS — E118 Type 2 diabetes mellitus with unspecified complications: Secondary | ICD-10-CM | POA: Diagnosis not present

## 2018-06-26 DIAGNOSIS — N400 Enlarged prostate without lower urinary tract symptoms: Secondary | ICD-10-CM | POA: Diagnosis not present

## 2018-06-26 DIAGNOSIS — R3129 Other microscopic hematuria: Secondary | ICD-10-CM | POA: Diagnosis not present

## 2018-06-26 DIAGNOSIS — N529 Male erectile dysfunction, unspecified: Secondary | ICD-10-CM | POA: Diagnosis not present

## 2018-06-26 DIAGNOSIS — J302 Other seasonal allergic rhinitis: Secondary | ICD-10-CM | POA: Diagnosis not present

## 2018-06-26 DIAGNOSIS — K219 Gastro-esophageal reflux disease without esophagitis: Secondary | ICD-10-CM | POA: Diagnosis not present

## 2018-06-26 DIAGNOSIS — F3342 Major depressive disorder, recurrent, in full remission: Secondary | ICD-10-CM | POA: Diagnosis not present

## 2018-06-26 DIAGNOSIS — F5101 Primary insomnia: Secondary | ICD-10-CM | POA: Diagnosis not present

## 2018-06-26 DIAGNOSIS — E349 Endocrine disorder, unspecified: Secondary | ICD-10-CM | POA: Diagnosis not present

## 2018-06-27 DIAGNOSIS — K589 Irritable bowel syndrome without diarrhea: Secondary | ICD-10-CM | POA: Diagnosis not present

## 2018-06-27 DIAGNOSIS — F5101 Primary insomnia: Secondary | ICD-10-CM | POA: Diagnosis not present

## 2018-06-27 DIAGNOSIS — E7800 Pure hypercholesterolemia, unspecified: Secondary | ICD-10-CM | POA: Diagnosis not present

## 2018-06-27 DIAGNOSIS — K219 Gastro-esophageal reflux disease without esophagitis: Secondary | ICD-10-CM | POA: Diagnosis not present

## 2018-06-27 DIAGNOSIS — R3129 Other microscopic hematuria: Secondary | ICD-10-CM | POA: Diagnosis not present

## 2018-06-27 DIAGNOSIS — N529 Male erectile dysfunction, unspecified: Secondary | ICD-10-CM | POA: Diagnosis not present

## 2018-06-27 DIAGNOSIS — N400 Enlarged prostate without lower urinary tract symptoms: Secondary | ICD-10-CM | POA: Diagnosis not present

## 2018-06-27 DIAGNOSIS — Z6825 Body mass index (BMI) 25.0-25.9, adult: Secondary | ICD-10-CM | POA: Diagnosis not present

## 2018-06-27 DIAGNOSIS — F3342 Major depressive disorder, recurrent, in full remission: Secondary | ICD-10-CM | POA: Diagnosis not present

## 2018-06-27 DIAGNOSIS — J069 Acute upper respiratory infection, unspecified: Secondary | ICD-10-CM | POA: Diagnosis not present

## 2018-06-27 DIAGNOSIS — J302 Other seasonal allergic rhinitis: Secondary | ICD-10-CM | POA: Diagnosis not present

## 2018-06-27 DIAGNOSIS — F419 Anxiety disorder, unspecified: Secondary | ICD-10-CM | POA: Diagnosis not present

## 2018-07-10 DIAGNOSIS — R222 Localized swelling, mass and lump, trunk: Secondary | ICD-10-CM | POA: Insufficient documentation

## 2018-07-19 DIAGNOSIS — E349 Endocrine disorder, unspecified: Secondary | ICD-10-CM | POA: Diagnosis not present

## 2018-07-19 DIAGNOSIS — R918 Other nonspecific abnormal finding of lung field: Secondary | ICD-10-CM | POA: Diagnosis not present

## 2018-07-19 DIAGNOSIS — Z8551 Personal history of malignant neoplasm of bladder: Secondary | ICD-10-CM | POA: Diagnosis not present

## 2018-07-19 DIAGNOSIS — F5101 Primary insomnia: Secondary | ICD-10-CM | POA: Diagnosis not present

## 2018-07-19 DIAGNOSIS — F419 Anxiety disorder, unspecified: Secondary | ICD-10-CM | POA: Diagnosis not present

## 2018-07-19 DIAGNOSIS — J432 Centrilobular emphysema: Secondary | ICD-10-CM | POA: Diagnosis not present

## 2018-07-19 DIAGNOSIS — K589 Irritable bowel syndrome without diarrhea: Secondary | ICD-10-CM | POA: Diagnosis not present

## 2018-07-19 DIAGNOSIS — R3129 Other microscopic hematuria: Secondary | ICD-10-CM | POA: Diagnosis not present

## 2018-07-19 DIAGNOSIS — M545 Low back pain: Secondary | ICD-10-CM | POA: Diagnosis not present

## 2018-07-19 DIAGNOSIS — E7800 Pure hypercholesterolemia, unspecified: Secondary | ICD-10-CM | POA: Diagnosis not present

## 2018-07-19 DIAGNOSIS — N529 Male erectile dysfunction, unspecified: Secondary | ICD-10-CM | POA: Diagnosis not present

## 2018-07-19 DIAGNOSIS — F3342 Major depressive disorder, recurrent, in full remission: Secondary | ICD-10-CM | POA: Diagnosis not present

## 2018-07-19 DIAGNOSIS — K219 Gastro-esophageal reflux disease without esophagitis: Secondary | ICD-10-CM | POA: Diagnosis not present

## 2018-07-19 DIAGNOSIS — N400 Enlarged prostate without lower urinary tract symptoms: Secondary | ICD-10-CM | POA: Diagnosis not present

## 2018-07-19 DIAGNOSIS — N200 Calculus of kidney: Secondary | ICD-10-CM | POA: Diagnosis not present

## 2018-07-19 DIAGNOSIS — K8020 Calculus of gallbladder without cholecystitis without obstruction: Secondary | ICD-10-CM | POA: Diagnosis not present

## 2018-07-19 DIAGNOSIS — J302 Other seasonal allergic rhinitis: Secondary | ICD-10-CM | POA: Diagnosis not present

## 2018-07-28 DIAGNOSIS — F3342 Major depressive disorder, recurrent, in full remission: Secondary | ICD-10-CM | POA: Diagnosis not present

## 2018-07-28 DIAGNOSIS — E349 Endocrine disorder, unspecified: Secondary | ICD-10-CM | POA: Diagnosis not present

## 2018-07-28 DIAGNOSIS — Z6824 Body mass index (BMI) 24.0-24.9, adult: Secondary | ICD-10-CM | POA: Diagnosis not present

## 2018-07-28 DIAGNOSIS — K589 Irritable bowel syndrome without diarrhea: Secondary | ICD-10-CM | POA: Diagnosis not present

## 2018-07-28 DIAGNOSIS — E7800 Pure hypercholesterolemia, unspecified: Secondary | ICD-10-CM | POA: Diagnosis not present

## 2018-07-28 DIAGNOSIS — K219 Gastro-esophageal reflux disease without esophagitis: Secondary | ICD-10-CM | POA: Diagnosis not present

## 2018-07-28 DIAGNOSIS — N529 Male erectile dysfunction, unspecified: Secondary | ICD-10-CM | POA: Diagnosis not present

## 2018-07-28 DIAGNOSIS — M545 Low back pain: Secondary | ICD-10-CM | POA: Diagnosis not present

## 2018-07-28 DIAGNOSIS — F5101 Primary insomnia: Secondary | ICD-10-CM | POA: Diagnosis not present

## 2018-07-28 DIAGNOSIS — J302 Other seasonal allergic rhinitis: Secondary | ICD-10-CM | POA: Diagnosis not present

## 2018-07-28 DIAGNOSIS — F419 Anxiety disorder, unspecified: Secondary | ICD-10-CM | POA: Diagnosis not present

## 2018-08-25 DIAGNOSIS — E349 Endocrine disorder, unspecified: Secondary | ICD-10-CM | POA: Diagnosis not present

## 2018-08-25 DIAGNOSIS — J302 Other seasonal allergic rhinitis: Secondary | ICD-10-CM | POA: Diagnosis not present

## 2018-08-25 DIAGNOSIS — F5101 Primary insomnia: Secondary | ICD-10-CM | POA: Diagnosis not present

## 2018-08-25 DIAGNOSIS — K219 Gastro-esophageal reflux disease without esophagitis: Secondary | ICD-10-CM | POA: Diagnosis not present

## 2018-08-25 DIAGNOSIS — F3342 Major depressive disorder, recurrent, in full remission: Secondary | ICD-10-CM | POA: Diagnosis not present

## 2018-08-25 DIAGNOSIS — N529 Male erectile dysfunction, unspecified: Secondary | ICD-10-CM | POA: Diagnosis not present

## 2018-08-25 DIAGNOSIS — M545 Low back pain: Secondary | ICD-10-CM | POA: Diagnosis not present

## 2018-08-25 DIAGNOSIS — F419 Anxiety disorder, unspecified: Secondary | ICD-10-CM | POA: Diagnosis not present

## 2018-08-25 DIAGNOSIS — Z6825 Body mass index (BMI) 25.0-25.9, adult: Secondary | ICD-10-CM | POA: Diagnosis not present

## 2018-08-25 DIAGNOSIS — K589 Irritable bowel syndrome without diarrhea: Secondary | ICD-10-CM | POA: Diagnosis not present

## 2018-08-25 DIAGNOSIS — E7800 Pure hypercholesterolemia, unspecified: Secondary | ICD-10-CM | POA: Diagnosis not present

## 2018-09-13 DIAGNOSIS — K047 Periapical abscess without sinus: Secondary | ICD-10-CM | POA: Diagnosis not present

## 2018-09-13 DIAGNOSIS — N529 Male erectile dysfunction, unspecified: Secondary | ICD-10-CM | POA: Diagnosis not present

## 2018-09-13 DIAGNOSIS — N400 Enlarged prostate without lower urinary tract symptoms: Secondary | ICD-10-CM | POA: Diagnosis not present

## 2018-09-13 DIAGNOSIS — F419 Anxiety disorder, unspecified: Secondary | ICD-10-CM | POA: Diagnosis not present

## 2018-09-13 DIAGNOSIS — E7800 Pure hypercholesterolemia, unspecified: Secondary | ICD-10-CM | POA: Diagnosis not present

## 2018-09-13 DIAGNOSIS — F5101 Primary insomnia: Secondary | ICD-10-CM | POA: Diagnosis not present

## 2018-09-13 DIAGNOSIS — J302 Other seasonal allergic rhinitis: Secondary | ICD-10-CM | POA: Diagnosis not present

## 2018-09-13 DIAGNOSIS — R3129 Other microscopic hematuria: Secondary | ICD-10-CM | POA: Diagnosis not present

## 2018-09-13 DIAGNOSIS — I10 Essential (primary) hypertension: Secondary | ICD-10-CM | POA: Diagnosis not present

## 2018-09-13 DIAGNOSIS — K219 Gastro-esophageal reflux disease without esophagitis: Secondary | ICD-10-CM | POA: Diagnosis not present

## 2018-09-13 DIAGNOSIS — E118 Type 2 diabetes mellitus with unspecified complications: Secondary | ICD-10-CM | POA: Diagnosis not present

## 2018-09-13 DIAGNOSIS — F3342 Major depressive disorder, recurrent, in full remission: Secondary | ICD-10-CM | POA: Diagnosis not present

## 2018-09-20 DIAGNOSIS — D2239 Melanocytic nevi of other parts of face: Secondary | ICD-10-CM | POA: Diagnosis not present

## 2018-09-20 DIAGNOSIS — C44319 Basal cell carcinoma of skin of other parts of face: Secondary | ICD-10-CM | POA: Diagnosis not present

## 2018-09-20 DIAGNOSIS — L814 Other melanin hyperpigmentation: Secondary | ICD-10-CM | POA: Diagnosis not present

## 2018-09-20 DIAGNOSIS — L570 Actinic keratosis: Secondary | ICD-10-CM | POA: Diagnosis not present

## 2018-09-20 DIAGNOSIS — C44519 Basal cell carcinoma of skin of other part of trunk: Secondary | ICD-10-CM | POA: Diagnosis not present

## 2018-09-22 DIAGNOSIS — E7800 Pure hypercholesterolemia, unspecified: Secondary | ICD-10-CM | POA: Diagnosis not present

## 2018-09-22 DIAGNOSIS — E349 Endocrine disorder, unspecified: Secondary | ICD-10-CM | POA: Diagnosis not present

## 2018-09-22 DIAGNOSIS — R3129 Other microscopic hematuria: Secondary | ICD-10-CM | POA: Diagnosis not present

## 2018-09-22 DIAGNOSIS — N529 Male erectile dysfunction, unspecified: Secondary | ICD-10-CM | POA: Diagnosis not present

## 2018-09-22 DIAGNOSIS — F5101 Primary insomnia: Secondary | ICD-10-CM | POA: Diagnosis not present

## 2018-09-22 DIAGNOSIS — F3342 Major depressive disorder, recurrent, in full remission: Secondary | ICD-10-CM | POA: Diagnosis not present

## 2018-09-22 DIAGNOSIS — K219 Gastro-esophageal reflux disease without esophagitis: Secondary | ICD-10-CM | POA: Diagnosis not present

## 2018-09-22 DIAGNOSIS — J302 Other seasonal allergic rhinitis: Secondary | ICD-10-CM | POA: Diagnosis not present

## 2018-09-22 DIAGNOSIS — F419 Anxiety disorder, unspecified: Secondary | ICD-10-CM | POA: Diagnosis not present

## 2018-09-22 DIAGNOSIS — N400 Enlarged prostate without lower urinary tract symptoms: Secondary | ICD-10-CM | POA: Diagnosis not present

## 2018-09-22 DIAGNOSIS — I10 Essential (primary) hypertension: Secondary | ICD-10-CM | POA: Diagnosis not present

## 2018-09-22 DIAGNOSIS — E118 Type 2 diabetes mellitus with unspecified complications: Secondary | ICD-10-CM | POA: Diagnosis not present

## 2018-09-27 DIAGNOSIS — H43811 Vitreous degeneration, right eye: Secondary | ICD-10-CM | POA: Diagnosis not present

## 2018-10-05 DIAGNOSIS — D045 Carcinoma in situ of skin of trunk: Secondary | ICD-10-CM | POA: Diagnosis not present

## 2018-10-24 DIAGNOSIS — F3342 Major depressive disorder, recurrent, in full remission: Secondary | ICD-10-CM | POA: Diagnosis not present

## 2018-10-24 DIAGNOSIS — N529 Male erectile dysfunction, unspecified: Secondary | ICD-10-CM | POA: Diagnosis not present

## 2018-10-24 DIAGNOSIS — F5101 Primary insomnia: Secondary | ICD-10-CM | POA: Diagnosis not present

## 2018-10-24 DIAGNOSIS — E7800 Pure hypercholesterolemia, unspecified: Secondary | ICD-10-CM | POA: Diagnosis not present

## 2018-10-24 DIAGNOSIS — J302 Other seasonal allergic rhinitis: Secondary | ICD-10-CM | POA: Diagnosis not present

## 2018-10-24 DIAGNOSIS — E118 Type 2 diabetes mellitus with unspecified complications: Secondary | ICD-10-CM | POA: Diagnosis not present

## 2018-10-24 DIAGNOSIS — F419 Anxiety disorder, unspecified: Secondary | ICD-10-CM | POA: Diagnosis not present

## 2018-10-24 DIAGNOSIS — E349 Endocrine disorder, unspecified: Secondary | ICD-10-CM | POA: Diagnosis not present

## 2018-10-24 DIAGNOSIS — N400 Enlarged prostate without lower urinary tract symptoms: Secondary | ICD-10-CM | POA: Diagnosis not present

## 2018-10-24 DIAGNOSIS — K219 Gastro-esophageal reflux disease without esophagitis: Secondary | ICD-10-CM | POA: Diagnosis not present

## 2018-10-24 DIAGNOSIS — Z6824 Body mass index (BMI) 24.0-24.9, adult: Secondary | ICD-10-CM | POA: Diagnosis not present

## 2018-10-24 DIAGNOSIS — I10 Essential (primary) hypertension: Secondary | ICD-10-CM | POA: Diagnosis not present

## 2018-11-21 DIAGNOSIS — M545 Low back pain: Secondary | ICD-10-CM | POA: Diagnosis not present

## 2018-11-21 DIAGNOSIS — K589 Irritable bowel syndrome without diarrhea: Secondary | ICD-10-CM | POA: Diagnosis not present

## 2018-11-21 DIAGNOSIS — J0121 Acute recurrent ethmoidal sinusitis: Secondary | ICD-10-CM | POA: Diagnosis not present

## 2018-11-21 DIAGNOSIS — J301 Allergic rhinitis due to pollen: Secondary | ICD-10-CM | POA: Diagnosis not present

## 2018-11-21 DIAGNOSIS — F419 Anxiety disorder, unspecified: Secondary | ICD-10-CM | POA: Diagnosis not present

## 2018-11-21 DIAGNOSIS — F5101 Primary insomnia: Secondary | ICD-10-CM | POA: Diagnosis not present

## 2018-11-21 DIAGNOSIS — E7800 Pure hypercholesterolemia, unspecified: Secondary | ICD-10-CM | POA: Diagnosis not present

## 2018-11-21 DIAGNOSIS — K219 Gastro-esophageal reflux disease without esophagitis: Secondary | ICD-10-CM | POA: Diagnosis not present

## 2018-11-21 DIAGNOSIS — J302 Other seasonal allergic rhinitis: Secondary | ICD-10-CM | POA: Diagnosis not present

## 2018-11-21 DIAGNOSIS — N529 Male erectile dysfunction, unspecified: Secondary | ICD-10-CM | POA: Diagnosis not present

## 2018-11-21 DIAGNOSIS — E349 Endocrine disorder, unspecified: Secondary | ICD-10-CM | POA: Diagnosis not present

## 2018-11-21 DIAGNOSIS — R3129 Other microscopic hematuria: Secondary | ICD-10-CM | POA: Diagnosis not present

## 2018-11-21 DIAGNOSIS — N400 Enlarged prostate without lower urinary tract symptoms: Secondary | ICD-10-CM | POA: Diagnosis not present

## 2018-11-21 DIAGNOSIS — F3342 Major depressive disorder, recurrent, in full remission: Secondary | ICD-10-CM | POA: Diagnosis not present

## 2018-11-21 DIAGNOSIS — Z9109 Other allergy status, other than to drugs and biological substances: Secondary | ICD-10-CM | POA: Insufficient documentation

## 2018-12-04 DIAGNOSIS — H43811 Vitreous degeneration, right eye: Secondary | ICD-10-CM | POA: Diagnosis not present

## 2018-12-04 DIAGNOSIS — H52223 Regular astigmatism, bilateral: Secondary | ICD-10-CM | POA: Diagnosis not present

## 2018-12-06 DIAGNOSIS — Z6825 Body mass index (BMI) 25.0-25.9, adult: Secondary | ICD-10-CM | POA: Diagnosis not present

## 2018-12-06 DIAGNOSIS — E7800 Pure hypercholesterolemia, unspecified: Secondary | ICD-10-CM | POA: Diagnosis not present

## 2018-12-06 DIAGNOSIS — F3342 Major depressive disorder, recurrent, in full remission: Secondary | ICD-10-CM | POA: Diagnosis not present

## 2018-12-06 DIAGNOSIS — F419 Anxiety disorder, unspecified: Secondary | ICD-10-CM | POA: Diagnosis not present

## 2018-12-06 DIAGNOSIS — K219 Gastro-esophageal reflux disease without esophagitis: Secondary | ICD-10-CM | POA: Diagnosis not present

## 2018-12-06 DIAGNOSIS — N529 Male erectile dysfunction, unspecified: Secondary | ICD-10-CM | POA: Diagnosis not present

## 2018-12-06 DIAGNOSIS — J069 Acute upper respiratory infection, unspecified: Secondary | ICD-10-CM | POA: Diagnosis not present

## 2018-12-06 DIAGNOSIS — N400 Enlarged prostate without lower urinary tract symptoms: Secondary | ICD-10-CM | POA: Diagnosis not present

## 2018-12-06 DIAGNOSIS — J302 Other seasonal allergic rhinitis: Secondary | ICD-10-CM | POA: Diagnosis not present

## 2018-12-06 DIAGNOSIS — F5101 Primary insomnia: Secondary | ICD-10-CM | POA: Diagnosis not present

## 2018-12-06 DIAGNOSIS — R3129 Other microscopic hematuria: Secondary | ICD-10-CM | POA: Diagnosis not present

## 2018-12-20 DIAGNOSIS — E349 Endocrine disorder, unspecified: Secondary | ICD-10-CM | POA: Diagnosis not present

## 2019-01-17 DIAGNOSIS — K219 Gastro-esophageal reflux disease without esophagitis: Secondary | ICD-10-CM | POA: Diagnosis not present

## 2019-01-17 DIAGNOSIS — J302 Other seasonal allergic rhinitis: Secondary | ICD-10-CM | POA: Diagnosis not present

## 2019-01-17 DIAGNOSIS — Z139 Encounter for screening, unspecified: Secondary | ICD-10-CM | POA: Diagnosis not present

## 2019-01-17 DIAGNOSIS — F5101 Primary insomnia: Secondary | ICD-10-CM | POA: Diagnosis not present

## 2019-01-17 DIAGNOSIS — K589 Irritable bowel syndrome without diarrhea: Secondary | ICD-10-CM | POA: Diagnosis not present

## 2019-01-17 DIAGNOSIS — N400 Enlarged prostate without lower urinary tract symptoms: Secondary | ICD-10-CM | POA: Diagnosis not present

## 2019-01-17 DIAGNOSIS — F3342 Major depressive disorder, recurrent, in full remission: Secondary | ICD-10-CM | POA: Diagnosis not present

## 2019-01-17 DIAGNOSIS — N529 Male erectile dysfunction, unspecified: Secondary | ICD-10-CM | POA: Diagnosis not present

## 2019-01-17 DIAGNOSIS — R3129 Other microscopic hematuria: Secondary | ICD-10-CM | POA: Diagnosis not present

## 2019-01-17 DIAGNOSIS — F419 Anxiety disorder, unspecified: Secondary | ICD-10-CM | POA: Diagnosis not present

## 2019-01-17 DIAGNOSIS — Z9181 History of falling: Secondary | ICD-10-CM | POA: Diagnosis not present

## 2019-01-17 DIAGNOSIS — E7800 Pure hypercholesterolemia, unspecified: Secondary | ICD-10-CM | POA: Diagnosis not present

## 2019-01-19 DIAGNOSIS — E349 Endocrine disorder, unspecified: Secondary | ICD-10-CM | POA: Diagnosis not present

## 2019-02-02 DIAGNOSIS — M4804 Spinal stenosis, thoracic region: Secondary | ICD-10-CM | POA: Diagnosis not present

## 2019-02-02 DIAGNOSIS — I10 Essential (primary) hypertension: Secondary | ICD-10-CM | POA: Diagnosis not present

## 2019-02-06 DIAGNOSIS — Z125 Encounter for screening for malignant neoplasm of prostate: Secondary | ICD-10-CM | POA: Diagnosis not present

## 2019-02-06 DIAGNOSIS — E785 Hyperlipidemia, unspecified: Secondary | ICD-10-CM | POA: Diagnosis not present

## 2019-02-06 DIAGNOSIS — Z1331 Encounter for screening for depression: Secondary | ICD-10-CM | POA: Diagnosis not present

## 2019-02-06 DIAGNOSIS — Z0000 Encounter for general adult medical examination without abnormal findings: Secondary | ICD-10-CM | POA: Diagnosis not present

## 2019-02-06 DIAGNOSIS — Z9181 History of falling: Secondary | ICD-10-CM | POA: Diagnosis not present

## 2019-02-09 DIAGNOSIS — D485 Neoplasm of uncertain behavior of skin: Secondary | ICD-10-CM | POA: Diagnosis not present

## 2019-02-09 DIAGNOSIS — L570 Actinic keratosis: Secondary | ICD-10-CM | POA: Diagnosis not present

## 2019-02-12 DIAGNOSIS — M5414 Radiculopathy, thoracic region: Secondary | ICD-10-CM | POA: Diagnosis not present

## 2019-02-12 DIAGNOSIS — M5114 Intervertebral disc disorders with radiculopathy, thoracic region: Secondary | ICD-10-CM | POA: Diagnosis not present

## 2019-03-02 DIAGNOSIS — N400 Enlarged prostate without lower urinary tract symptoms: Secondary | ICD-10-CM | POA: Diagnosis not present

## 2019-03-02 DIAGNOSIS — F5101 Primary insomnia: Secondary | ICD-10-CM | POA: Diagnosis not present

## 2019-03-02 DIAGNOSIS — K219 Gastro-esophageal reflux disease without esophagitis: Secondary | ICD-10-CM | POA: Diagnosis not present

## 2019-03-02 DIAGNOSIS — E349 Endocrine disorder, unspecified: Secondary | ICD-10-CM | POA: Diagnosis not present

## 2019-03-02 DIAGNOSIS — N529 Male erectile dysfunction, unspecified: Secondary | ICD-10-CM | POA: Diagnosis not present

## 2019-03-02 DIAGNOSIS — J302 Other seasonal allergic rhinitis: Secondary | ICD-10-CM | POA: Diagnosis not present

## 2019-03-02 DIAGNOSIS — F419 Anxiety disorder, unspecified: Secondary | ICD-10-CM | POA: Diagnosis not present

## 2019-03-02 DIAGNOSIS — E7800 Pure hypercholesterolemia, unspecified: Secondary | ICD-10-CM | POA: Diagnosis not present

## 2019-03-02 DIAGNOSIS — R3129 Other microscopic hematuria: Secondary | ICD-10-CM | POA: Diagnosis not present

## 2019-03-02 DIAGNOSIS — F3342 Major depressive disorder, recurrent, in full remission: Secondary | ICD-10-CM | POA: Diagnosis not present

## 2019-03-03 DIAGNOSIS — D0462 Carcinoma in situ of skin of left upper limb, including shoulder: Secondary | ICD-10-CM | POA: Diagnosis not present

## 2019-03-03 DIAGNOSIS — D0471 Carcinoma in situ of skin of right lower limb, including hip: Secondary | ICD-10-CM | POA: Diagnosis not present

## 2019-03-31 DIAGNOSIS — K219 Gastro-esophageal reflux disease without esophagitis: Secondary | ICD-10-CM | POA: Diagnosis not present

## 2019-03-31 DIAGNOSIS — I10 Essential (primary) hypertension: Secondary | ICD-10-CM | POA: Diagnosis not present

## 2019-03-31 DIAGNOSIS — F5101 Primary insomnia: Secondary | ICD-10-CM | POA: Diagnosis not present

## 2019-03-31 DIAGNOSIS — E349 Endocrine disorder, unspecified: Secondary | ICD-10-CM | POA: Diagnosis not present

## 2019-03-31 DIAGNOSIS — R3129 Other microscopic hematuria: Secondary | ICD-10-CM | POA: Diagnosis not present

## 2019-03-31 DIAGNOSIS — N529 Male erectile dysfunction, unspecified: Secondary | ICD-10-CM | POA: Diagnosis not present

## 2019-03-31 DIAGNOSIS — F3342 Major depressive disorder, recurrent, in full remission: Secondary | ICD-10-CM | POA: Diagnosis not present

## 2019-03-31 DIAGNOSIS — J302 Other seasonal allergic rhinitis: Secondary | ICD-10-CM | POA: Diagnosis not present

## 2019-03-31 DIAGNOSIS — N400 Enlarged prostate without lower urinary tract symptoms: Secondary | ICD-10-CM | POA: Diagnosis not present

## 2019-03-31 DIAGNOSIS — F419 Anxiety disorder, unspecified: Secondary | ICD-10-CM | POA: Diagnosis not present

## 2019-03-31 DIAGNOSIS — E118 Type 2 diabetes mellitus with unspecified complications: Secondary | ICD-10-CM | POA: Diagnosis not present

## 2019-03-31 DIAGNOSIS — E7800 Pure hypercholesterolemia, unspecified: Secondary | ICD-10-CM | POA: Diagnosis not present

## 2019-04-16 DIAGNOSIS — F419 Anxiety disorder, unspecified: Secondary | ICD-10-CM | POA: Diagnosis not present

## 2019-04-16 DIAGNOSIS — E349 Endocrine disorder, unspecified: Secondary | ICD-10-CM | POA: Diagnosis not present

## 2019-04-16 DIAGNOSIS — R3129 Other microscopic hematuria: Secondary | ICD-10-CM | POA: Diagnosis not present

## 2019-04-16 DIAGNOSIS — F3342 Major depressive disorder, recurrent, in full remission: Secondary | ICD-10-CM | POA: Diagnosis not present

## 2019-04-16 DIAGNOSIS — K219 Gastro-esophageal reflux disease without esophagitis: Secondary | ICD-10-CM | POA: Diagnosis not present

## 2019-04-16 DIAGNOSIS — F5101 Primary insomnia: Secondary | ICD-10-CM | POA: Diagnosis not present

## 2019-04-16 DIAGNOSIS — N400 Enlarged prostate without lower urinary tract symptoms: Secondary | ICD-10-CM | POA: Diagnosis not present

## 2019-04-16 DIAGNOSIS — J302 Other seasonal allergic rhinitis: Secondary | ICD-10-CM | POA: Diagnosis not present

## 2019-04-16 DIAGNOSIS — K589 Irritable bowel syndrome without diarrhea: Secondary | ICD-10-CM | POA: Diagnosis not present

## 2019-04-16 DIAGNOSIS — N529 Male erectile dysfunction, unspecified: Secondary | ICD-10-CM | POA: Diagnosis not present

## 2019-04-16 DIAGNOSIS — J208 Acute bronchitis due to other specified organisms: Secondary | ICD-10-CM | POA: Diagnosis not present

## 2019-04-16 DIAGNOSIS — E7800 Pure hypercholesterolemia, unspecified: Secondary | ICD-10-CM | POA: Diagnosis not present

## 2019-04-30 DIAGNOSIS — R3129 Other microscopic hematuria: Secondary | ICD-10-CM | POA: Diagnosis not present

## 2019-04-30 DIAGNOSIS — F419 Anxiety disorder, unspecified: Secondary | ICD-10-CM | POA: Diagnosis not present

## 2019-04-30 DIAGNOSIS — Z6825 Body mass index (BMI) 25.0-25.9, adult: Secondary | ICD-10-CM | POA: Diagnosis not present

## 2019-04-30 DIAGNOSIS — K219 Gastro-esophageal reflux disease without esophagitis: Secondary | ICD-10-CM | POA: Diagnosis not present

## 2019-04-30 DIAGNOSIS — E7800 Pure hypercholesterolemia, unspecified: Secondary | ICD-10-CM | POA: Diagnosis not present

## 2019-04-30 DIAGNOSIS — E349 Endocrine disorder, unspecified: Secondary | ICD-10-CM | POA: Diagnosis not present

## 2019-04-30 DIAGNOSIS — N529 Male erectile dysfunction, unspecified: Secondary | ICD-10-CM | POA: Diagnosis not present

## 2019-04-30 DIAGNOSIS — J302 Other seasonal allergic rhinitis: Secondary | ICD-10-CM | POA: Diagnosis not present

## 2019-04-30 DIAGNOSIS — F5101 Primary insomnia: Secondary | ICD-10-CM | POA: Diagnosis not present

## 2019-04-30 DIAGNOSIS — F3342 Major depressive disorder, recurrent, in full remission: Secondary | ICD-10-CM | POA: Diagnosis not present

## 2019-04-30 DIAGNOSIS — N400 Enlarged prostate without lower urinary tract symptoms: Secondary | ICD-10-CM | POA: Diagnosis not present

## 2019-05-14 DIAGNOSIS — K219 Gastro-esophageal reflux disease without esophagitis: Secondary | ICD-10-CM | POA: Diagnosis not present

## 2019-05-14 DIAGNOSIS — R3129 Other microscopic hematuria: Secondary | ICD-10-CM | POA: Diagnosis not present

## 2019-05-14 DIAGNOSIS — N529 Male erectile dysfunction, unspecified: Secondary | ICD-10-CM | POA: Diagnosis not present

## 2019-05-14 DIAGNOSIS — N400 Enlarged prostate without lower urinary tract symptoms: Secondary | ICD-10-CM | POA: Diagnosis not present

## 2019-05-14 DIAGNOSIS — F5101 Primary insomnia: Secondary | ICD-10-CM | POA: Diagnosis not present

## 2019-05-14 DIAGNOSIS — F3342 Major depressive disorder, recurrent, in full remission: Secondary | ICD-10-CM | POA: Diagnosis not present

## 2019-05-14 DIAGNOSIS — K589 Irritable bowel syndrome without diarrhea: Secondary | ICD-10-CM | POA: Diagnosis not present

## 2019-05-14 DIAGNOSIS — J302 Other seasonal allergic rhinitis: Secondary | ICD-10-CM | POA: Diagnosis not present

## 2019-05-14 DIAGNOSIS — M545 Low back pain: Secondary | ICD-10-CM | POA: Diagnosis not present

## 2019-05-14 DIAGNOSIS — E349 Endocrine disorder, unspecified: Secondary | ICD-10-CM | POA: Diagnosis not present

## 2019-05-14 DIAGNOSIS — F419 Anxiety disorder, unspecified: Secondary | ICD-10-CM | POA: Diagnosis not present

## 2019-05-14 DIAGNOSIS — E7800 Pure hypercholesterolemia, unspecified: Secondary | ICD-10-CM | POA: Diagnosis not present

## 2019-05-28 DIAGNOSIS — E349 Endocrine disorder, unspecified: Secondary | ICD-10-CM | POA: Diagnosis not present

## 2019-05-28 DIAGNOSIS — F3342 Major depressive disorder, recurrent, in full remission: Secondary | ICD-10-CM | POA: Diagnosis not present

## 2019-05-28 DIAGNOSIS — K219 Gastro-esophageal reflux disease without esophagitis: Secondary | ICD-10-CM | POA: Diagnosis not present

## 2019-05-28 DIAGNOSIS — E7800 Pure hypercholesterolemia, unspecified: Secondary | ICD-10-CM | POA: Diagnosis not present

## 2019-05-28 DIAGNOSIS — M545 Low back pain: Secondary | ICD-10-CM | POA: Diagnosis not present

## 2019-05-28 DIAGNOSIS — N529 Male erectile dysfunction, unspecified: Secondary | ICD-10-CM | POA: Diagnosis not present

## 2019-05-28 DIAGNOSIS — F419 Anxiety disorder, unspecified: Secondary | ICD-10-CM | POA: Diagnosis not present

## 2019-05-28 DIAGNOSIS — J302 Other seasonal allergic rhinitis: Secondary | ICD-10-CM | POA: Diagnosis not present

## 2019-05-28 DIAGNOSIS — K589 Irritable bowel syndrome without diarrhea: Secondary | ICD-10-CM | POA: Diagnosis not present

## 2019-05-28 DIAGNOSIS — N400 Enlarged prostate without lower urinary tract symptoms: Secondary | ICD-10-CM | POA: Diagnosis not present

## 2019-05-28 DIAGNOSIS — F5101 Primary insomnia: Secondary | ICD-10-CM | POA: Diagnosis not present

## 2019-05-28 DIAGNOSIS — R3129 Other microscopic hematuria: Secondary | ICD-10-CM | POA: Diagnosis not present

## 2019-06-25 DIAGNOSIS — K219 Gastro-esophageal reflux disease without esophagitis: Secondary | ICD-10-CM | POA: Diagnosis not present

## 2019-06-25 DIAGNOSIS — R3129 Other microscopic hematuria: Secondary | ICD-10-CM | POA: Diagnosis not present

## 2019-06-25 DIAGNOSIS — J302 Other seasonal allergic rhinitis: Secondary | ICD-10-CM | POA: Diagnosis not present

## 2019-06-25 DIAGNOSIS — E7800 Pure hypercholesterolemia, unspecified: Secondary | ICD-10-CM | POA: Diagnosis not present

## 2019-06-25 DIAGNOSIS — M545 Low back pain: Secondary | ICD-10-CM | POA: Diagnosis not present

## 2019-06-25 DIAGNOSIS — F5101 Primary insomnia: Secondary | ICD-10-CM | POA: Diagnosis not present

## 2019-06-25 DIAGNOSIS — F419 Anxiety disorder, unspecified: Secondary | ICD-10-CM | POA: Diagnosis not present

## 2019-06-25 DIAGNOSIS — E349 Endocrine disorder, unspecified: Secondary | ICD-10-CM | POA: Diagnosis not present

## 2019-06-25 DIAGNOSIS — N529 Male erectile dysfunction, unspecified: Secondary | ICD-10-CM | POA: Diagnosis not present

## 2019-06-25 DIAGNOSIS — N400 Enlarged prostate without lower urinary tract symptoms: Secondary | ICD-10-CM | POA: Diagnosis not present

## 2019-06-25 DIAGNOSIS — F3342 Major depressive disorder, recurrent, in full remission: Secondary | ICD-10-CM | POA: Diagnosis not present

## 2019-06-25 DIAGNOSIS — M25552 Pain in left hip: Secondary | ICD-10-CM | POA: Diagnosis not present

## 2019-07-02 DIAGNOSIS — F3342 Major depressive disorder, recurrent, in full remission: Secondary | ICD-10-CM | POA: Diagnosis not present

## 2019-07-02 DIAGNOSIS — K219 Gastro-esophageal reflux disease without esophagitis: Secondary | ICD-10-CM | POA: Diagnosis not present

## 2019-07-02 DIAGNOSIS — E7800 Pure hypercholesterolemia, unspecified: Secondary | ICD-10-CM | POA: Diagnosis not present

## 2019-07-02 DIAGNOSIS — N529 Male erectile dysfunction, unspecified: Secondary | ICD-10-CM | POA: Diagnosis not present

## 2019-07-02 DIAGNOSIS — M25552 Pain in left hip: Secondary | ICD-10-CM | POA: Diagnosis not present

## 2019-07-02 DIAGNOSIS — F419 Anxiety disorder, unspecified: Secondary | ICD-10-CM | POA: Diagnosis not present

## 2019-07-02 DIAGNOSIS — Z6824 Body mass index (BMI) 24.0-24.9, adult: Secondary | ICD-10-CM | POA: Diagnosis not present

## 2019-07-02 DIAGNOSIS — J302 Other seasonal allergic rhinitis: Secondary | ICD-10-CM | POA: Diagnosis not present

## 2019-07-02 DIAGNOSIS — R3129 Other microscopic hematuria: Secondary | ICD-10-CM | POA: Diagnosis not present

## 2019-07-02 DIAGNOSIS — N400 Enlarged prostate without lower urinary tract symptoms: Secondary | ICD-10-CM | POA: Diagnosis not present

## 2019-07-02 DIAGNOSIS — F5101 Primary insomnia: Secondary | ICD-10-CM | POA: Diagnosis not present

## 2019-07-04 DIAGNOSIS — I10 Essential (primary) hypertension: Secondary | ICD-10-CM | POA: Diagnosis not present

## 2019-07-04 DIAGNOSIS — M47894 Other spondylosis, thoracic region: Secondary | ICD-10-CM | POA: Diagnosis not present

## 2019-07-04 DIAGNOSIS — M542 Cervicalgia: Secondary | ICD-10-CM | POA: Diagnosis not present

## 2019-07-28 DIAGNOSIS — N529 Male erectile dysfunction, unspecified: Secondary | ICD-10-CM | POA: Diagnosis not present

## 2019-07-28 DIAGNOSIS — F3342 Major depressive disorder, recurrent, in full remission: Secondary | ICD-10-CM | POA: Diagnosis not present

## 2019-07-28 DIAGNOSIS — F5101 Primary insomnia: Secondary | ICD-10-CM | POA: Diagnosis not present

## 2019-07-28 DIAGNOSIS — M25552 Pain in left hip: Secondary | ICD-10-CM | POA: Diagnosis not present

## 2019-07-28 DIAGNOSIS — E7800 Pure hypercholesterolemia, unspecified: Secondary | ICD-10-CM | POA: Diagnosis not present

## 2019-07-28 DIAGNOSIS — K219 Gastro-esophageal reflux disease without esophagitis: Secondary | ICD-10-CM | POA: Diagnosis not present

## 2019-07-28 DIAGNOSIS — R3129 Other microscopic hematuria: Secondary | ICD-10-CM | POA: Diagnosis not present

## 2019-07-28 DIAGNOSIS — N400 Enlarged prostate without lower urinary tract symptoms: Secondary | ICD-10-CM | POA: Diagnosis not present

## 2019-07-28 DIAGNOSIS — F419 Anxiety disorder, unspecified: Secondary | ICD-10-CM | POA: Diagnosis not present

## 2019-07-28 DIAGNOSIS — J302 Other seasonal allergic rhinitis: Secondary | ICD-10-CM | POA: Diagnosis not present

## 2019-07-28 DIAGNOSIS — E349 Endocrine disorder, unspecified: Secondary | ICD-10-CM | POA: Diagnosis not present

## 2019-07-28 DIAGNOSIS — M545 Low back pain: Secondary | ICD-10-CM | POA: Diagnosis not present

## 2019-08-07 DIAGNOSIS — L821 Other seborrheic keratosis: Secondary | ICD-10-CM | POA: Diagnosis not present

## 2019-08-07 DIAGNOSIS — L570 Actinic keratosis: Secondary | ICD-10-CM | POA: Diagnosis not present

## 2019-08-11 DIAGNOSIS — E349 Endocrine disorder, unspecified: Secondary | ICD-10-CM | POA: Diagnosis not present

## 2019-08-11 DIAGNOSIS — F419 Anxiety disorder, unspecified: Secondary | ICD-10-CM | POA: Diagnosis not present

## 2019-08-11 DIAGNOSIS — F3342 Major depressive disorder, recurrent, in full remission: Secondary | ICD-10-CM | POA: Diagnosis not present

## 2019-08-11 DIAGNOSIS — N529 Male erectile dysfunction, unspecified: Secondary | ICD-10-CM | POA: Diagnosis not present

## 2019-08-11 DIAGNOSIS — E118 Type 2 diabetes mellitus with unspecified complications: Secondary | ICD-10-CM | POA: Diagnosis not present

## 2019-08-11 DIAGNOSIS — E7800 Pure hypercholesterolemia, unspecified: Secondary | ICD-10-CM | POA: Diagnosis not present

## 2019-08-11 DIAGNOSIS — F5101 Primary insomnia: Secondary | ICD-10-CM | POA: Diagnosis not present

## 2019-08-11 DIAGNOSIS — R3129 Other microscopic hematuria: Secondary | ICD-10-CM | POA: Diagnosis not present

## 2019-08-11 DIAGNOSIS — I10 Essential (primary) hypertension: Secondary | ICD-10-CM | POA: Diagnosis not present

## 2019-08-11 DIAGNOSIS — J302 Other seasonal allergic rhinitis: Secondary | ICD-10-CM | POA: Diagnosis not present

## 2019-08-11 DIAGNOSIS — K219 Gastro-esophageal reflux disease without esophagitis: Secondary | ICD-10-CM | POA: Diagnosis not present

## 2019-08-11 DIAGNOSIS — N400 Enlarged prostate without lower urinary tract symptoms: Secondary | ICD-10-CM | POA: Diagnosis not present

## 2019-09-06 DIAGNOSIS — M9904 Segmental and somatic dysfunction of sacral region: Secondary | ICD-10-CM | POA: Diagnosis not present

## 2019-09-06 DIAGNOSIS — M4727 Other spondylosis with radiculopathy, lumbosacral region: Secondary | ICD-10-CM | POA: Diagnosis not present

## 2019-09-06 DIAGNOSIS — M4728 Other spondylosis with radiculopathy, sacral and sacrococcygeal region: Secondary | ICD-10-CM | POA: Diagnosis not present

## 2019-09-06 DIAGNOSIS — M5388 Other specified dorsopathies, sacral and sacrococcygeal region: Secondary | ICD-10-CM | POA: Diagnosis not present

## 2019-09-06 DIAGNOSIS — M9903 Segmental and somatic dysfunction of lumbar region: Secondary | ICD-10-CM | POA: Diagnosis not present

## 2019-09-06 DIAGNOSIS — M9905 Segmental and somatic dysfunction of pelvic region: Secondary | ICD-10-CM | POA: Diagnosis not present

## 2019-09-13 DIAGNOSIS — M9903 Segmental and somatic dysfunction of lumbar region: Secondary | ICD-10-CM | POA: Diagnosis not present

## 2019-09-13 DIAGNOSIS — M4727 Other spondylosis with radiculopathy, lumbosacral region: Secondary | ICD-10-CM | POA: Diagnosis not present

## 2019-09-13 DIAGNOSIS — M4728 Other spondylosis with radiculopathy, sacral and sacrococcygeal region: Secondary | ICD-10-CM | POA: Diagnosis not present

## 2019-09-13 DIAGNOSIS — M9905 Segmental and somatic dysfunction of pelvic region: Secondary | ICD-10-CM | POA: Diagnosis not present

## 2019-09-13 DIAGNOSIS — M5388 Other specified dorsopathies, sacral and sacrococcygeal region: Secondary | ICD-10-CM | POA: Diagnosis not present

## 2019-09-13 DIAGNOSIS — M9904 Segmental and somatic dysfunction of sacral region: Secondary | ICD-10-CM | POA: Diagnosis not present

## 2019-09-15 DIAGNOSIS — F419 Anxiety disorder, unspecified: Secondary | ICD-10-CM | POA: Diagnosis not present

## 2019-09-15 DIAGNOSIS — N529 Male erectile dysfunction, unspecified: Secondary | ICD-10-CM | POA: Diagnosis not present

## 2019-09-15 DIAGNOSIS — F5101 Primary insomnia: Secondary | ICD-10-CM | POA: Diagnosis not present

## 2019-09-15 DIAGNOSIS — E349 Endocrine disorder, unspecified: Secondary | ICD-10-CM | POA: Diagnosis not present

## 2019-09-15 DIAGNOSIS — K219 Gastro-esophageal reflux disease without esophagitis: Secondary | ICD-10-CM | POA: Diagnosis not present

## 2019-09-15 DIAGNOSIS — E7800 Pure hypercholesterolemia, unspecified: Secondary | ICD-10-CM | POA: Diagnosis not present

## 2019-09-15 DIAGNOSIS — F3342 Major depressive disorder, recurrent, in full remission: Secondary | ICD-10-CM | POA: Diagnosis not present

## 2019-09-15 DIAGNOSIS — N400 Enlarged prostate without lower urinary tract symptoms: Secondary | ICD-10-CM | POA: Diagnosis not present

## 2019-09-15 DIAGNOSIS — K589 Irritable bowel syndrome without diarrhea: Secondary | ICD-10-CM | POA: Diagnosis not present

## 2019-09-15 DIAGNOSIS — M25552 Pain in left hip: Secondary | ICD-10-CM | POA: Diagnosis not present

## 2019-09-15 DIAGNOSIS — R3129 Other microscopic hematuria: Secondary | ICD-10-CM | POA: Diagnosis not present

## 2019-09-15 DIAGNOSIS — J302 Other seasonal allergic rhinitis: Secondary | ICD-10-CM | POA: Diagnosis not present

## 2019-10-02 DIAGNOSIS — M5388 Other specified dorsopathies, sacral and sacrococcygeal region: Secondary | ICD-10-CM | POA: Diagnosis not present

## 2019-10-02 DIAGNOSIS — M4723 Other spondylosis with radiculopathy, cervicothoracic region: Secondary | ICD-10-CM | POA: Diagnosis not present

## 2019-10-02 DIAGNOSIS — M9902 Segmental and somatic dysfunction of thoracic region: Secondary | ICD-10-CM | POA: Diagnosis not present

## 2019-10-02 DIAGNOSIS — M4724 Other spondylosis with radiculopathy, thoracic region: Secondary | ICD-10-CM | POA: Diagnosis not present

## 2019-10-02 DIAGNOSIS — M9901 Segmental and somatic dysfunction of cervical region: Secondary | ICD-10-CM | POA: Diagnosis not present

## 2019-10-02 DIAGNOSIS — M9904 Segmental and somatic dysfunction of sacral region: Secondary | ICD-10-CM | POA: Diagnosis not present

## 2019-10-09 DIAGNOSIS — M5388 Other specified dorsopathies, sacral and sacrococcygeal region: Secondary | ICD-10-CM | POA: Diagnosis not present

## 2019-10-09 DIAGNOSIS — M9901 Segmental and somatic dysfunction of cervical region: Secondary | ICD-10-CM | POA: Diagnosis not present

## 2019-10-09 DIAGNOSIS — M9904 Segmental and somatic dysfunction of sacral region: Secondary | ICD-10-CM | POA: Diagnosis not present

## 2019-10-09 DIAGNOSIS — M9902 Segmental and somatic dysfunction of thoracic region: Secondary | ICD-10-CM | POA: Diagnosis not present

## 2019-10-09 DIAGNOSIS — M4723 Other spondylosis with radiculopathy, cervicothoracic region: Secondary | ICD-10-CM | POA: Diagnosis not present

## 2019-10-09 DIAGNOSIS — M4724 Other spondylosis with radiculopathy, thoracic region: Secondary | ICD-10-CM | POA: Diagnosis not present

## 2019-10-16 DIAGNOSIS — N400 Enlarged prostate without lower urinary tract symptoms: Secondary | ICD-10-CM | POA: Diagnosis not present

## 2019-10-16 DIAGNOSIS — E349 Endocrine disorder, unspecified: Secondary | ICD-10-CM | POA: Diagnosis not present

## 2019-10-16 DIAGNOSIS — F5101 Primary insomnia: Secondary | ICD-10-CM | POA: Diagnosis not present

## 2019-10-16 DIAGNOSIS — J302 Other seasonal allergic rhinitis: Secondary | ICD-10-CM | POA: Diagnosis not present

## 2019-10-16 DIAGNOSIS — F419 Anxiety disorder, unspecified: Secondary | ICD-10-CM | POA: Diagnosis not present

## 2019-10-16 DIAGNOSIS — F3342 Major depressive disorder, recurrent, in full remission: Secondary | ICD-10-CM | POA: Diagnosis not present

## 2019-10-16 DIAGNOSIS — R3129 Other microscopic hematuria: Secondary | ICD-10-CM | POA: Diagnosis not present

## 2019-10-16 DIAGNOSIS — M25552 Pain in left hip: Secondary | ICD-10-CM | POA: Diagnosis not present

## 2019-10-16 DIAGNOSIS — N529 Male erectile dysfunction, unspecified: Secondary | ICD-10-CM | POA: Diagnosis not present

## 2019-10-16 DIAGNOSIS — E7800 Pure hypercholesterolemia, unspecified: Secondary | ICD-10-CM | POA: Diagnosis not present

## 2019-10-16 DIAGNOSIS — K219 Gastro-esophageal reflux disease without esophagitis: Secondary | ICD-10-CM | POA: Diagnosis not present

## 2019-10-16 DIAGNOSIS — K589 Irritable bowel syndrome without diarrhea: Secondary | ICD-10-CM | POA: Diagnosis not present

## 2019-10-18 DIAGNOSIS — M4723 Other spondylosis with radiculopathy, cervicothoracic region: Secondary | ICD-10-CM | POA: Diagnosis not present

## 2019-10-18 DIAGNOSIS — M9902 Segmental and somatic dysfunction of thoracic region: Secondary | ICD-10-CM | POA: Diagnosis not present

## 2019-10-18 DIAGNOSIS — M9901 Segmental and somatic dysfunction of cervical region: Secondary | ICD-10-CM | POA: Diagnosis not present

## 2019-10-18 DIAGNOSIS — M5388 Other specified dorsopathies, sacral and sacrococcygeal region: Secondary | ICD-10-CM | POA: Diagnosis not present

## 2019-10-18 DIAGNOSIS — M4724 Other spondylosis with radiculopathy, thoracic region: Secondary | ICD-10-CM | POA: Diagnosis not present

## 2019-10-18 DIAGNOSIS — M9904 Segmental and somatic dysfunction of sacral region: Secondary | ICD-10-CM | POA: Diagnosis not present

## 2019-10-26 DIAGNOSIS — M79601 Pain in right arm: Secondary | ICD-10-CM | POA: Diagnosis not present

## 2019-10-30 DIAGNOSIS — E349 Endocrine disorder, unspecified: Secondary | ICD-10-CM | POA: Diagnosis not present

## 2019-10-30 DIAGNOSIS — N400 Enlarged prostate without lower urinary tract symptoms: Secondary | ICD-10-CM | POA: Diagnosis not present

## 2019-10-30 DIAGNOSIS — F3342 Major depressive disorder, recurrent, in full remission: Secondary | ICD-10-CM | POA: Diagnosis not present

## 2019-10-30 DIAGNOSIS — F5101 Primary insomnia: Secondary | ICD-10-CM | POA: Diagnosis not present

## 2019-10-30 DIAGNOSIS — F419 Anxiety disorder, unspecified: Secondary | ICD-10-CM | POA: Diagnosis not present

## 2019-10-30 DIAGNOSIS — J302 Other seasonal allergic rhinitis: Secondary | ICD-10-CM | POA: Diagnosis not present

## 2019-10-30 DIAGNOSIS — K219 Gastro-esophageal reflux disease without esophagitis: Secondary | ICD-10-CM | POA: Diagnosis not present

## 2019-10-30 DIAGNOSIS — E7800 Pure hypercholesterolemia, unspecified: Secondary | ICD-10-CM | POA: Diagnosis not present

## 2019-10-30 DIAGNOSIS — R3129 Other microscopic hematuria: Secondary | ICD-10-CM | POA: Diagnosis not present

## 2019-10-30 DIAGNOSIS — M545 Low back pain: Secondary | ICD-10-CM | POA: Diagnosis not present

## 2019-10-30 DIAGNOSIS — K589 Irritable bowel syndrome without diarrhea: Secondary | ICD-10-CM | POA: Diagnosis not present

## 2019-10-30 DIAGNOSIS — N529 Male erectile dysfunction, unspecified: Secondary | ICD-10-CM | POA: Diagnosis not present

## 2019-10-31 DIAGNOSIS — L820 Inflamed seborrheic keratosis: Secondary | ICD-10-CM | POA: Diagnosis not present

## 2019-11-06 ENCOUNTER — Other Ambulatory Visit: Payer: Self-pay

## 2019-11-06 DIAGNOSIS — M9901 Segmental and somatic dysfunction of cervical region: Secondary | ICD-10-CM | POA: Diagnosis not present

## 2019-11-06 DIAGNOSIS — M9902 Segmental and somatic dysfunction of thoracic region: Secondary | ICD-10-CM | POA: Diagnosis not present

## 2019-11-06 DIAGNOSIS — M47812 Spondylosis without myelopathy or radiculopathy, cervical region: Secondary | ICD-10-CM | POA: Diagnosis not present

## 2019-11-06 DIAGNOSIS — M9904 Segmental and somatic dysfunction of sacral region: Secondary | ICD-10-CM | POA: Diagnosis not present

## 2019-11-06 DIAGNOSIS — M5388 Other specified dorsopathies, sacral and sacrococcygeal region: Secondary | ICD-10-CM | POA: Diagnosis not present

## 2019-11-06 DIAGNOSIS — M4724 Other spondylosis with radiculopathy, thoracic region: Secondary | ICD-10-CM | POA: Diagnosis not present

## 2019-11-13 DIAGNOSIS — M5117 Intervertebral disc disorders with radiculopathy, lumbosacral region: Secondary | ICD-10-CM | POA: Diagnosis not present

## 2019-11-13 DIAGNOSIS — M5416 Radiculopathy, lumbar region: Secondary | ICD-10-CM | POA: Diagnosis not present

## 2019-11-19 DIAGNOSIS — M545 Low back pain: Secondary | ICD-10-CM | POA: Diagnosis not present

## 2019-11-19 DIAGNOSIS — I10 Essential (primary) hypertension: Secondary | ICD-10-CM | POA: Diagnosis not present

## 2019-11-19 DIAGNOSIS — R3129 Other microscopic hematuria: Secondary | ICD-10-CM | POA: Diagnosis not present

## 2019-11-19 DIAGNOSIS — N529 Male erectile dysfunction, unspecified: Secondary | ICD-10-CM | POA: Diagnosis not present

## 2019-11-19 DIAGNOSIS — J302 Other seasonal allergic rhinitis: Secondary | ICD-10-CM | POA: Diagnosis not present

## 2019-11-19 DIAGNOSIS — N400 Enlarged prostate without lower urinary tract symptoms: Secondary | ICD-10-CM | POA: Diagnosis not present

## 2019-11-19 DIAGNOSIS — K219 Gastro-esophageal reflux disease without esophagitis: Secondary | ICD-10-CM | POA: Diagnosis not present

## 2019-11-19 DIAGNOSIS — E349 Endocrine disorder, unspecified: Secondary | ICD-10-CM | POA: Diagnosis not present

## 2019-11-19 DIAGNOSIS — E118 Type 2 diabetes mellitus with unspecified complications: Secondary | ICD-10-CM | POA: Diagnosis not present

## 2019-11-19 DIAGNOSIS — F3342 Major depressive disorder, recurrent, in full remission: Secondary | ICD-10-CM | POA: Diagnosis not present

## 2019-11-19 DIAGNOSIS — E7800 Pure hypercholesterolemia, unspecified: Secondary | ICD-10-CM | POA: Diagnosis not present

## 2019-11-19 DIAGNOSIS — F419 Anxiety disorder, unspecified: Secondary | ICD-10-CM | POA: Diagnosis not present

## 2019-11-19 DIAGNOSIS — K649 Unspecified hemorrhoids: Secondary | ICD-10-CM | POA: Diagnosis not present

## 2019-11-19 DIAGNOSIS — F5101 Primary insomnia: Secondary | ICD-10-CM | POA: Diagnosis not present

## 2019-11-26 DIAGNOSIS — M5388 Other specified dorsopathies, sacral and sacrococcygeal region: Secondary | ICD-10-CM | POA: Diagnosis not present

## 2019-11-26 DIAGNOSIS — M4724 Other spondylosis with radiculopathy, thoracic region: Secondary | ICD-10-CM | POA: Diagnosis not present

## 2019-11-26 DIAGNOSIS — M9904 Segmental and somatic dysfunction of sacral region: Secondary | ICD-10-CM | POA: Diagnosis not present

## 2019-11-26 DIAGNOSIS — M9902 Segmental and somatic dysfunction of thoracic region: Secondary | ICD-10-CM | POA: Diagnosis not present

## 2019-11-26 DIAGNOSIS — M9901 Segmental and somatic dysfunction of cervical region: Secondary | ICD-10-CM | POA: Diagnosis not present

## 2019-11-26 DIAGNOSIS — M47812 Spondylosis without myelopathy or radiculopathy, cervical region: Secondary | ICD-10-CM | POA: Diagnosis not present

## 2019-12-06 DIAGNOSIS — F5101 Primary insomnia: Secondary | ICD-10-CM | POA: Diagnosis not present

## 2019-12-06 DIAGNOSIS — M545 Low back pain: Secondary | ICD-10-CM | POA: Diagnosis not present

## 2019-12-06 DIAGNOSIS — E349 Endocrine disorder, unspecified: Secondary | ICD-10-CM | POA: Diagnosis not present

## 2019-12-06 DIAGNOSIS — K589 Irritable bowel syndrome without diarrhea: Secondary | ICD-10-CM | POA: Diagnosis not present

## 2019-12-06 DIAGNOSIS — N400 Enlarged prostate without lower urinary tract symptoms: Secondary | ICD-10-CM | POA: Diagnosis not present

## 2019-12-06 DIAGNOSIS — E7800 Pure hypercholesterolemia, unspecified: Secondary | ICD-10-CM | POA: Diagnosis not present

## 2019-12-06 DIAGNOSIS — K219 Gastro-esophageal reflux disease without esophagitis: Secondary | ICD-10-CM | POA: Diagnosis not present

## 2019-12-06 DIAGNOSIS — F3342 Major depressive disorder, recurrent, in full remission: Secondary | ICD-10-CM | POA: Diagnosis not present

## 2019-12-06 DIAGNOSIS — J302 Other seasonal allergic rhinitis: Secondary | ICD-10-CM | POA: Diagnosis not present

## 2019-12-06 DIAGNOSIS — R3129 Other microscopic hematuria: Secondary | ICD-10-CM | POA: Diagnosis not present

## 2019-12-06 DIAGNOSIS — F419 Anxiety disorder, unspecified: Secondary | ICD-10-CM | POA: Diagnosis not present

## 2019-12-06 DIAGNOSIS — N529 Male erectile dysfunction, unspecified: Secondary | ICD-10-CM | POA: Diagnosis not present

## 2019-12-29 DIAGNOSIS — K589 Irritable bowel syndrome without diarrhea: Secondary | ICD-10-CM | POA: Diagnosis not present

## 2019-12-29 DIAGNOSIS — R3129 Other microscopic hematuria: Secondary | ICD-10-CM | POA: Diagnosis not present

## 2019-12-29 DIAGNOSIS — F419 Anxiety disorder, unspecified: Secondary | ICD-10-CM | POA: Diagnosis not present

## 2019-12-29 DIAGNOSIS — M545 Low back pain: Secondary | ICD-10-CM | POA: Diagnosis not present

## 2019-12-29 DIAGNOSIS — F5101 Primary insomnia: Secondary | ICD-10-CM | POA: Diagnosis not present

## 2019-12-29 DIAGNOSIS — F3342 Major depressive disorder, recurrent, in full remission: Secondary | ICD-10-CM | POA: Diagnosis not present

## 2019-12-29 DIAGNOSIS — E7800 Pure hypercholesterolemia, unspecified: Secondary | ICD-10-CM | POA: Diagnosis not present

## 2019-12-29 DIAGNOSIS — K219 Gastro-esophageal reflux disease without esophagitis: Secondary | ICD-10-CM | POA: Diagnosis not present

## 2019-12-29 DIAGNOSIS — N529 Male erectile dysfunction, unspecified: Secondary | ICD-10-CM | POA: Diagnosis not present

## 2019-12-29 DIAGNOSIS — E349 Endocrine disorder, unspecified: Secondary | ICD-10-CM | POA: Diagnosis not present

## 2019-12-29 DIAGNOSIS — N400 Enlarged prostate without lower urinary tract symptoms: Secondary | ICD-10-CM | POA: Diagnosis not present

## 2019-12-29 DIAGNOSIS — J302 Other seasonal allergic rhinitis: Secondary | ICD-10-CM | POA: Diagnosis not present

## 2020-01-01 DIAGNOSIS — M9902 Segmental and somatic dysfunction of thoracic region: Secondary | ICD-10-CM | POA: Diagnosis not present

## 2020-01-01 DIAGNOSIS — M4725 Other spondylosis with radiculopathy, thoracolumbar region: Secondary | ICD-10-CM | POA: Diagnosis not present

## 2020-01-01 DIAGNOSIS — M5388 Other specified dorsopathies, sacral and sacrococcygeal region: Secondary | ICD-10-CM | POA: Diagnosis not present

## 2020-01-01 DIAGNOSIS — M9903 Segmental and somatic dysfunction of lumbar region: Secondary | ICD-10-CM | POA: Diagnosis not present

## 2020-01-01 DIAGNOSIS — M4726 Other spondylosis with radiculopathy, lumbar region: Secondary | ICD-10-CM | POA: Diagnosis not present

## 2020-01-01 DIAGNOSIS — M9904 Segmental and somatic dysfunction of sacral region: Secondary | ICD-10-CM | POA: Diagnosis not present

## 2020-01-15 DIAGNOSIS — G609 Hereditary and idiopathic neuropathy, unspecified: Secondary | ICD-10-CM | POA: Diagnosis not present

## 2020-01-15 DIAGNOSIS — M14671 Charcot's joint, right ankle and foot: Secondary | ICD-10-CM | POA: Diagnosis not present

## 2020-01-15 DIAGNOSIS — M79671 Pain in right foot: Secondary | ICD-10-CM | POA: Diagnosis not present

## 2020-01-28 DIAGNOSIS — F3342 Major depressive disorder, recurrent, in full remission: Secondary | ICD-10-CM | POA: Diagnosis not present

## 2020-01-28 DIAGNOSIS — J302 Other seasonal allergic rhinitis: Secondary | ICD-10-CM | POA: Diagnosis not present

## 2020-01-28 DIAGNOSIS — M659 Synovitis and tenosynovitis, unspecified: Secondary | ICD-10-CM | POA: Diagnosis not present

## 2020-01-28 DIAGNOSIS — F419 Anxiety disorder, unspecified: Secondary | ICD-10-CM | POA: Diagnosis not present

## 2020-01-28 DIAGNOSIS — E7800 Pure hypercholesterolemia, unspecified: Secondary | ICD-10-CM | POA: Diagnosis not present

## 2020-01-28 DIAGNOSIS — R3129 Other microscopic hematuria: Secondary | ICD-10-CM | POA: Diagnosis not present

## 2020-01-28 DIAGNOSIS — N400 Enlarged prostate without lower urinary tract symptoms: Secondary | ICD-10-CM | POA: Diagnosis not present

## 2020-01-28 DIAGNOSIS — F5101 Primary insomnia: Secondary | ICD-10-CM | POA: Diagnosis not present

## 2020-01-28 DIAGNOSIS — K219 Gastro-esophageal reflux disease without esophagitis: Secondary | ICD-10-CM | POA: Diagnosis not present

## 2020-01-28 DIAGNOSIS — M545 Low back pain, unspecified: Secondary | ICD-10-CM | POA: Diagnosis not present

## 2020-01-28 DIAGNOSIS — N529 Male erectile dysfunction, unspecified: Secondary | ICD-10-CM | POA: Diagnosis not present

## 2020-01-28 DIAGNOSIS — E349 Endocrine disorder, unspecified: Secondary | ICD-10-CM | POA: Diagnosis not present

## 2020-02-05 DIAGNOSIS — R3129 Other microscopic hematuria: Secondary | ICD-10-CM | POA: Diagnosis not present

## 2020-02-05 DIAGNOSIS — N529 Male erectile dysfunction, unspecified: Secondary | ICD-10-CM | POA: Diagnosis not present

## 2020-02-05 DIAGNOSIS — E349 Endocrine disorder, unspecified: Secondary | ICD-10-CM | POA: Diagnosis not present

## 2020-02-05 DIAGNOSIS — F5101 Primary insomnia: Secondary | ICD-10-CM | POA: Diagnosis not present

## 2020-02-05 DIAGNOSIS — N400 Enlarged prostate without lower urinary tract symptoms: Secondary | ICD-10-CM | POA: Diagnosis not present

## 2020-02-05 DIAGNOSIS — E7800 Pure hypercholesterolemia, unspecified: Secondary | ICD-10-CM | POA: Diagnosis not present

## 2020-02-05 DIAGNOSIS — M659 Synovitis and tenosynovitis, unspecified: Secondary | ICD-10-CM | POA: Diagnosis not present

## 2020-02-05 DIAGNOSIS — Z139 Encounter for screening, unspecified: Secondary | ICD-10-CM | POA: Diagnosis not present

## 2020-02-05 DIAGNOSIS — F3342 Major depressive disorder, recurrent, in full remission: Secondary | ICD-10-CM | POA: Diagnosis not present

## 2020-02-05 DIAGNOSIS — J302 Other seasonal allergic rhinitis: Secondary | ICD-10-CM | POA: Diagnosis not present

## 2020-02-05 DIAGNOSIS — F419 Anxiety disorder, unspecified: Secondary | ICD-10-CM | POA: Diagnosis not present

## 2020-02-05 DIAGNOSIS — L570 Actinic keratosis: Secondary | ICD-10-CM | POA: Diagnosis not present

## 2020-02-05 DIAGNOSIS — K219 Gastro-esophageal reflux disease without esophagitis: Secondary | ICD-10-CM | POA: Diagnosis not present

## 2020-02-06 DIAGNOSIS — M5136 Other intervertebral disc degeneration, lumbar region: Secondary | ICD-10-CM | POA: Diagnosis not present

## 2020-02-06 DIAGNOSIS — I10 Essential (primary) hypertension: Secondary | ICD-10-CM | POA: Diagnosis not present

## 2020-02-13 DIAGNOSIS — M9903 Segmental and somatic dysfunction of lumbar region: Secondary | ICD-10-CM | POA: Diagnosis not present

## 2020-02-13 DIAGNOSIS — M4726 Other spondylosis with radiculopathy, lumbar region: Secondary | ICD-10-CM | POA: Diagnosis not present

## 2020-02-14 DIAGNOSIS — Z20828 Contact with and (suspected) exposure to other viral communicable diseases: Secondary | ICD-10-CM | POA: Diagnosis not present

## 2020-02-26 DIAGNOSIS — N529 Male erectile dysfunction, unspecified: Secondary | ICD-10-CM | POA: Diagnosis not present

## 2020-02-26 DIAGNOSIS — J302 Other seasonal allergic rhinitis: Secondary | ICD-10-CM | POA: Diagnosis not present

## 2020-02-26 DIAGNOSIS — Z139 Encounter for screening, unspecified: Secondary | ICD-10-CM | POA: Diagnosis not present

## 2020-02-26 DIAGNOSIS — E7800 Pure hypercholesterolemia, unspecified: Secondary | ICD-10-CM | POA: Diagnosis not present

## 2020-02-26 DIAGNOSIS — F5101 Primary insomnia: Secondary | ICD-10-CM | POA: Diagnosis not present

## 2020-02-26 DIAGNOSIS — Z9181 History of falling: Secondary | ICD-10-CM | POA: Diagnosis not present

## 2020-02-26 DIAGNOSIS — E349 Endocrine disorder, unspecified: Secondary | ICD-10-CM | POA: Diagnosis not present

## 2020-02-26 DIAGNOSIS — F3342 Major depressive disorder, recurrent, in full remission: Secondary | ICD-10-CM | POA: Diagnosis not present

## 2020-02-26 DIAGNOSIS — F419 Anxiety disorder, unspecified: Secondary | ICD-10-CM | POA: Diagnosis not present

## 2020-02-26 DIAGNOSIS — K219 Gastro-esophageal reflux disease without esophagitis: Secondary | ICD-10-CM | POA: Diagnosis not present

## 2020-02-26 DIAGNOSIS — R3129 Other microscopic hematuria: Secondary | ICD-10-CM | POA: Diagnosis not present

## 2020-02-26 DIAGNOSIS — N400 Enlarged prostate without lower urinary tract symptoms: Secondary | ICD-10-CM | POA: Diagnosis not present

## 2020-03-13 DIAGNOSIS — M5117 Intervertebral disc disorders with radiculopathy, lumbosacral region: Secondary | ICD-10-CM | POA: Diagnosis not present

## 2020-03-13 DIAGNOSIS — M5114 Intervertebral disc disorders with radiculopathy, thoracic region: Secondary | ICD-10-CM | POA: Diagnosis not present

## 2020-03-13 DIAGNOSIS — M5416 Radiculopathy, lumbar region: Secondary | ICD-10-CM | POA: Diagnosis not present

## 2020-03-13 DIAGNOSIS — M5414 Radiculopathy, thoracic region: Secondary | ICD-10-CM | POA: Diagnosis not present

## 2020-03-17 DIAGNOSIS — M1612 Unilateral primary osteoarthritis, left hip: Secondary | ICD-10-CM | POA: Diagnosis not present

## 2020-03-17 DIAGNOSIS — M5136 Other intervertebral disc degeneration, lumbar region: Secondary | ICD-10-CM | POA: Diagnosis not present

## 2020-03-29 DIAGNOSIS — K219 Gastro-esophageal reflux disease without esophagitis: Secondary | ICD-10-CM | POA: Diagnosis not present

## 2020-03-29 DIAGNOSIS — F5101 Primary insomnia: Secondary | ICD-10-CM | POA: Diagnosis not present

## 2020-03-29 DIAGNOSIS — E349 Endocrine disorder, unspecified: Secondary | ICD-10-CM | POA: Diagnosis not present

## 2020-03-29 DIAGNOSIS — E7800 Pure hypercholesterolemia, unspecified: Secondary | ICD-10-CM | POA: Diagnosis not present

## 2020-03-29 DIAGNOSIS — N400 Enlarged prostate without lower urinary tract symptoms: Secondary | ICD-10-CM | POA: Diagnosis not present

## 2020-03-29 DIAGNOSIS — E118 Type 2 diabetes mellitus with unspecified complications: Secondary | ICD-10-CM | POA: Diagnosis not present

## 2020-03-29 DIAGNOSIS — K589 Irritable bowel syndrome without diarrhea: Secondary | ICD-10-CM | POA: Diagnosis not present

## 2020-03-29 DIAGNOSIS — I10 Essential (primary) hypertension: Secondary | ICD-10-CM | POA: Diagnosis not present

## 2020-03-29 DIAGNOSIS — R3129 Other microscopic hematuria: Secondary | ICD-10-CM | POA: Diagnosis not present

## 2020-03-29 DIAGNOSIS — M545 Low back pain, unspecified: Secondary | ICD-10-CM | POA: Diagnosis not present

## 2020-03-29 DIAGNOSIS — N529 Male erectile dysfunction, unspecified: Secondary | ICD-10-CM | POA: Diagnosis not present

## 2020-03-29 DIAGNOSIS — F419 Anxiety disorder, unspecified: Secondary | ICD-10-CM | POA: Diagnosis not present

## 2020-03-29 DIAGNOSIS — J302 Other seasonal allergic rhinitis: Secondary | ICD-10-CM | POA: Diagnosis not present

## 2020-03-29 DIAGNOSIS — F3342 Major depressive disorder, recurrent, in full remission: Secondary | ICD-10-CM | POA: Diagnosis not present

## 2020-03-31 DIAGNOSIS — M25552 Pain in left hip: Secondary | ICD-10-CM | POA: Diagnosis not present

## 2020-05-15 ENCOUNTER — Other Ambulatory Visit (HOSPITAL_COMMUNITY): Payer: Self-pay | Admitting: Neurological Surgery

## 2020-05-15 ENCOUNTER — Other Ambulatory Visit: Payer: Self-pay | Admitting: Neurological Surgery

## 2020-05-15 DIAGNOSIS — M4714 Other spondylosis with myelopathy, thoracic region: Secondary | ICD-10-CM

## 2020-05-23 ENCOUNTER — Other Ambulatory Visit: Payer: Self-pay | Admitting: Neurological Surgery

## 2020-05-23 ENCOUNTER — Encounter: Payer: Self-pay | Admitting: Gastroenterology

## 2020-05-29 ENCOUNTER — Other Ambulatory Visit: Payer: Self-pay

## 2020-05-29 ENCOUNTER — Ambulatory Visit (HOSPITAL_COMMUNITY)
Admission: RE | Admit: 2020-05-29 | Discharge: 2020-05-29 | Disposition: A | Discharge status: Home / Self Care | Payer: Medicare Other | Source: Ambulatory Visit | Attending: Neurological Surgery | Admitting: Neurological Surgery

## 2020-05-29 DIAGNOSIS — M4714 Other spondylosis with myelopathy, thoracic region: Secondary | ICD-10-CM | POA: Diagnosis present

## 2020-06-20 NOTE — Progress Notes (Signed)
Surgical Instructions   Your procedure is scheduled on Thursday, March 17th.  Report to [facility] Main Entrance "A" at 7:00 A.M., then check in with the Admitting office.  Call this number if you have problems the morning of surgery:  [phone]   If you have any questions prior to your surgery date call [phone]: Open Monday-Friday 8am-4pm   Remember:  Do not eat or drink after midnight the night before your surgery   Take these medicines the morning of surgery with A SIP OF WATER  atorvastatin (LIPITOR)  dicyclomine (BENTYL) doxazosin (CARDURA) RABEprazole (ACIPHEX)   If needed: HYDROcodone-acetaminophen (NORCO/VICODIN), traMADol (ULTRAM), Eye drops  As of today, STOP taking meloxicam (MOBIC), any Aspirin (unless otherwise instructed by your surgeon) Aleve, Naproxen, Ibuprofen, Motrin, Advil, Goody's, BC's, all herbal medications, fish oil, and all vitamins.                     Do not wear jewelry.            Do not wear lotions, powders, colognes, or deodorant.            Men may shave face and neck.            Do not bring valuables to the hospital.            [facility] is not responsible for any belongings or valuables.  Do NOT Smoke (Tobacco/Vaping) or drink Alcohol 24 hours prior to your procedure If you use a CPAP at night, you may bring all equipment for your overnight stay.   Contacts, glasses, dentures or bridgework may not be worn into surgery, please bring cases for these belongings   For patients admitted to the hospital, discharge time will be determined by your treatment team.   Patients discharged the day of surgery will not be allowed to drive home, and someone needs to stay with them for 24 hours.  Special instructions:   [place]- Preparing For Surgery  Before surgery, you can play an important role. Because skin is not sterile, your skin needs to be as free of germs as possible. You can reduce the number of germs on your skin by washing with  CHG (chlorahexidine gluconate) Soap before surgery.  CHG is an antiseptic cleaner which kills germs and bonds with the skin to continue killing germs even after washing.    Oral Hygiene is also important to reduce your risk of infection.  Remember - BRUSH YOUR TEETH THE MORNING OF SURGERY WITH YOUR REGULAR TOOTHPASTE  Please do not use if you have an allergy to CHG or antibacterial soaps. If your skin becomes reddened/irritated stop using the CHG.  Do not shave (including legs and underarms) for at least 48 hours prior to first CHG shower. It is OK to shave your face.  Please follow these instructions carefully.   1. Shower the NIGHT BEFORE SURGERY and the MORNING OF SURGERY  2. If you chose to wash your hair, wash your hair first as usual with your normal shampoo. After you shampoo, rinse your hair and body thoroughly to remove the shampoo.  3. Wash Face and genitals (private parts) with your normal soap.   4. Use CHG Soap as you would any other liquid soap. You can apply CHG directly to the skin and wash gently with a scrungie or a clean washcloth.   5. Apply the CHG Soap to your body ONLY FROM THE NECK DOWN.  Do not use on open  wounds or open sores. Avoid contact with your eyes, ears, mouth and genitals (private parts). Wash Face and genitals (private parts)  with your normal soap.   6. Wash thoroughly, paying special attention to the area where your surgery will be performed.  7. Thoroughly rinse your body with warm water from the neck down.  8. DO NOT shower/wash with your normal soap after using and rinsing off the CHG Soap.  9. Pat yourself dry with a CLEAN TOWEL.  10. Wear CLEAN PAJAMAS to bed the night before surgery  11. Place CLEAN SHEETS on your bed the night before your surgery  12. DO NOT SLEEP WITH PETS.  Day of Surgery: Shower with CHG soap Wear Clean/Comfortable clothing the morning of surgery Do not apply any deodorants/lotions.   Remember to brush your teeth  WITH YOUR REGULAR TOOTHPASTE.   Please read over the following fact sheets that you were given.

## 2020-06-23 ENCOUNTER — Other Ambulatory Visit (HOSPITAL_COMMUNITY): Payer: Medicare Other

## 2020-06-23 ENCOUNTER — Encounter (HOSPITAL_COMMUNITY): Payer: Self-pay

## 2020-06-23 ENCOUNTER — Encounter (HOSPITAL_COMMUNITY)
Admission: RE | Admit: 2020-06-23 | Discharge: 2020-06-23 | Disposition: A | Discharge status: Home / Self Care | Payer: Medicare Other | Source: Ambulatory Visit | Attending: Neurological Surgery | Admitting: Neurological Surgery

## 2020-06-23 ENCOUNTER — Other Ambulatory Visit: Payer: Self-pay

## 2020-06-23 DIAGNOSIS — Z01818 Encounter for other preprocedural examination: Secondary | ICD-10-CM | POA: Insufficient documentation

## 2020-06-23 DIAGNOSIS — M4714 Other spondylosis with myelopathy, thoracic region: Secondary | ICD-10-CM | POA: Insufficient documentation

## 2020-06-23 DIAGNOSIS — Z20822 Contact with and (suspected) exposure to covid-19: Secondary | ICD-10-CM | POA: Insufficient documentation

## 2020-06-23 DIAGNOSIS — Z79899 Other long term (current) drug therapy: Secondary | ICD-10-CM | POA: Insufficient documentation

## 2020-06-23 DIAGNOSIS — I10 Essential (primary) hypertension: Secondary | ICD-10-CM | POA: Insufficient documentation

## 2020-06-23 LAB — TYPE AND SCREEN
ABO/RH(D): A POS
Antibody Screen: NEGATIVE

## 2020-06-23 LAB — CBC
HCT: 42.3 % (ref 39.0–52.0)
Hemoglobin: 13.8 g/dL (ref 13.0–17.0)
MCH: 33.4 pg (ref 26.0–34.0)
MCHC: 32.6 g/dL (ref 30.0–36.0)
MCV: 102.4 fL — ABNORMAL HIGH (ref 80.0–100.0)
Platelets: 205 K/uL (ref 150–400)
RBC: 4.13 MIL/uL — ABNORMAL LOW (ref 4.22–5.81)
RDW: 13.4 % (ref 11.5–15.5)
WBC: 6.5 K/uL (ref 4.0–10.5)
nRBC: 0 % (ref 0.0–0.2)

## 2020-06-23 LAB — SURGICAL PCR SCREEN
MRSA, PCR: NEGATIVE
Staphylococcus aureus: POSITIVE — AB

## 2020-06-23 LAB — BASIC METABOLIC PANEL
Anion gap: 8 (ref 5–15)
BUN: 21 mg/dL (ref 8–23)
CO2: 24 mmol/L (ref 22–32)
Calcium: 9.6 mg/dL (ref 8.9–10.3)
Chloride: 103 mmol/L (ref 98–111)
Creatinine, Ser: 1.17 mg/dL (ref 0.61–1.24)
GFR, Estimated: >60 mL/min (ref >60)
Glucose, Bld: 88 mg/dL (ref 70–99)
Potassium: 5.1 mmol/L (ref 3.5–5.1)
Sodium: 135 mmol/L (ref 135–145)

## 2020-06-23 LAB — SARS CORONAVIRUS 2 (TAT 6-24 HRS): SARS Coronavirus 2: NEGATIVE

## 2020-06-23 NOTE — Progress Notes (Signed)
PCP - Dr. Cher Nakai Cardiologist - denies  PPM/ICD - denies   Chest x-ray - N/A EKG - 06/23/2020 Stress Test - per patient about 10 or more years ago, nothing was wrong with it, results were fine. ECHO - denies Cardiac Cath - denies   Sleep Study - denies CPAP - N/A  DM: denies  Blood Thinner Instructions: N/A Aspirin Instructions: N/A  ERAS Protcol - No  COVID TEST- @PAT  appointment 06/23/2020, test results pending. Patient verbalized understanding of self-quarantine instructions.  Anesthesia review: YES, records requested from PCP's office, abnormal EKG  Patient denies shortness of breath, fever, cough and chest pain at PAT appointment   All instructions explained to the patient, with a verbal understanding of the material. Patient agrees to go over the instructions while at home for a better understanding. Patient also instructed to self quarantine after being tested for COVID-19. The opportunity to ask questions was provided.

## 2020-06-25 NOTE — Anesthesia Preprocedure Evaluation (Addendum)
Anesthesia Evaluation  Patient identified by MRN, date of birth, ID band Patient awake    Reviewed: Allergy & Precautions, H&P , NPO status , Patient's Chart, lab work & pertinent test results  Airway Mallampati: II   Neck ROM: full    Dental   Pulmonary neg pulmonary ROS,    breath sounds clear to auscultation       Cardiovascular hypertension,  Rhythm:regular Rate:Normal     Neuro/Psych    GI/Hepatic GERD  ,  Endo/Other    Renal/GU stones     Musculoskeletal  (+) Arthritis ,   Abdominal   Peds  Hematology   Anesthesia Other Findings   Reproductive/Obstetrics                            Anesthesia Physical Anesthesia Plan  ASA: II  Anesthesia Plan: General   Post-op Pain Management:    Induction: Intravenous  PONV Risk Score and Plan: 2 and Ondansetron, Dexamethasone and Treatment may vary due to age or medical condition  Airway Management Planned: Oral ETT  Additional Equipment: Arterial line  Intra-op Plan:   Post-operative Plan: Extubation in OR  Informed Consent: I have reviewed the patients History and Physical, chart, labs and discussed the procedure including the risks, benefits and alternatives for the proposed anesthesia with the patient or authorized representative who has indicated his/her understanding and acceptance.     Dental advisory given  Plan Discussed with: CRNA, Anesthesiologist and Surgeon  Anesthesia Plan Comments: (See APP note by Durel Salts, FNP )       Anesthesia Quick Evaluation

## 2020-06-25 NOTE — Progress Notes (Signed)
Anesthesia Chart Review:   Case: [id number] Date/Time: 06/26/20 0934   Procedures:      Thoracic 10-11, Thoracic 11-12, Thoracic 12-Lumbar 1 Laminectomy/foraminotomy/posterior fixation with augmented pedicle screws from Thoracic 10 to Lumbar 2 with posterior arthrodesis, allograft, robot assist (N/A Back)     APPLICATION OF ROBOTIC ASSISTANCE FOR SPINAL PROCEDURE (N/A )   Anesthesia type: General   Pre-op diagnosis: Thoracic spondylosis with myelopathy   Location: [facility] OR ROOM 10 / [place] [facility]   Surgeons: Kristeen Miss, MD      DISCUSSION:  Pt is 80 years old with hx HTN   VS: BP (!) 143/71   Pulse 81   Temp 36.4 C (Oral)   Resp 18   Ht 5\' 10"  (1.778 m)   Wt 86 kg   SpO2 100%   BMI 27.22 kg/m   PROVIDERS: - PCP is Cher Nakai, MD   LABS: Labs reviewed: Acceptable for surgery. (all labs ordered are listed, but only abnormal results are displayed)  Labs Reviewed  SURGICAL PCR SCREEN - Abnormal; Notable for the following components:      Result Value   Staphylococcus aureus POSITIVE (*)    All other components within normal limits  CBC - Abnormal; Notable for the following components:   RBC 4.13 (*)    MCV 102.4 (*)    All other components within normal limits  SARS CORONAVIRUS 2 (TAT 6-24 HRS)  BASIC METABOLIC PANEL  TYPE AND SCREEN     EKG 06/23/20: NSR. LAFB. RBBB. Bifasicular block new since 2015; will route EKG to pcp for follow up.    CV: N/A   Past Medical History:  Diagnosis Date  . Arthritis   . 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 GERD (gastroesophageal reflux disease) Hx in past.  . History of kidney stones 12/14   Lithotrispy  . Hypertension     Past Surgical History:  Procedure Laterality Date  . ANTERIOR CERVICAL DECOMPRESSION/DISCECTOMY FUSION 4 LEVELS N/A 06/30/2012   Procedure: ANTERIOR CERVICAL DECOMPRESSION/DISCECTOMY FUSION 4 LEVELS;  Surgeon: Kristeen Miss, MD;  Location: [place] [facility];  Service: Neurosurgery;  Laterality:  N/A;  C3-4 C4-5 C5-6 C6-7 Anterior cervical decompression/diskectomy/fusion  . BACK SURGERY  08/2010   lumbar fusion  . CATARACT EXTRACTION, BILATERAL    . CHOLECYSTECTOMY  1990's  . NECK SURGERY     x2  . sinus surgeries     x2  . SKIN CANCER EXCISION     arm chest and head basal cell  . TRANSURETHRAL RESECTION OF PROSTATE  1999    MEDICATIONS: . atorvastatin (LIPITOR) 40 MG tablet  . dicyclomine (BENTYL) 10 MG capsule  . doxazosin (CARDURA) 4 MG tablet  . glucosamine-chondroitin 500-400 MG tablet  . guaiFENesin (MUCINEX) 600 MG 12 hr tablet  . HYDROcodone-acetaminophen (NORCO/VICODIN) 5-325 MG per tablet  . lisinopril (PRINIVIL,ZESTRIL) 20 MG tablet  . meloxicam (MOBIC) 7.5 MG tablet  . Omega-3 Fatty Acids (SALMON OIL PO)  . Polyethyl Glycol-Propyl Glycol 0.4-0.3 % SOLN  . Probiotic Product (PRO-BIOTIC BLEND PO)  . RABEprazole (ACIPHEX) 20 MG tablet  . Simethicone (GAS-X PO)  . testosterone cypionate (DEPOTESTOTERONE CYPIONATE) 100 MG/ML injection  . traMADol (ULTRAM) 50 MG tablet   No current facility-administered medications for this encounter.    If no changes, I anticipate pt can proceed with surgery as scheduled.   Willeen Cass, PhD, FNP-BC [facility]/Anesthesiology Phone: [phone] 06/25/2020 12:27 PM

## 2020-06-26 ENCOUNTER — Inpatient Hospital Stay (HOSPITAL_COMMUNITY): Payer: Medicare Other | Admitting: Emergency Medicine

## 2020-06-26 ENCOUNTER — Inpatient Hospital Stay (HOSPITAL_COMMUNITY)
Admission: RE | Admit: 2020-06-26 | Discharge: 2020-06-28 | DRG: 455 | Disposition: A | Discharge status: Home / Self Care | Payer: Medicare Other | Attending: Neurological Surgery | Admitting: Neurological Surgery

## 2020-06-26 ENCOUNTER — Encounter (HOSPITAL_COMMUNITY): Payer: Self-pay | Admitting: Neurological Surgery

## 2020-06-26 ENCOUNTER — Inpatient Hospital Stay (HOSPITAL_COMMUNITY)
Admission: RE | Disposition: A | Discharge status: Home / Self Care | Payer: Self-pay | Source: Home / Self Care | Attending: Neurological Surgery

## 2020-06-26 ENCOUNTER — Inpatient Hospital Stay (HOSPITAL_COMMUNITY): Payer: Medicare Other | Admitting: Certified Registered Nurse Anesthetist

## 2020-06-26 ENCOUNTER — Inpatient Hospital Stay (HOSPITAL_COMMUNITY): Payer: Medicare Other

## 2020-06-26 ENCOUNTER — Other Ambulatory Visit: Payer: Self-pay

## 2020-06-26 DIAGNOSIS — Z79899 Other long term (current) drug therapy: Secondary | ICD-10-CM | POA: Diagnosis not present

## 2020-06-26 DIAGNOSIS — Z791 Long term (current) use of non-steroidal anti-inflammatories (NSAID): Secondary | ICD-10-CM

## 2020-06-26 DIAGNOSIS — Z8249 Family history of ischemic heart disease and other diseases of the circulatory system: Secondary | ICD-10-CM | POA: Diagnosis not present

## 2020-06-26 DIAGNOSIS — Z886 Allergy status to analgesic agent status: Secondary | ICD-10-CM

## 2020-06-26 DIAGNOSIS — M47894 Other spondylosis, thoracic region: Secondary | ICD-10-CM | POA: Diagnosis present

## 2020-06-26 DIAGNOSIS — Z87442 Personal history of urinary calculi: Secondary | ICD-10-CM | POA: Diagnosis not present

## 2020-06-26 DIAGNOSIS — Z79890 Hormone replacement therapy (postmenopausal): Secondary | ICD-10-CM | POA: Diagnosis not present

## 2020-06-26 DIAGNOSIS — Z20822 Contact with and (suspected) exposure to covid-19: Secondary | ICD-10-CM | POA: Diagnosis present

## 2020-06-26 DIAGNOSIS — M5104 Intervertebral disc disorders with myelopathy, thoracic region: Principal | ICD-10-CM | POA: Diagnosis present

## 2020-06-26 DIAGNOSIS — Z419 Encounter for procedure for purposes other than remedying health state, unspecified: Secondary | ICD-10-CM

## 2020-06-26 DIAGNOSIS — M810 Age-related osteoporosis without current pathological fracture: Secondary | ICD-10-CM | POA: Diagnosis present

## 2020-06-26 DIAGNOSIS — I10 Essential (primary) hypertension: Secondary | ICD-10-CM | POA: Diagnosis present

## 2020-06-26 DIAGNOSIS — M5105 Intervertebral disc disorders with myelopathy, thoracolumbar region: Secondary | ICD-10-CM | POA: Diagnosis present

## 2020-06-26 DIAGNOSIS — Z9889 Other specified postprocedural states: Secondary | ICD-10-CM

## 2020-06-26 DIAGNOSIS — M1990 Unspecified osteoarthritis, unspecified site: Secondary | ICD-10-CM | POA: Diagnosis present

## 2020-06-26 DIAGNOSIS — Z85828 Personal history of other malignant neoplasm of skin: Secondary | ICD-10-CM

## 2020-06-26 DIAGNOSIS — K219 Gastro-esophageal reflux disease without esophagitis: Secondary | ICD-10-CM | POA: Diagnosis present

## 2020-06-26 DIAGNOSIS — M549 Dorsalgia, unspecified: Secondary | ICD-10-CM | POA: Diagnosis present

## 2020-06-26 HISTORY — PX: APPLICATION OF ROBOTIC ASSISTANCE FOR SPINAL PROCEDURE: SHX6753

## 2020-06-26 SURGERY — POSTERIOR LUMBAR FUSION 3 LEVEL
Anesthesia: General | Site: Back

## 2020-06-26 MED ORDER — BISACODYL 10 MG RE SUPP: 10.0000 mg | Freq: Every day | RECTAL | Status: DC | PRN

## 2020-06-26 MED ORDER — ONDANSETRON HCL 4 MG/2ML IJ SOLN
INTRAMUSCULAR | Status: AC
  Filled 2020-06-26: qty 2

## 2020-06-26 MED ORDER — DOCUSATE SODIUM 100 MG PO CAPS
100.0000 mg | ORAL_CAPSULE | Freq: Two times a day (BID) | ORAL | Status: DC
  Administered 2020-06-26 – 2020-06-27 (×3): 100 mg via ORAL
  Filled 2020-06-26 (×3): qty 1

## 2020-06-26 MED ORDER — CHLORHEXIDINE GLUCONATE CLOTH 2 % EX PADS: 6.0000 | MEDICATED_PAD | Freq: Once | CUTANEOUS | Status: DC

## 2020-06-26 MED ORDER — POLYETHYLENE GLYCOL 3350 17 G PO PACK
17.0000 g | PACK | Freq: Every day | ORAL | Status: DC | PRN
  Administered 2020-06-27: 17 g via ORAL
  Filled 2020-06-26: qty 1

## 2020-06-26 MED ORDER — DEXAMETHASONE SODIUM PHOSPHATE 10 MG/ML IJ SOLN
INTRAMUSCULAR | Status: AC
  Filled 2020-06-26: qty 1

## 2020-06-26 MED ORDER — HYDROCODONE-ACETAMINOPHEN 5-325 MG PO TABS
1.0000 | ORAL_TABLET | ORAL | Status: DC | PRN
  Administered 2020-06-26 – 2020-06-27 (×2): 2 via ORAL
  Administered 2020-06-27: 1 via ORAL
  Administered 2020-06-27: 2 via ORAL
  Administered 2020-06-28 (×2): 1 via ORAL
  Filled 2020-06-26: qty 2
  Filled 2020-06-26 (×3): qty 1
  Filled 2020-06-26 (×2): qty 2

## 2020-06-26 MED ORDER — ONDANSETRON HCL 4 MG/2ML IJ SOLN
INTRAMUSCULAR | Status: DC | PRN
  Administered 2020-06-26: 4 mg via INTRAVENOUS

## 2020-06-26 MED ORDER — ONDANSETRON HCL 4 MG PO TABS: 4.0000 mg | ORAL_TABLET | Freq: Four times a day (QID) | ORAL | Status: DC | PRN

## 2020-06-26 MED ORDER — SODIUM CHLORIDE 0.9% FLUSH: 3.0000 mL | INTRAVENOUS | Status: DC | PRN

## 2020-06-26 MED ORDER — PHENYLEPHRINE 40 MCG/ML (10ML) SYRINGE FOR IV PUSH (FOR BLOOD PRESSURE SUPPORT)
PREFILLED_SYRINGE | INTRAVENOUS | Status: AC
  Filled 2020-06-26: qty 10

## 2020-06-26 MED ORDER — SODIUM CHLORIDE 0.9 % IV SOLN
250.0000 mL | INTRAVENOUS | Status: DC
  Administered 2020-06-26: 250 mL via INTRAVENOUS

## 2020-06-26 MED ORDER — PHENYLEPHRINE HCL-NACL 10-0.9 MG/250ML-% IV SOLN
INTRAVENOUS | Status: DC | PRN
  Administered 2020-06-26: 120 ug/min via INTRAVENOUS
  Administered 2020-06-26 (×2): 80 ug/min via INTRAVENOUS
  Administered 2020-06-26: 20 ug/min via INTRAVENOUS

## 2020-06-26 MED ORDER — ALBUMIN HUMAN 5 % IV SOLN: INTRAVENOUS | Status: DC | PRN

## 2020-06-26 MED ORDER — LIDOCAINE-EPINEPHRINE 1 %-1:100000 IJ SOLN
INTRAMUSCULAR | Status: AC
  Filled 2020-06-26: qty 1

## 2020-06-26 MED ORDER — THROMBIN 5000 UNITS EX KIT
PACK | CUTANEOUS | Status: AC
  Filled 2020-06-26: qty 1

## 2020-06-26 MED ORDER — ALUM & MAG HYDROXIDE-SIMETH 200-200-20 MG/5ML PO SUSP: 30.0000 mL | Freq: Four times a day (QID) | ORAL | Status: DC | PRN

## 2020-06-26 MED ORDER — SUCCINYLCHOLINE CHLORIDE 200 MG/10ML IV SOSY
PREFILLED_SYRINGE | INTRAVENOUS | Status: DC | PRN
  Administered 2020-06-26: 120 mg via INTRAVENOUS

## 2020-06-26 MED ORDER — PROPOFOL 10 MG/ML IV BOLUS
INTRAVENOUS | Status: DC | PRN
  Administered 2020-06-26: 30 mg via INTRAVENOUS
  Administered 2020-06-26: 170 mg via INTRAVENOUS

## 2020-06-26 MED ORDER — 0.9 % SODIUM CHLORIDE (POUR BTL) OPTIME
TOPICAL | Status: DC | PRN
  Administered 2020-06-26 (×2): 1000 mL

## 2020-06-26 MED ORDER — SODIUM CHLORIDE 0.9% FLUSH
3.0000 mL | Freq: Two times a day (BID) | INTRAVENOUS | Status: DC
  Administered 2020-06-26 – 2020-06-27 (×2): 3 mL via INTRAVENOUS

## 2020-06-26 MED ORDER — FLEET ENEMA 7-19 GM/118ML RE ENEM: 1.0000 | ENEMA | Freq: Once | RECTAL | Status: DC | PRN

## 2020-06-26 MED ORDER — LACTATED RINGERS IV SOLN: INTRAVENOUS | Status: DC

## 2020-06-26 MED ORDER — LIDOCAINE 2% (20 MG/ML) 5 ML SYRINGE
INTRAMUSCULAR | Status: AC
  Filled 2020-06-26: qty 5

## 2020-06-26 MED ORDER — OXYCODONE HCL 5 MG PO TABS: 5.0000 mg | ORAL_TABLET | Freq: Once | ORAL | Status: DC | PRN

## 2020-06-26 MED ORDER — BUPIVACAINE HCL (PF) 0.5 % IJ SOLN
INTRAMUSCULAR | Status: AC
  Filled 2020-06-26: qty 30

## 2020-06-26 MED ORDER — SIMETHICONE 80 MG PO CHEW: 80.0000 mg | CHEWABLE_TABLET | Freq: Four times a day (QID) | ORAL | Status: DC | PRN

## 2020-06-26 MED ORDER — KETOROLAC TROMETHAMINE 15 MG/ML IJ SOLN
7.5000 mg | Freq: Four times a day (QID) | INTRAMUSCULAR | Status: AC
  Administered 2020-06-26 – 2020-06-27 (×4): 7.5 mg via INTRAVENOUS
  Filled 2020-06-26 (×4): qty 1

## 2020-06-26 MED ORDER — EPHEDRINE 5 MG/ML INJ
INTRAVENOUS | Status: AC
  Filled 2020-06-26: qty 20

## 2020-06-26 MED ORDER — LISINOPRIL 20 MG PO TABS
20.0000 mg | ORAL_TABLET | Freq: Every evening | ORAL | Status: DC
  Administered 2020-06-26 – 2020-06-27 (×2): 20 mg via ORAL
  Filled 2020-06-26: qty 1

## 2020-06-26 MED ORDER — LIDOCAINE 2% (20 MG/ML) 5 ML SYRINGE
INTRAMUSCULAR | Status: DC | PRN
  Administered 2020-06-26: 60 mg via INTRAVENOUS

## 2020-06-26 MED ORDER — THROMBIN 20000 UNITS EX SOLR
CUTANEOUS | Status: AC
  Filled 2020-06-26: qty 20000

## 2020-06-26 MED ORDER — MORPHINE SULFATE (PF) 2 MG/ML IV SOLN
2.0000 mg | INTRAVENOUS | Status: DC | PRN
  Administered 2020-06-26: 2 mg via INTRAVENOUS
  Filled 2020-06-26: qty 1

## 2020-06-26 MED ORDER — POLYVINYL ALCOHOL 1.4 % OP SOLN
1.0000 [drp] | Freq: Two times a day (BID) | OPHTHALMIC | Status: DC
  Administered 2020-06-26 – 2020-06-27 (×3): 1 [drp] via OPHTHALMIC
  Filled 2020-06-26: qty 15

## 2020-06-26 MED ORDER — MENTHOL 3 MG MT LOZG: 1.0000 | LOZENGE | OROMUCOSAL | Status: DC | PRN

## 2020-06-26 MED ORDER — OXYCODONE HCL 5 MG/5ML PO SOLN
5.0000 mg | Freq: Once | ORAL | Status: DC | PRN
Start: 2020-06-26 — End: 2020-06-26

## 2020-06-26 MED ORDER — ACETAMINOPHEN 650 MG RE SUPP: 650.0000 mg | RECTAL | Status: DC | PRN

## 2020-06-26 MED ORDER — FENTANYL CITRATE (PF) 250 MCG/5ML IJ SOLN
INTRAMUSCULAR | Status: AC
  Filled 2020-06-26: qty 5

## 2020-06-26 MED ORDER — GUAIFENESIN ER 600 MG PO TB12
600.0000 mg | ORAL_TABLET | Freq: Every day | ORAL | Status: DC
Start: 2020-06-26 — End: 2020-06-28
  Administered 2020-06-26 – 2020-06-27 (×2): 600 mg via ORAL
  Filled 2020-06-26: qty 1

## 2020-06-26 MED ORDER — LIDOCAINE-EPINEPHRINE 1 %-1:100000 IJ SOLN
INTRAMUSCULAR | Status: DC | PRN
  Administered 2020-06-26: 5 mL

## 2020-06-26 MED ORDER — DOXAZOSIN MESYLATE 8 MG PO TABS
4.0000 mg | ORAL_TABLET | Freq: Every day | ORAL | Status: DC
  Administered 2020-06-27: 4 mg via ORAL
  Filled 2020-06-26: qty 1

## 2020-06-26 MED ORDER — SUCCINYLCHOLINE CHLORIDE 200 MG/10ML IV SOSY
PREFILLED_SYRINGE | INTRAVENOUS | Status: AC
  Filled 2020-06-26: qty 10

## 2020-06-26 MED ORDER — TRAMADOL HCL 50 MG PO TABS
50.0000 mg | ORAL_TABLET | Freq: Four times a day (QID) | ORAL | Status: DC | PRN
  Administered 2020-06-27: 50 mg via ORAL
  Filled 2020-06-26 (×2): qty 1

## 2020-06-26 MED ORDER — FENTANYL CITRATE (PF) 250 MCG/5ML IJ SOLN
INTRAMUSCULAR | Status: DC | PRN
  Administered 2020-06-26 (×3): 25 ug via INTRAVENOUS
  Administered 2020-06-26: 50 ug via INTRAVENOUS
  Administered 2020-06-26: 100 ug via INTRAVENOUS

## 2020-06-26 MED ORDER — CHLORHEXIDINE GLUCONATE 0.12 % MT SOLN
15.0000 mL | Freq: Once | OROMUCOSAL | Status: AC
  Administered 2020-06-26: 15 mL via OROMUCOSAL
  Filled 2020-06-26: qty 15

## 2020-06-26 MED ORDER — POLYETHYL GLYCOL-PROPYL GLYCOL 0.4-0.3 % OP SOLN: 1.0000 [drp] | Freq: Two times a day (BID) | OPHTHALMIC | Status: DC

## 2020-06-26 MED ORDER — ORAL CARE MOUTH RINSE: 15.0000 mL | Freq: Once | OROMUCOSAL | Status: AC

## 2020-06-26 MED ORDER — KETAMINE HCL 50 MG/5ML IJ SOSY
PREFILLED_SYRINGE | INTRAMUSCULAR | Status: AC
  Filled 2020-06-26: qty 5

## 2020-06-26 MED ORDER — PHENYLEPHRINE 40 MCG/ML (10ML) SYRINGE FOR IV PUSH (FOR BLOOD PRESSURE SUPPORT)
PREFILLED_SYRINGE | INTRAVENOUS | Status: DC | PRN
  Administered 2020-06-26 (×3): 120 ug via INTRAVENOUS
  Administered 2020-06-26: 160 ug via INTRAVENOUS
  Administered 2020-06-26: 120 ug via INTRAVENOUS

## 2020-06-26 MED ORDER — PROPOFOL 500 MG/50ML IV EMUL
INTRAVENOUS | Status: DC | PRN
  Administered 2020-06-26: 50 ug/kg/min via INTRAVENOUS
  Administered 2020-06-26: 75 ug/kg/min via INTRAVENOUS
  Administered 2020-06-26: 100 ug/kg/min via INTRAVENOUS

## 2020-06-26 MED ORDER — ONDANSETRON HCL 4 MG/2ML IJ SOLN: 4.0000 mg | Freq: Four times a day (QID) | INTRAMUSCULAR | Status: DC | PRN

## 2020-06-26 MED ORDER — DEXAMETHASONE SODIUM PHOSPHATE 10 MG/ML IJ SOLN
INTRAMUSCULAR | Status: DC | PRN
  Administered 2020-06-26: 10 mg via INTRAVENOUS

## 2020-06-26 MED ORDER — ACETAMINOPHEN 325 MG PO TABS
650.0000 mg | ORAL_TABLET | ORAL | Status: DC | PRN
  Administered 2020-06-27 – 2020-06-28 (×2): 650 mg via ORAL
  Filled 2020-06-26 (×2): qty 2

## 2020-06-26 MED ORDER — METHOCARBAMOL 1000 MG/10ML IJ SOLN
500.0000 mg | Freq: Four times a day (QID) | INTRAVENOUS | Status: DC | PRN
  Filled 2020-06-26: qty 5

## 2020-06-26 MED ORDER — ONDANSETRON HCL 4 MG/2ML IJ SOLN
4.0000 mg | Freq: Four times a day (QID) | INTRAMUSCULAR | Status: DC | PRN
Start: 2020-06-26 — End: 2020-06-26

## 2020-06-26 MED ORDER — CEFAZOLIN SODIUM-DEXTROSE 2-4 GM/100ML-% IV SOLN
2.0000 g | INTRAVENOUS | Status: AC
  Administered 2020-06-26: 2 g via INTRAVENOUS
  Filled 2020-06-26: qty 100

## 2020-06-26 MED ORDER — PHENOL 1.4 % MT LIQD: 1.0000 | OROMUCOSAL | Status: DC | PRN

## 2020-06-26 MED ORDER — THROMBIN 20000 UNITS EX SOLR
CUTANEOUS | Status: DC | PRN
  Administered 2020-06-26: 20 mL via TOPICAL

## 2020-06-26 MED ORDER — FENTANYL CITRATE (PF) 100 MCG/2ML IJ SOLN: 25.0000 ug | INTRAMUSCULAR | Status: DC | PRN

## 2020-06-26 MED ORDER — BUPIVACAINE HCL (PF) 0.5 % IJ SOLN
INTRAMUSCULAR | Status: DC | PRN
  Administered 2020-06-26: 5 mL
  Administered 2020-06-26: 10 mL

## 2020-06-26 MED ORDER — ATORVASTATIN CALCIUM 40 MG PO TABS
40.0000 mg | ORAL_TABLET | Freq: Every day | ORAL | Status: DC
  Administered 2020-06-26 – 2020-06-27 (×2): 40 mg via ORAL
  Filled 2020-06-26: qty 1

## 2020-06-26 MED ORDER — PROPOFOL 10 MG/ML IV BOLUS
INTRAVENOUS | Status: AC
  Filled 2020-06-26: qty 20

## 2020-06-26 MED ORDER — LACTATED RINGERS IV SOLN: INTRAVENOUS | Status: DC | PRN

## 2020-06-26 MED ORDER — KETAMINE HCL 10 MG/ML IJ SOLN
INTRAMUSCULAR | Status: DC | PRN
  Administered 2020-06-26: 20 mg via INTRAVENOUS

## 2020-06-26 MED ORDER — EPHEDRINE SULFATE-NACL 50-0.9 MG/10ML-% IV SOSY
PREFILLED_SYRINGE | INTRAVENOUS | Status: DC | PRN
  Administered 2020-06-26: 5 mg via INTRAVENOUS
  Administered 2020-06-26 (×2): 10 mg via INTRAVENOUS
  Administered 2020-06-26: 5 mg via INTRAVENOUS
  Administered 2020-06-26 (×2): 10 mg via INTRAVENOUS
  Administered 2020-06-26: 5 mg via INTRAVENOUS
  Administered 2020-06-26: 10 mg via INTRAVENOUS
  Administered 2020-06-26: 20 mg via INTRAVENOUS
  Administered 2020-06-26: 10 mg via INTRAVENOUS

## 2020-06-26 MED ORDER — METHOCARBAMOL 500 MG PO TABS
500.0000 mg | ORAL_TABLET | Freq: Four times a day (QID) | ORAL | Status: DC | PRN
  Administered 2020-06-27: 500 mg via ORAL
  Filled 2020-06-26: qty 1

## 2020-06-26 MED ORDER — CEFAZOLIN SODIUM-DEXTROSE 2-4 GM/100ML-% IV SOLN
2.0000 g | Freq: Three times a day (TID) | INTRAVENOUS | Status: AC
  Administered 2020-06-26 – 2020-06-27 (×2): 2 g via INTRAVENOUS
  Filled 2020-06-26 (×2): qty 100

## 2020-06-26 MED ORDER — PANTOPRAZOLE SODIUM 40 MG PO TBEC
40.0000 mg | DELAYED_RELEASE_TABLET | Freq: Every day | ORAL | Status: DC
  Administered 2020-06-27: 40 mg via ORAL
  Filled 2020-06-26: qty 1

## 2020-06-26 MED ORDER — SODIUM CHLORIDE 0.9 % IV SOLN: INTRAVENOUS | Status: DC | PRN

## 2020-06-26 MED ORDER — SENNA 8.6 MG PO TABS
1.0000 | ORAL_TABLET | Freq: Two times a day (BID) | ORAL | Status: DC
  Administered 2020-06-26 – 2020-06-27 (×3): 8.6 mg via ORAL
  Filled 2020-06-26 (×3): qty 1

## 2020-06-26 MED ORDER — THROMBIN 5000 UNITS EX SOLR
OROMUCOSAL | Status: DC | PRN
  Administered 2020-06-26 (×3): 5 mL via TOPICAL

## 2020-06-26 SURGICAL SUPPLY — 93 items
BASKET BONE COLLECTION (BASKET) ×3 IMPLANT
BIT DRILL LONG 3.0X30 (BIT) ×3 IMPLANT
BIT DRILL LONG 3X80 (BIT) IMPLANT
BIT DRILL LONG 4X80 (BIT) IMPLANT
BIT DRILL SHORT 3.0X30 (BIT) IMPLANT
BIT DRILL SHORT 3X80 (BIT) IMPLANT
BLADE CLIPPER SURG (BLADE) ×6 IMPLANT
BLADE SURG 11 STRL SS (BLADE) IMPLANT
BONE CANC CHIPS 20CC PCAN1/4 (Bone Implant) ×3 IMPLANT
BUR ACRON 5.0MM COATED (BURR) ×3 IMPLANT
BUR MATCHSTICK NEURO 3.0 LAGG (BURR) ×3 IMPLANT
CAGE COROENT MP 8X23 (Cage) ×3 IMPLANT
CANISTER SUCT 3000ML PPV (MISCELLANEOUS) ×3 IMPLANT
CEMENT KYPHON C01A KIT/MIXER (Cement) ×3 IMPLANT
CHIPS CANC BONE 20CC PCAN1/4 (Bone Implant) ×2 IMPLANT
CNTNR URN SCR LID CUP LEK RST (MISCELLANEOUS) ×2 IMPLANT
CONT SPEC 4OZ STRL OR WHT (MISCELLANEOUS) ×3
COVER BACK TABLE 60X90IN (DRAPES) ×3 IMPLANT
COVER WAND RF STERILE (DRAPES) IMPLANT
DECANTER SPIKE VIAL GLASS SM (MISCELLANEOUS) ×3 IMPLANT
DERMABOND ADVANCED (GAUZE/BANDAGES/DRESSINGS) ×1
DERMABOND ADVANCED .7 DNX12 (GAUZE/BANDAGES/DRESSINGS) ×2 IMPLANT
DEVICE DISSECT PLASMABLAD 3.0S (MISCELLANEOUS) ×2 IMPLANT
DIGITIZER BENDINI (MISCELLANEOUS) ×3 IMPLANT
DRAPE C-ARM 42X72 X-RAY (DRAPES) ×3 IMPLANT
DRAPE C-ARMOR (DRAPES) ×3 IMPLANT
DRAPE HALF SHEET 40X57 (DRAPES) IMPLANT
DRAPE LAPAROTOMY 100X72X124 (DRAPES) ×3 IMPLANT
DRAPE SHEET LG 3/4 BI-LAMINATE (DRAPES) ×3 IMPLANT
DRAPE WARM FLUID 44X44 (DRAPES) ×3 IMPLANT
DURAPREP 26ML APPLICATOR (WOUND CARE) ×3 IMPLANT
DURASEAL APPLICATOR TIP (TIP) IMPLANT
DURASEAL SPINE SEALANT 3ML (MISCELLANEOUS) IMPLANT
ELECT BLADE 4.0 EZ CLEAN MEGAD (MISCELLANEOUS)
ELECT NVM5 SURFACE MEP/EMG (ELECTRODE) ×3 IMPLANT
ELECT REM PT RETURN 9FT ADLT (ELECTROSURGICAL) ×3
ELECTRODE BLDE 4.0 EZ CLN MEGD (MISCELLANEOUS) IMPLANT
ELECTRODE REM PT RTRN 9FT ADLT (ELECTROSURGICAL) ×2 IMPLANT
GAUZE 4X4 16PLY RFD (DISPOSABLE) IMPLANT
GAUZE SPONGE 4X4 12PLY STRL (GAUZE/BANDAGES/DRESSINGS) ×3 IMPLANT
GLOVE BIOGEL PI IND STRL 8.5 (GLOVE) ×4 IMPLANT
GLOVE BIOGEL PI INDICATOR 8.5 (GLOVE) ×2
GLOVE ECLIPSE 8.5 STRL (GLOVE) ×6 IMPLANT
GLOVE SURG UNDER POLY LF SZ7.5 (GLOVE) ×9 IMPLANT
GOWN STRL REUS W/ TWL LRG LVL3 (GOWN DISPOSABLE) IMPLANT
GOWN STRL REUS W/ TWL XL LVL3 (GOWN DISPOSABLE) ×10 IMPLANT
GOWN STRL REUS W/TWL 2XL LVL3 (GOWN DISPOSABLE) ×6 IMPLANT
GOWN STRL REUS W/TWL LRG LVL3 (GOWN DISPOSABLE)
GOWN STRL REUS W/TWL XL LVL3 (GOWN DISPOSABLE) ×15
GRAFT BONE MAGNIFUSE 1X5 SNGL (Bone Implant) ×3 IMPLANT
GRAFT BONE PROTEIOS LRG 5CC (Orthopedic Implant) ×3 IMPLANT
GUIDEWIRE NITINOL BEVEL TIP (WIRE) ×18 IMPLANT
HEMOSTAT POWDER KIT SURGIFOAM (HEMOSTASIS) ×9 IMPLANT
KIT BASIN OR (CUSTOM PROCEDURE TRAY) ×3 IMPLANT
KIT SPINE MAZOR X ROBO DISP (MISCELLANEOUS) ×3 IMPLANT
KIT TURNOVER KIT B (KITS) ×3 IMPLANT
MILL MEDIUM DISP (BLADE) ×3 IMPLANT
MODULE EMG NEEDLE SSEP NVM5 (NEEDLE) ×3 IMPLANT
NEEDLE HYPO 22GX1.5 SAFETY (NEEDLE) ×3 IMPLANT
NEEDLE RELINE-OR FENS 16 (NEEDLE) ×12 IMPLANT
NS IRRIG 1000ML POUR BTL (IV SOLUTION) ×6 IMPLANT
PACK LAMINECTOMY NEURO (CUSTOM PROCEDURE TRAY) ×3 IMPLANT
PAD ARMBOARD 7.5X6 YLW CONV (MISCELLANEOUS) IMPLANT
PATTIES SURGICAL .5 X1 (DISPOSABLE) ×3 IMPLANT
PIN HEAD 2.5X60MM (PIN) IMPLANT
PLASMABLADE 3.0S (MISCELLANEOUS) ×3
PUSHER RELINE FENS 36 OR (MISCELLANEOUS) ×12 IMPLANT
PUSHER RELINE FENS MAS (MISCELLANEOUS) ×6 IMPLANT
RELINE MAS NEEDLE FENS (NEEDLE) ×6 IMPLANT
ROD RELINE-O 5.5X300 STRT NS (Rod) ×2 IMPLANT
ROD RELINE-O 5.5X300MM STRT (Rod) ×3 IMPLANT
SCREW LOCK RELINE 5.5 TULIP (Screw) ×30 IMPLANT
SCREW RELINE PA 2FC 5.5X45 (Screw) ×12 IMPLANT
SCREW RELINE PA 2FC 5.5X50 (Screw) ×6 IMPLANT
SCREW SCHANZ SA 4.0MM (MISCELLANEOUS) IMPLANT
SPONGE LAP 4X18 RFD (DISPOSABLE) IMPLANT
SPONGE SURGIFOAM ABS GEL 100 (HEMOSTASIS) ×3 IMPLANT
SUT PROLENE 6 0 BV (SUTURE) IMPLANT
SUT VIC AB 1 CT1 18XBRD ANBCTR (SUTURE) ×4 IMPLANT
SUT VIC AB 1 CT1 8-18 (SUTURE) ×6
SUT VIC AB 2-0 CP2 18 (SUTURE) ×6 IMPLANT
SUT VIC AB 3-0 SH 8-18 (SUTURE) ×6 IMPLANT
SUT VIC AB 4-0 RB1 18 (SUTURE) ×9 IMPLANT
SYR 3ML LL SCALE MARK (SYRINGE) ×12 IMPLANT
SYR 5ML LL (SYRINGE) ×3 IMPLANT
SYR TB 1ML LUER SLIP (SYRINGE) ×6 IMPLANT
TAPE CLOTH SURG 4X10 WHT LF (GAUZE/BANDAGES/DRESSINGS) ×3 IMPLANT
TIP FENS REPLACE RELINE (MISCELLANEOUS) ×18 IMPLANT
TOWEL GREEN STERILE (TOWEL DISPOSABLE) ×3 IMPLANT
TOWEL GREEN STERILE FF (TOWEL DISPOSABLE) ×3 IMPLANT
TRAY FOLEY MTR SLVR 16FR STAT (SET/KITS/TRAYS/PACK) ×3 IMPLANT
TUBE MAZOR SA REDUCTION (TUBING) ×3 IMPLANT
WATER STERILE IRR 1000ML POUR (IV SOLUTION) ×3 IMPLANT

## 2020-06-26 NOTE — Progress Notes (Signed)
Pt arrived to the unit. VS stable. Wife at bedside. Belongings at bedside clothes and shoes. Bed in lowest position and call bell within reach.

## 2020-06-26 NOTE — Transfer of Care (Signed)
Immediate Anesthesia Transfer of Care Note  Patient: Daniel Wagner  Procedure(s) Performed: Thoracic ten-eleven, Thoracic eleven-twelve, Thoracic twelve-Lumbar one Laminectomy/foraminotomy/posterior fixation with augmented pedicle screws from Thoracic ten to Thoracic twelve, Thoracic twelve to Lumbar one  Left Transforaminal Lumbar Interbody Fusion, Posterior Lateral Fusion Thoracic ten to Lumbar one with posterior arthrodesis, allograft, autograft and proteios and robot assist (N/A Back) APPLICATION OF ROBOTIC ASSISTANCE FOR SPINAL PROCEDURE (N/A )  Patient Location: PACU  Anesthesia Type:General  Level of Consciousness: drowsy and patient cooperative  Airway & Oxygen Therapy: Patient Spontanous Breathing and Patient connected to face mask oxygen  Post-op Assessment: Report given to RN and Post -op Vital signs reviewed and stable  Post vital signs: Reviewed and stable  Last Vitals:  Vitals Value Taken Time  BP 109/55 06/26/20 1625  Temp    Pulse 86 06/26/20 1632  Resp 14 06/26/20 1632  SpO2 100 % 06/26/20 1632  Vitals shown include unvalidated device data.  Last Pain:  Vitals:   06/26/20 0824  TempSrc:   PainSc: 10-Worst pain ever      Patients Stated Pain Goal: 4 (72/15/87 2761)  Complications: No complications documented.

## 2020-06-26 NOTE — H&P (Addendum)
Daniel Wagner is an 80 y.o. male.   Chief Complaint: Back pain HPI: Daniel Wagner is a 80 year old individual whose had a previous decompression fusion done in stages from L1-L5.  He has had a herniated nucleus pulposus several years ago at the T9-T10 level.  More recently he has developed significant back pain.  This is at the thoracolumbar junction.  Recent MRI demonstrates the presence of a herniated nucleus pulposus at the T12-L1 level with a fragment of disc that is migrated behind the vertebral body of T12 elevating compressing the spinal cord.  Because of the proximity to his fusion at the thoracolumbar junction and because of other spondylosis at T11-T12 he has been advised regarding decompression and arthrodesis to extend the fusion from L1 L5 up to T10.  He is now being admitted for this process.  Because of a history of osteoporosis augmentation with methacrylate will be performed at the time of fixation.  Past Medical History:  Diagnosis Date  . Arthritis   . 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 GERD (gastroesophageal reflux disease) Hx in past.  . History of kidney stones 12/14   Lithotrispy  . Hypertension     Past Surgical History:  Procedure Laterality Date  . ANTERIOR CERVICAL DECOMPRESSION/DISCECTOMY FUSION 4 LEVELS N/A 06/30/2012   Procedure: ANTERIOR CERVICAL DECOMPRESSION/DISCECTOMY FUSION 4 LEVELS;  Surgeon: Kristeen Miss, MD;  Location: [place] [facility] ORS;  Service: Neurosurgery;  Laterality: N/A;  C3-4 C4-5 C5-6 C6-7 Anterior cervical decompression/diskectomy/fusion  . BACK SURGERY  08/2010   lumbar fusion  . CATARACT EXTRACTION, BILATERAL    . CHOLECYSTECTOMY  1990's  . NECK SURGERY     x2  . sinus surgeries     x2  . SKIN CANCER EXCISION     arm chest and head basal cell  . TRANSURETHRAL RESECTION OF PROSTATE  1999    Family History  Problem Relation Age of Onset  . Tongue cancer Brother   . Heart disease Brother   . Heart disease Mother   .  Heart disease Sister   . Colon cancer Neg Hx    Social History:  reports that he has never smoked. He has never used smokeless tobacco. He reports current alcohol use of about 7.0 standard drinks of alcohol per week. He reports that he does not use drugs.  Allergies:  Allergies  Allergen Reactions  . Aspirin Other (See Comments)    Jittery if taking large quantities    Medications Prior to Admission  Medication Sig Dispense Refill  . atorvastatin (LIPITOR) 40 MG tablet Take 40 mg by mouth daily.    Marland Kitchen dicyclomine (BENTYL) 10 MG capsule Take 10 mg by mouth in the morning and at bedtime.    Marland Kitchen doxazosin (CARDURA) 4 MG tablet Take 4 mg by mouth daily.     Marland Kitchen glucosamine-chondroitin 500-400 MG tablet Take 1 tablet by mouth in the morning and at bedtime.    Marland Kitchen guaiFENesin (MUCINEX) 600 MG 12 hr tablet Take 600 mg by mouth daily.    Marland Kitchen HYDROcodone-acetaminophen (NORCO/VICODIN) 5-325 MG per tablet Take 1-2 tablets by mouth 2 (two) times daily as needed for pain. 40 tablet 0  . lisinopril (PRINIVIL,ZESTRIL) 20 MG tablet Take 20 mg by mouth every evening.     . meloxicam (MOBIC) 7.5 MG tablet Take 7.5 mg by mouth in the morning and at bedtime.    . Omega-3 Fatty Acids (SALMON OIL PO) Take 2 capsules by  mouth daily.    Vladimir Faster Glycol-Propyl Glycol 0.4-0.3 % SOLN Apply 1 drop to eye 2 (two) times daily.    . Probiotic Product (PRO-BIOTIC BLEND PO) Take 1 capsule by mouth daily.    . RABEprazole (ACIPHEX) 20 MG tablet Take 20 mg by mouth daily.    . Simethicone (GAS-X PO) Take 1 capsule by mouth 3 (three) times daily as needed (bloating). Uses [employer] brand    . testosterone cypionate (DEPOTESTOTERONE CYPIONATE) 100 MG/ML injection Inject 100 mg into the muscle every 28 (twenty-eight) days. For IM use only    . traMADol (ULTRAM) 50 MG tablet Take 50 mg by mouth every 6 (six) hours as needed for severe pain.      No results found for this or any previous visit (from the past 48 hour(s)). No  results found.  Review of Systems  Constitutional: Positive for activity change.  HENT: Negative.   Eyes: Negative.   Respiratory: Negative.   Cardiovascular: Negative.   Gastrointestinal: Negative.   Endocrine: Negative.   Genitourinary: Negative.   Musculoskeletal: Positive for back pain and myalgias.  Skin: Negative.   Allergic/Immunologic: Negative.   Neurological: Positive for weakness and numbness.  Hematological: Negative.   Psychiatric/Behavioral: Negative.     There were no vitals taken for this visit. Physical Exam Constitutional:      Appearance: Normal appearance.  HENT:     Head: Normocephalic.     Right Ear: Tympanic membrane normal.     Left Ear: Tympanic membrane normal.     Nose: Nose normal.     Mouth/Throat:     Mouth: Mucous membranes are dry.  Eyes:     Extraocular Movements: Extraocular movements intact.     Pupils: Pupils are equal, round, and reactive to light.  Pulmonary:     Effort: Pulmonary effort is normal.     Breath sounds: Normal breath sounds.  Abdominal:     General: Abdomen is flat. Bowel sounds are normal.     Palpations: Abdomen is soft.  Musculoskeletal:     Cervical back: Normal range of motion and neck supple.     Comments: Positive straight leg raising in either lower extremity at 30 degrees Patrick's maneuver is negative  Skin:    General: Skin is warm and dry.     Capillary Refill: Capillary refill takes less than 2 seconds.  Neurological:     Mental Status: He is alert.     Comments: Motor strength is intact in the upper extremities lower extremity strength is mild weakness in the iliopsoas at 4 out of 5 absent reflexes in the patellae and the Achilles.  Sensation is intact in the lower extremities.  Cranial nerve examination is normal.  Station and gait are intact  Psychiatric:        Mood and Affect: Mood normal.        Behavior: Behavior normal.        Thought Content: Thought content normal.        Judgment: Judgment  normal.      Assessment/Plan Herniated nucleus pulposus T12-L1.  Myelopathy.  History of fusion L1-L5.  Plan: Decompression of herniated nucleus pulposus T12-L1.  Fixation from T10 to previous hardware L1-L5 with post dural lateral fusion T10-L2.  Earleen Newport, MD 06/26/2020, 7:56 AM

## 2020-06-26 NOTE — Anesthesia Procedure Notes (Signed)
Procedure Name: Intubation Date/Time: 06/26/2020 10:51 AM Performed by: Thelma Comp, CRNA Pre-anesthesia Checklist: Patient identified, Emergency Drugs available, Suction available and Patient being monitored Patient Re-evaluated:Patient Re-evaluated prior to induction Oxygen Delivery Method: Circle System Utilized Preoxygenation: Pre-oxygenation with 100% oxygen Induction Type: IV induction Ventilation: Mask ventilation without difficulty Laryngoscope Size: Glidescope and 4 Grade View: Grade I Tube type: Oral Tube size: 7.5 mm Number of attempts: 1 Airway Equipment and Method: Stylet Placement Confirmation: ETT inserted through vocal cords under direct vision,  positive ETCO2 and breath sounds checked- equal and bilateral Secured at: 23 cm Tube secured with: Tape Dental Injury: Teeth and Oropharynx as per pre-operative assessment

## 2020-06-26 NOTE — Anesthesia Procedure Notes (Signed)
Arterial Line Insertion Start/End3/17/2022 9:04 AM, 06/26/2020 9:10 AM Performed by: Kyung Rudd, CRNA, CRNA  Patient location: Pre-op. Lidocaine 1% used for infiltration Left, radial was placed Catheter size: 20 G Hand hygiene performed , maximum sterile barriers used  and Seldinger technique used Allen's test indicative of satisfactory collateral circulation Attempts: 1 Procedure performed without using ultrasound guided technique. Following insertion, Biopatch and dressing applied. Post procedure assessment: normal

## 2020-06-26 NOTE — Anesthesia Postprocedure Evaluation (Signed)
Anesthesia Post Note  Patient: Daniel Wagner  Procedure(s) Performed: Thoracic ten-eleven, Thoracic eleven-twelve, Thoracic twelve-Lumbar one Laminectomy/foraminotomy/posterior fixation with augmented pedicle screws from Thoracic ten to Thoracic twelve, Thoracic twelve to Lumbar one  Left Transforaminal Lumbar Interbody Fusion, Posterior Lateral Fusion Thoracic ten to Lumbar one with posterior arthrodesis, allograft, autograft and proteios and robot assist (N/A Back) APPLICATION OF ROBOTIC ASSISTANCE FOR SPINAL PROCEDURE (N/A )     Patient location during evaluation: PACU Anesthesia Type: General Level of consciousness: sedated and patient cooperative Pain management: pain level controlled Vital Signs Assessment: post-procedure vital signs reviewed and stable Respiratory status: spontaneous breathing Cardiovascular status: stable Anesthetic complications: no   No complications documented.  Last Vitals:  Vitals:   06/26/20 1800 06/26/20 1821  BP:  123/70  Pulse:  88  Resp:  14  Temp: 36.4 C 36.5 C  SpO2:  100%    Last Pain:  Vitals:   06/26/20 1821  TempSrc: Oral  PainSc:                  Nolon Nations

## 2020-06-26 NOTE — Op Note (Signed)
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  History of fusion L1-L5.  History of fracture of T12. Postoperative diagnosis: Same Procedure: Posterior decompression via transpedicular route for T12-L1 herniated nucleus pulposus on the left.  Transforaminal interbody fusion T12-L1 on the left with autograft and allograft and Proteos.  Posterior arthrodesis from T10-L2 with local autograft allograft and Proteos.  Monitoring of evoked potentials both motor and sensory.  Robotic assistance and placement of pedicle screws T10-T11-T12.  Methacrylate augmentation T10-T11-T12. Surgeon: Kristeen Miss First Assistant: Elwin Sleight, DO Anesthesia: General endotracheal Indications: Daniel Wagner is a 80 year old individual who has had previous decompression fusion from L1 S1.  He has had increased pain in the back left lower extremity and left flank and was found to have a large extruded fragment of disc at T12-L1 causing cord compression.  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  Is admitted for this procedure.  Procedure: The patient was brought to the operating room supine on the stretcher.  After the appropriate placement of arterial monitoring lines Foley catheter he was carefully turned prone onto the Kingston table and the robotic arm was applied to the Hiram table.  Electromyographic leads were connected to multiple muscles in the lower musculature for somatosensory evoked potential monitoring of both motor and sensory function.  Then the back was prepped with alcohol DuraPrep and draped in a sterile fashion.  Previous incision was opened over the superior portion of the lumbar spine this was carried down to the lumbodorsal fascia opened on either side of midline to expose  the for spinous processes and the previously placed hardware at L1-L2.  There was adjacent hardware from L2 to the L5 region that was previously placed separately.  The hardware was exposed and then a subperiosteal dissection was carried cephalad to expose all the way up to T10.  The spinous process at L2 was then exposed and narrowed slightly to allow placement of a clamp to apply the robotic arm.  The robotic arm was applied to the clamp and then in the AP and oblique projections registration radiographs were obtained to correlate with the CT that was used for planning preoperatively.  Once the registration was performed pedicle entry sites were chosen at T10-T11-T12.  T10 pedicle entry sites were done percutaneously as the incision did not extend superiorly enough.  T11 and T12 were placed under direct visualization and K wires were placed into each of the pedicles using the robotic arm for guidance.  Then 5.5 x 45 mm screws were placed in T10 5.5 x 50 mm screws were placed in T11 and T12.  Once the screws were in place positioning was checked radiographically.  Decompression was then undertaken on the left side at the T12-L1 interspace removing the lateral aspect of the lamina and the facet joint roots completeness down to the level of the pedicle.  Transforaminal approach was chosen and just superior to the takeoff of the nerve root there was noted to be substantial quantities of freely herniated disc material in the epidural space.  This was decompressed from behind the body of T12.  This allowed for good decompression of the thecal sac.  Then the disc space was entered retracting the nerve root superiorly once the disc space was entered further degenerated disc material was encountered from the  teeth 12 and L1 interspace.  Curettage was performed to remove all the cartilage from the bony endplates.  Then a 4 x 8 x 23 mm spacer with 4 degrees of lordosis was placed into the interspace after packing the  interspace with a total of 5 cc of bone graft which was a combination of autograft allograft in the form of morselized bone chips and Proteos bone morphogenic protein product.  Once the cage was placed and the bone was packed attention was then turned to augmenting the pedicle screws with methacrylate.  1 cc of methacrylate was used in each of the fenestrated cannulated pedicle screws and radiographic exposure was performed during the placement of the cement to make sure there is no extravasation.  None was noted.  Once the augmentation procedure was completed, Bendini was then used to help place rods and an neutral straight rod was placed on the left side and a slightly contoured rod was placed on the right side.  The construct was tightened in a neutral construct.  The posterior elements were then decorticated from T10 down to L2 and the remainder of the bone graft was packed into the lateral gutters along with a piece of magna fuse.  This measured 5 cm in length and was used to cover the laminectomy defect on the left side.  Once this was placed hemostasis in the soft tissues was obtained and the thoracodorsal and lumbodorsal fascia was closed with #1 Vicryl in interrupted fashion 2-0 Vicryl was used in the subcutaneous tissues and 3-0 Vicryl subcuticularly blood loss estimated 800 cc in 300 cc of Cell Saver blood was returned to the patient.  Then had a dry sterile dressing applied to his back he was turned supine and returned to the recovery room in stable condition EMG and somatosensory monitoring during the case did not reveal any abnormalities in the loss of neural function.

## 2020-06-27 LAB — CBC
HCT: 30.8 % — ABNORMAL LOW (ref 39.0–52.0)
Hemoglobin: 10.8 g/dL — ABNORMAL LOW (ref 13.0–17.0)
MCH: 34.5 pg — ABNORMAL HIGH (ref 26.0–34.0)
MCHC: 35.1 g/dL (ref 30.0–36.0)
MCV: 98.4 fL (ref 80.0–100.0)
Platelets: 151 10*3/uL (ref 150–400)
RBC: 3.13 MIL/uL — ABNORMAL LOW (ref 4.22–5.81)
RDW: 13.3 % (ref 11.5–15.5)
WBC: 12.1 10*3/uL — ABNORMAL HIGH (ref 4.0–10.5)
nRBC: 0 % (ref 0.0–0.2)

## 2020-06-27 LAB — POCT I-STAT 7, (LYTES, BLD GAS, ICA,H+H)
Acid-base deficit: 2 mmol/L (ref 0.0–2.0)
Bicarbonate: 23.7 mmol/L (ref 20.0–28.0)
Calcium, Ion: 1.18 mmol/L (ref 1.15–1.40)
HCT: 34 % — ABNORMAL LOW (ref 39.0–52.0)
Hemoglobin: 11.6 g/dL — ABNORMAL LOW (ref 13.0–17.0)
O2 Saturation: 100 %
Patient temperature: 35.2
Potassium: 3.9 mmol/L (ref 3.5–5.1)
Sodium: 137 mmol/L (ref 135–145)
TCO2: 25 mmol/L (ref 22–32)
pCO2 arterial: 42.5 mmHg (ref 32.0–48.0)
pH, Arterial: 7.346 — ABNORMAL LOW (ref 7.350–7.450)
pO2, Arterial: 194 mmHg — ABNORMAL HIGH (ref 83.0–108.0)

## 2020-06-27 LAB — BASIC METABOLIC PANEL
Anion gap: 8 (ref 5–15)
BUN: 21 mg/dL (ref 8–23)
CO2: 22 mmol/L (ref 22–32)
Calcium: 8.3 mg/dL — ABNORMAL LOW (ref 8.9–10.3)
Chloride: 102 mmol/L (ref 98–111)
Creatinine, Ser: 1.21 mg/dL (ref 0.61–1.24)
GFR, Estimated: >60 mL/min (ref >60)
Glucose, Bld: 141 mg/dL — ABNORMAL HIGH (ref 70–99)
Potassium: 4.2 mmol/L (ref 3.5–5.1)
Sodium: 132 mmol/L — ABNORMAL LOW (ref 135–145)

## 2020-06-27 MED ORDER — METHOCARBAMOL 500 MG PO TABS: 500.0000 mg | ORAL_TABLET | Freq: Four times a day (QID) | ORAL | 3 refills | Status: DC | PRN

## 2020-06-27 MED ORDER — HYDROCODONE-ACETAMINOPHEN 5-325 MG PO TABS: 1.0000 | ORAL_TABLET | ORAL | 0 refills | Status: DC | PRN

## 2020-06-27 MED FILL — Thrombin For Soln Kit 5000 Unit: CUTANEOUS | Qty: 1 | Status: AC

## 2020-06-27 NOTE — Progress Notes (Signed)
Patient ID: Daniel Wagner, male   DOB: May 23, 1940, 80 y.o.   MRN: [id number] Vital signs are stable Incisions are clean and dry Patient is ambulatory with good strength in his legs Plan for discharge in the morning

## 2020-06-27 NOTE — TOC Initial Note (Signed)
Transition of Care [facility]) - Initial/Assessment Note    Patient Details  Name: Daniel Wagner MRN: [id number] Date of Birth: 06-30-1940  Transition of Care [facility]) CM/SW Contact:    Ella Bodo, RN Phone Number: 06/27/2020, 2:39 PM  Clinical Narrative:  The pt is a 80 yo male presenting 3/17 for T10-11, T11-12, and T12-L1 decompression and posterior fusion due to significant back pain and herniated nucleus pulposus with cord compression.  Prior to admission, patient independent and living at home with spouse.  Physical therapy recommending outpatient therapy and patient agreeable to services.  Patient lives in the [place] area, and would prefer outpatient rehab at [facility], if possible. Will send referral to [facility] outpatient rehab for follow-up.  Wife able to provide care at discharge; no DME needs per patient/therapists.                 Expected Discharge Plan: OP Rehab Barriers to Discharge: Continued Medical Work up   Patient Goals and CMS Choice Patient states their goals for this hospitalization and ongoing recovery are:: to go home      Expected Discharge Plan and Services Expected Discharge Plan: OP Rehab   Discharge Planning Services: CM Consult   Living arrangements for the past 2 months: Single Family Home                                      Prior Living Arrangements/Services Living arrangements for the past 2 months: Single Family Home Lives with:: Spouse Patient language and need for interpreter reviewed:: Yes Do you feel safe going back to the place where you live?: Yes      Need for Family Participation in Patient Care: Yes (Comment) Care giver support system in place?: Yes (comment)   Criminal Activity/Legal Involvement Pertinent to Current Situation/Hospitalization: No - Comment as needed  Activities of Daily Living      Permission Sought/Granted                  Emotional Assessment Appearance:: Appears stated  age Attitude/Demeanor/Rapport: Engaged Affect (typically observed): Accepting Orientation: : Oriented to Self,Oriented to Place,Oriented to  Time,Oriented to Situation      Admission diagnosis:  Status post laminectomy [Z98.890] Herniated nucleus pulposus with myelopathy, thoracic [M51.04] Patient Active Problem List   Diagnosis Date Noted  . Status post laminectomy 06/26/2020  . Herniated nucleus pulposus with myelopathy, thoracic 06/26/2020  . Gas pain 03/01/2016  . Bloating 03/01/2016  . Abdominal pain, epigastric 03/01/2016  . Gastroesophageal reflux disease 03/01/2016  . Lumbar stenosis with neurogenic claudication 03/19/2014  . Herniated nucleus pulposus with myelopathy, cervical 04/23/2011   PCP:  Cher Nakai, MD Pharmacy:   [facility], [place] - [address] [place] [address] Phone: [phone] Fax: [phone]     Social Determinants of Health (SDOH) Interventions    Readmission Risk Interventions No flowsheet data found.   Reinaldo Raddle, RN, BSN  Trauma/Neuro ICU Case Manager [phone]

## 2020-06-27 NOTE — Evaluation (Signed)
Physical Therapy Evaluation Patient Details Name: Daniel Wagner MRN: [id number] DOB: May 14, 1940 Today's Date: 06/27/2020   History of Present Illness  The pt is a 80 yo male presenting 3/17 for T10-11, T11-12, and T12-L1 decompression and posterior fusion due to significant back pain and herniated nucleus pulposus with cord compression. PMH includes: skin cancer, HTN, C3-7 fusion, and prior lumbar fusion.    Clinical Impression  Pt in bed upon arrival of PT, agreeable to evaluation at this time. Prior to admission the pt was independent with mobility, ADLs, and living with his wife in a home with 1 step to enter where he enjoys working in his yard. The pt now presents with minor limitations in functional mobility, endurance, and strength due to above dx and resulting pain, but is safe to return home with family when medically cleared. The pt was able to complete transfers without UE support or assist, and complete 250 ft hallway ambulation without need for UE support or assist. The pt was educated on all spinal precautions and application to common movements at home. Discussed possible OPPT for continued balance and postural strengthening when medically cleared.     Follow Up Recommendations Outpatient PT;Supervision for mobility/OOB (OPPT for balance and postural muscles when cleared by MD)    Equipment Recommendations  None recommended by PT    Recommendations for Other Services       Precautions / Restrictions Precautions Precautions: Back Precaution Booklet Issued: Yes (comment) Precaution Comments: pt able to recall 2/3 from prior surgeries, 3/3 by end of session. reviewed handout Required Braces or Orthoses: Spinal Brace Spinal Brace: Thoracolumbosacral orthotic;Applied in sitting position (TLSO ordered but not in room, MD cleared to mobilize with pt's LSO from prior surgery) Restrictions Weight Bearing Restrictions: No      Mobility  Bed Mobility Overal bed mobility: Needs  Assistance             General bed mobility comments: discussed log roll, pt OOB in recliner at start and end of session    Transfers Overall transfer level: Needs assistance Equipment used: None Transfers: Sit to/from Stand Sit to Stand: Supervision         General transfer comment: supervision for safety, pt with good control and use of UE  Ambulation/Gait Ambulation/Gait assistance: Min guard;Supervision Gait Distance (Feet): 250 Feet Assistive device: None Gait Pattern/deviations: Step-through pattern;Decreased stride length;Decreased weight shift to left Gait velocity: 0.9 m/s Gait velocity interpretation: >2.62 ft/sec, indicative of community ambulatory General Gait Details: cues for improved posture, upright trunk with shoulders back. pt with no LOB, states he is walking slightly slower than normal pace     Balance Overall balance assessment: Mild deficits observed, not formally tested                                           Pertinent Vitals/Pain Pain Assessment: Faces Faces Pain Scale: Hurts little more Pain Location: back Pain Descriptors / Indicators: Discomfort Pain Intervention(s): Monitored during session;Repositioned;Patient requesting pain meds-RN notified    Home Living Family/patient expects to be discharged to:: Private residence Living Arrangements: Spouse/significant other Available Help at Discharge: Family;Available 24 hours/day Type of Home: House Home Access: Stairs to enter Entrance Stairs-Rails: Right Entrance Stairs-Number of Steps: 1 Home Layout: One level Home Equipment: Walker - 2 wheels;Shower seat;Hand held shower head      Prior Function Level of Independence: Independent  Comments: independent without need for AD, no falls, enjoys working in the yard     [place] Hand: Right    Extremity/Trunk Assessment   Upper Extremity Assessment Upper Extremity Assessment: Overall  WFL for tasks assessed    Lower Extremity Assessment Lower Extremity Assessment: Overall WFL for tasks assessed    Cervical / Trunk Assessment Cervical / Trunk Assessment: Kyphotic;Other exceptions Cervical / Trunk Exceptions: s/p spine surgery  Communication   Communication: No difficulties  Cognition Arousal/Alertness: Awake/alert Behavior During Therapy: WFL for tasks assessed/performed Overall Cognitive Status: Within Functional Limits for tasks assessed                                 General Comments: pt pleasant and agreeable, verbalized understanding of all education and good safety awareness without cueing      General Comments General comments (skin integrity, edema, etc.): pt asking about when he can shower, placement of bed rail he has purchased for his bed at home        Assessment/Plan    PT Assessment Patent does not need any further PT services  PT Problem List Decreased activity tolerance;Decreased mobility;Decreased strength;Pain       PT Treatment Interventions      PT Goals (Current goals can be found in the Care Plan section)  Acute Rehab PT Goals Patient Stated Goal: return to working in the yard PT Goal Formulation: With patient Time For Goal Achievement: 07/12/20 Potential to Achieve Goals: Good     AM-PAC PT "6 Clicks" Mobility  Outcome Measure Help needed turning from your back to your side while in a flat bed without using bedrails?: A Little Help needed moving from lying on your back to sitting on the side of a flat bed without using bedrails?: A Little Help needed moving to and from a bed to a chair (including a wheelchair)?: None Help needed standing up from a chair using your arms (e.g., wheelchair or bedside chair)?: None Help needed to walk in hospital room?: A Little Help needed climbing 3-5 steps with a railing? : A Little 6 Click Score: 20    End of Session Equipment Utilized During Treatment: Gait belt;Back  brace Activity Tolerance: Patient tolerated treatment well Patient left: in chair;with call bell/phone within reach;with nursing/sitter in room Nurse Communication: Mobility status;Patient requests pain meds PT Visit Diagnosis: Other abnormalities of gait and mobility (R26.89);Pain Pain - part of body:  (back)    Time: 4801-6553 PT Time Calculation (min) (ACUTE ONLY): 27 min   Charges:   PT Evaluation $PT Eval Low Complexity: 1 Low PT Treatments $Gait Training: 8-22 mins        Karma Ganja, PT, DPT   Acute Rehabilitation Department Pager #: [phone]  Otho Bellows 06/27/2020, 8:59 AM

## 2020-06-27 NOTE — Progress Notes (Signed)
Orthopedic Tech Progress Note Patient Details:  Daniel Wagner Aug 29, 1940 [id number]  Patient ID: [name], male   DOB: November 10, 1940, 80 y.o.   MRN: [id number]   Daniel Wagner 06/27/2020, 4:38 PM Patient has brace

## 2020-06-27 NOTE — Plan of Care (Signed)
  Problem: Education: Goal: Knowledge of General Education information will improve Description: Including pain rating scale, medication(s)/side effects and non-pharmacologic comfort measures 06/27/2020 1105 by Katherina Right, RN Outcome: Progressing 06/27/2020 1104 by Katherina Right, RN Outcome: Progressing   Problem: Health Behavior/Discharge Planning: Goal: Ability to manage health-related needs will improve 06/27/2020 1105 by Katherina Right, RN Outcome: Progressing 06/27/2020 1104 by Katherina Right, RN Outcome: Progressing   Problem: Clinical Measurements: Goal: Ability to maintain clinical measurements within normal limits will improve 06/27/2020 1105 by Katherina Right, RN Outcome: Progressing 06/27/2020 1104 by Katherina Right, RN Outcome: Progressing Goal: Will remain free from infection 06/27/2020 1105 by Katherina Right, RN Outcome: Progressing 06/27/2020 1104 by Katherina Right, RN Outcome: Progressing Goal: Diagnostic test results will improve 06/27/2020 1105 by Katherina Right, RN Outcome: Progressing 06/27/2020 1104 by Katherina Right, RN Outcome: Progressing Goal: Respiratory complications will improve 06/27/2020 1105 by Katherina Right, RN Outcome: Progressing 06/27/2020 1104 by Katherina Right, RN Outcome: Progressing Goal: Cardiovascular complication will be avoided 06/27/2020 1105 by Katherina Right, RN Outcome: Progressing 06/27/2020 1104 by Katherina Right, RN Outcome: Progressing   Problem: Activity: Goal: Risk for activity intolerance will decrease 06/27/2020 1105 by Katherina Right, RN Outcome: Progressing 06/27/2020 1104 by Katherina Right, RN Outcome: Progressing   Problem: Nutrition: Goal: Adequate nutrition will be maintained 06/27/2020 1105 by Katherina Right, RN Outcome: Progressing 06/27/2020 1104 by Katherina Right, RN Outcome: Progressing   Problem: Coping: Goal: Level of anxiety will decrease 06/27/2020 1105 by Katherina Right, RN Outcome: Progressing 06/27/2020 1104 by  Katherina Right, RN Outcome: Progressing   Problem: Elimination: Goal: Will not experience complications related to bowel motility 06/27/2020 1105 by Katherina Right, RN Outcome: Progressing 06/27/2020 1104 by Katherina Right, RN Outcome: Progressing Goal: Will not experience complications related to urinary retention 06/27/2020 1105 by Katherina Right, RN Outcome: Progressing 06/27/2020 1104 by Katherina Right, RN Outcome: Progressing   Problem: Pain Managment: Goal: General experience of comfort will improve 06/27/2020 1105 by Katherina Right, RN Outcome: Progressing 06/27/2020 1104 by Katherina Right, RN Outcome: Progressing   Problem: Safety: Goal: Ability to remain free from injury will improve 06/27/2020 1105 by Katherina Right, RN Outcome: Progressing 06/27/2020 1104 by Katherina Right, RN Outcome: Progressing   Problem: Skin Integrity: Goal: Risk for impaired skin integrity will decrease 06/27/2020 1105 by Katherina Right, RN Outcome: Progressing 06/27/2020 1104 by Katherina Right, RN Outcome: Progressing

## 2020-06-27 NOTE — Discharge Summary (Addendum)
Physician Discharge Summary  Patient ID: Daniel Wagner MRN: [id number] DOB/AGE: 1940/10/20 80 y.o.  Admit date: 06/26/2020 Discharge date: 06/28/2020  Admission Diagnoses: Herniated nucleus pulposus T12-L1 with myelopathy status post fusion L1-L5  Discharge Diagnoses: Herniated nucleus pulposus T12-L1 with myelopathy.  Status post fusion L1-L5. Active Problems:   Status post laminectomy   Herniated nucleus pulposus with myelopathy, thoracic   Discharged Condition: good  Hospital Course: Patient was admitted for surgery which he tolerated well.  He is discharged home  Consults: None  Significant Diagnostic Studies: None  Treatments: surgery: See op note  Discharge Exam: Blood pressure (!) 111/47, pulse 91, temperature 97.9 F (36.6 C), resp. rate 18, height 5\' 10"  (1.778 m), weight 85.3 kg, SpO2 99 %. Incision is clean and dry Station and gait are intact  Disposition: Discharge disposition: 01-Home or Self Care       Discharge Instructions    Call MD for:  redness, tenderness, or signs of infection (pain, swelling, redness, odor or green/yellow discharge around incision site)   Complete by: As directed    Call MD for:  severe uncontrolled pain   Complete by: As directed    Call MD for:  temperature >100.4   Complete by: As directed    Diet - low sodium heart healthy   Complete by: As directed    Discharge wound care:   Complete by: As directed    Okay to shower. Do not apply salves or appointments to incision. No heavy lifting with the upper extremities greater than 10 pounds. May resume driving when not requiring pain medication and patient feels comfortable with doing so.   Incentive spirometry RT   Complete by: As directed    Increase activity slowly   Complete by: As directed      Allergies as of 06/27/2020      Reactions   Aspirin Other (See Comments)   Jittery if taking large quantities      Medication List    TAKE these medications   atorvastatin 40 MG  tablet Commonly known as: LIPITOR Take 40 mg by mouth daily.   dicyclomine 10 MG capsule Commonly known as: BENTYL Take 10 mg by mouth in the morning and at bedtime.   doxazosin 4 MG tablet Commonly known as: CARDURA Take 4 mg by mouth daily.   GAS-X PO Take 1 capsule by mouth 3 (three) times daily as needed (bloating). Uses [employer] brand   glucosamine-chondroitin 500-400 MG tablet Take 1 tablet by mouth in the morning and at bedtime.   guaiFENesin 600 MG 12 hr tablet Commonly known as: MUCINEX Take 600 mg by mouth daily.   HYDROcodone-acetaminophen 5-325 MG tablet Commonly known as: NORCO/VICODIN Take 1-2 tablets by mouth every 4 (four) hours as needed for moderate pain or severe pain. What changed:   when to take this  reasons to take this   lisinopril 20 MG tablet Commonly known as: ZESTRIL Take 20 mg by mouth every evening.   meloxicam 7.5 MG tablet Commonly known as: MOBIC Take 7.5 mg by mouth in the morning and at bedtime.   methocarbamol 500 MG tablet Commonly known as: ROBAXIN Take 1 tablet (500 mg total) by mouth every 6 (six) hours as needed for muscle spasms.   Polyethyl Glycol-Propyl Glycol 0.4-0.3 % Soln Apply 1 drop to eye 2 (two) times daily.   PRO-BIOTIC BLEND PO Take 1 capsule by mouth daily.   RABEprazole 20 MG tablet Commonly known as: ACIPHEX Take 20 mg by mouth  daily.   SALMON OIL PO Take 2 capsules by mouth daily.   testosterone cypionate 100 MG/ML injection Commonly known as: DEPOTESTOTERONE CYPIONATE Inject 100 mg into the muscle every 28 (twenty-eight) days. For IM use only   traMADol 50 MG tablet Commonly known as: ULTRAM Take 50 mg by mouth every 6 (six) hours as needed for severe pain.            Discharge Care Instructions  (From admission, onward)         Start     Ordered   06/27/20 0000  Discharge wound care:       Comments: Okay to shower. Do not apply salves or appointments to incision. No heavy lifting  with the upper extremities greater than 10 pounds. May resume driving when not requiring pain medication and patient feels comfortable with doing so.   06/27/20 [facility] Follow up.   Why: Outpatient physical therapy; rehab center will call you for an appointment, or you may call to schedule. Contact information: [address] [place], [place] [address]  Phone: [phone] Fax: [phone]              Signed: Earleen Newport 06/27/2020, 7:06 PM

## 2020-06-27 NOTE — Evaluation (Signed)
Occupational Therapy Evaluation Patient Details Name: Daniel Wagner MRN: [id number] DOB: 01-31-1941 Today's Date: 06/27/2020    History of Present Illness The pt is a 80 yo male presenting 3/17 for T10-11, T11-12, and T12-L1 decompression and posterior fusion due to significant back pain and herniated nucleus pulposus with cord compression. PMH includes: skin cancer, HTN, C3-7 fusion, and prior lumbar fusion.   Clinical Impression   Pt admitted with the above diagnoses. Pt independent with ADLs at baseline, active. Pt currently needs supervision to min guard assist with functional transfers (toilet, shower), mobility, and LB ADLs. Reviewed back education and compensatory strategies as related to ADLs. Pt with good recall from previous back surgery. Spouse present throughout session. No further acute OT needs identified. OT to sign off.      Follow Up Recommendations  No OT follow up;Supervision - Intermittent    Equipment Recommendations  None recommended by OT    Recommendations for Other Services       Precautions / Restrictions Precautions Precautions: Back Precaution Booklet Issued: Yes (comment) Precaution Comments: reviewed BLT precautions Required Braces or Orthoses: Spinal Brace Spinal Brace: Thoracolumbosacral orthotic;Applied in sitting position (TLSO ordered but not in room, MD cleared to mobilize with pt's LSO from prior surgery) Restrictions Weight Bearing Restrictions: No      Mobility Bed Mobility Overal bed mobility: Needs Assistance Bed Mobility: Sit to Sidelying         Sit to sidelying: Min guard General bed mobility comments: min guard to BLE as he struggled just a bit bringing up onto bed. Ultimately no physical assist need. Pain a limiting factor.    Transfers Overall transfer level: Needs assistance Equipment used: None Transfers: Sit to/from Stand Sit to Stand: Supervision         General transfer comment: supervision for safety. to/from EOB  and recliner. Extra time and effort.    Balance Overall balance assessment: Mild deficits observed, not formally tested                                         ADL either performed or assessed with clinical judgement   ADL Overall ADL's : Needs assistance/impaired Eating/Feeding: Set up;Sitting   Grooming: Set up;Sitting;Supervision/safety;Standing   Upper Body Bathing: Set up;Sitting   Lower Body Bathing: Min guard;Sit to/from stand   Upper Body Dressing : Set up;Sitting   Lower Body Dressing: Min guard;Sit to/from stand   Toilet Transfer: Supervision/safety;Ambulation   Toileting- Clothing Manipulation and Hygiene: Set up;Min guard;Sitting/lateral lean;Sit to/from stand   Tub/ Shower Transfer: Walk-in shower;Min guard;Ambulation;Shower seat   Functional mobility during ADLs: Supervision/safety General ADL Comments: Reviewed compensatory strategies for ADLs with back precautions. Pt with good recall from previous back surgery.     Vision         Perception     Praxis      Pertinent Vitals/Pain Pain Assessment: Faces Faces Pain Scale: Hurts even more Pain Location: back Pain Descriptors / Indicators: Grimacing;Aching;Guarding;Sore Pain Intervention(s): Limited activity within patient's tolerance;Monitored during session;Repositioned;Other (comment) (RN gave pain med during session)     Hand Dominance Right   Extremity/Trunk Assessment Upper Extremity Assessment Upper Extremity Assessment: Overall WFL for tasks assessed   Lower Extremity Assessment Lower Extremity Assessment: Overall WFL for tasks assessed;Defer to PT evaluation   Cervical / Trunk Assessment Cervical / Trunk Assessment: Kyphotic;Other exceptions Cervical / Trunk Exceptions: s/p spine surgery  Communication Communication Communication: No difficulties   Cognition Arousal/Alertness: Awake/alert Behavior During Therapy: WFL for tasks assessed/performed Overall Cognitive  Status: Within Functional Limits for tasks assessed                                 General Comments: pt pleasant and agreeable, verbalized understanding of all education and good safety awareness without cueing   General Comments  Spouse present and invovled throughout session    Exercises     Shoulder Instructions      Home Living Family/patient expects to be discharged to:: Private residence Living Arrangements: Spouse/significant other Available Help at Discharge: Family;Available 24 hours/day Type of Home: House Home Access: Stairs to enter [employer] of Steps: 1 Entrance Stairs-Rails: Right Home Layout: One level     Bathroom Shower/Tub: Occupational psychologist: Handicapped height     Home Equipment: Environmental consultant - 2 wheels;Shower seat;Hand held shower head          Prior Functioning/Environment Level of Independence: Independent        Comments: independent without need for AD, no falls, enjoys working in the yard        OT Problem List:        OT Treatment/Interventions:      OT Goals(Current goals can be found in the care plan section) Acute Rehab OT Goals Patient Stated Goal: return to working in the yard  OT Frequency:     Barriers to D/C:            Co-evaluation              AM-PAC OT "6 Clicks" Daily Activity     Outcome Measure Help from another person eating meals?: None Help from another person taking care of personal grooming?: None Help from another person toileting, which includes using toliet, bedpan, or urinal?: None Help from another person bathing (including washing, rinsing, drying)?: A Little Help from another person to put on and taking off regular upper body clothing?: None Help from another person to put on and taking off regular lower body clothing?: A Little 6 Click Score: 22   End of Session Equipment Utilized During Treatment: Back brace Nurse Communication: Other (comment) (RN  present for parts of OT session)  Activity Tolerance: Patient tolerated treatment well;Patient limited by pain Patient left: in bed;with call bell/phone within reach;with family/visitor present  OT Visit Diagnosis: Pain;Unsteadiness on feet (R26.81)                Time: 7622-6333 OT Time Calculation (min): 11 min Charges:  OT General Charges $OT Visit: 1 Visit OT Evaluation $OT Eval Low Complexity: [place], OT Acute Rehabilitation Services Pager: [phone] Office: [phone]   Hortencia Pilar 06/27/2020, 10:34 AM

## 2020-07-01 ENCOUNTER — Encounter (HOSPITAL_COMMUNITY): Payer: Self-pay | Admitting: Neurological Surgery

## 2020-07-01 MED FILL — Sodium Chloride Irrigation Soln 0.9%: Qty: 3000 | Status: AC

## 2020-07-01 MED FILL — Sodium Chloride IV Soln 0.9%: INTRAVENOUS | Qty: 1000 | Status: AC

## 2020-07-01 MED FILL — Heparin Sodium (Porcine) Inj 1000 Unit/ML: INTRAMUSCULAR | Qty: 30 | Status: AC

## 2020-07-10 ENCOUNTER — Encounter: Payer: Self-pay | Admitting: Gastroenterology

## 2020-09-11 ENCOUNTER — Ambulatory Visit (AMBULATORY_SURGERY_CENTER): Payer: Medicare Other

## 2020-09-11 VITALS — Ht 70.0 in | Wt 180.0 lb

## 2020-09-11 DIAGNOSIS — Z86010 Personal history of colonic polyps: Secondary | ICD-10-CM

## 2020-09-11 NOTE — Progress Notes (Signed)

## 2020-09-23 ENCOUNTER — Encounter: Payer: Self-pay | Admitting: Gastroenterology

## 2020-09-25 ENCOUNTER — Other Ambulatory Visit: Payer: Self-pay

## 2020-09-25 ENCOUNTER — Encounter: Payer: Self-pay | Admitting: Gastroenterology

## 2020-09-25 ENCOUNTER — Ambulatory Visit (AMBULATORY_SURGERY_CENTER): Payer: Medicare Other | Admitting: Gastroenterology

## 2020-09-25 VITALS — BP 132/70 | HR 71 | Temp 98.0°F | Resp 19 | Ht 70.0 in | Wt 180.0 lb

## 2020-09-25 DIAGNOSIS — D122 Benign neoplasm of ascending colon: Secondary | ICD-10-CM

## 2020-09-25 DIAGNOSIS — D124 Benign neoplasm of descending colon: Secondary | ICD-10-CM | POA: Diagnosis not present

## 2020-09-25 DIAGNOSIS — Z86010 Personal history of colonic polyps: Secondary | ICD-10-CM | POA: Diagnosis present

## 2020-09-25 DIAGNOSIS — D120 Benign neoplasm of cecum: Secondary | ICD-10-CM | POA: Diagnosis not present

## 2020-09-25 DIAGNOSIS — D123 Benign neoplasm of transverse colon: Secondary | ICD-10-CM

## 2020-09-25 DIAGNOSIS — D128 Benign neoplasm of rectum: Secondary | ICD-10-CM

## 2020-09-25 MED ORDER — SODIUM CHLORIDE 0.9 % IV SOLN: 500.0000 mL | Freq: Once | INTRAVENOUS | Status: DC

## 2020-09-25 NOTE — Patient Instructions (Signed)
Handouts provided on polyps and hemorrhoids.   YOU HAD AN ENDOSCOPIC PROCEDURE TODAY AT THE [facility]:   Refer to the procedure report that was given to you for any specific questions about what was found during the examination.  If the procedure report does not answer your questions, please call your gastroenterologist to clarify.  If you requested that your care partner not be given the details of your procedure findings, then the procedure report has been included in a sealed envelope for you to review at your convenience later.  YOU SHOULD EXPECT: Some feelings of bloating in the abdomen. Passage of more gas than usual.  Walking can help get rid of the air that was put into your GI tract during the procedure and reduce the bloating. If you had a lower endoscopy (such as a colonoscopy or flexible sigmoidoscopy) you may notice spotting of blood in your stool or on the toilet paper. If you underwent a bowel prep for your procedure, you may not have a normal bowel movement for a few days.  Please Note:  You might notice some irritation and congestion in your nose or some drainage.  This is from the oxygen used during your procedure.  There is no need for concern and it should clear up in a day or so.  SYMPTOMS TO REPORT IMMEDIATELY:  Following lower endoscopy (colonoscopy or flexible sigmoidoscopy):  Excessive amounts of blood in the stool  Significant tenderness or worsening of abdominal pains  Swelling of the abdomen that is new, acute  Fever of 100F or higher  For urgent or emergent issues, a gastroenterologist can be reached at any hour by calling [phone]. Do not use MyChart messaging for urgent concerns.    DIET:  We do recommend a small meal at first, but then you may proceed to your regular diet.  Drink plenty of fluids but you should avoid alcoholic beverages for 24 hours.  ACTIVITY:  You should plan to take it easy for the rest of today and you should NOT DRIVE  or use heavy machinery until tomorrow (because of the sedation medicines used during the test).    FOLLOW UP: Our staff will call the number listed on your records 48-72 hours following your procedure to check on you and address any questions or concerns that you may have regarding the information given to you following your procedure. If we do not reach you, we will leave a message.  We will attempt to reach you two times.  During this call, we will ask if you have developed any symptoms of COVID 19. If you develop any symptoms (ie: fever, flu-like symptoms, shortness of breath, cough etc.) before then, please call [phone].  If you test positive for Covid 19 in the 2 weeks post procedure, please call and report this information to us.    If any biopsies were taken you will be contacted by phone or by letter within the next 1-3 weeks.  Please call us at [phone] if you have not heard about the biopsies in 3 weeks.    SIGNATURES/CONFIDENTIALITY: You and/or your care partner have signed paperwork which will be entered into your electronic medical record.  These signatures attest to the fact that that the information above on your After Visit Summary has been reviewed and is understood.  Full responsibility of the confidentiality of this discharge information lies with you and/or your care-partner.  

## 2020-09-25 NOTE — Progress Notes (Signed)
Report to PACU, RN, vss, BBS= Clear.  

## 2020-09-25 NOTE — Progress Notes (Signed)
Pt's states no medical or surgical changes since previsit or office visit. 

## 2020-09-25 NOTE — Progress Notes (Signed)
Called to room to assist during endoscopic procedure.  Patient ID and intended procedure confirmed with present staff. Received instructions for my participation in the procedure from the performing physician.  

## 2020-09-25 NOTE — Op Note (Signed)
[place] Patient Name: Daniel Wagner Procedure Date: 09/25/2020 11:12 AM MRN: [id number] Endoscopist: Ladene Artist , MD Age: 80 Referring MD:  Date of Birth: Mar 07, 1941 Gender: Male Account #: [id number] Procedure:                Colonoscopy Indications:              Surveillance: Personal history of adenomatous                            polyps on last colonoscopy > 5 years ago Medicines:                Monitored Anesthesia Care Procedure:                Pre-Anesthesia Assessment:                           - Prior to the procedure, a History and Physical                            was performed, and patient medications and                            allergies were reviewed. The patient's tolerance of                            previous anesthesia was also reviewed. The risks                            and benefits of the procedure and the sedation                            options and risks were discussed with the patient.                            All questions were answered, and informed consent                            was obtained. Prior Anticoagulants: The patient has                            taken no previous anticoagulant or antiplatelet                            agents. ASA Grade Assessment: III - A patient with                            severe systemic disease. After reviewing the risks                            and benefits, the patient was deemed in                            satisfactory condition to undergo the procedure.  After obtaining informed consent, the colonoscope                            was passed under direct vision. Throughout the                            procedure, the patient's blood pressure, pulse, and                            oxygen saturations were monitored continuously. The                            Olympus CF-HQ190L [id number]) Colonoscope was                            introduced through the anus  and advanced to the the                            cecum, identified by appendiceal orifice and                            ileocecal valve. The ileocecal valve, appendiceal                            orifice, and rectum were photographed. The quality                            of the bowel preparation was adequate after                            extensive lavage and suction. The colonoscopy was                            performed without difficulty. The patient tolerated                            the procedure well. Scope In: 11:16:25 AM Scope Out: 81:27:51 AM Scope Withdrawal Time: 0 hours 17 minutes 28 seconds  Total Procedure Duration: 0 hours 19 minutes 52 seconds  Findings:                 The perianal and digital rectal examinations were                            normal.                           Five sessile polyps were found in the rectum (1),                            descending colon (1), transverse colon (2) and                            ascending colon (1). The polyps were 5 to 9 mm in  size. These polyps were removed with a cold snare.                            Resection and retrieval were complete.                           Internal hemorrhoids were found during                            retroflexion. The hemorrhoids were small and Grade                            I (internal hemorrhoids that do not prolapse).                           The exam was otherwise without abnormality on                            direct and retroflexion views. Complications:            No immediate complications. Estimated blood loss:                            None. Estimated Blood Loss:     Estimated blood loss: none. Impression:               - Five 5 to 9 mm polyps in the rectum, in the                            descending colon, in the transverse colon and in                            the ascending colon, removed with a cold snare.                             Resected and retrieved.                           - Internal hemorrhoids.                           - The examination was otherwise normal on direct                            and retroflexion views. Recommendation:           - Repeat colonoscopy after studies are complete for                            surveillance vs. no repeat due to age based on                            pathology results with a more extensive bowel prep.                           - Patient  has a contact number available for                            emergencies. The signs and symptoms of potential                            delayed complications were discussed with the                            patient. Return to normal activities tomorrow.                            Written discharge instructions were provided to the                            patient.                           - Resume previous diet.                           - Continue present medications.                           - Await pathology results. Ladene Artist, MD 09/25/2020 11:40:31 AM This report has been signed electronically.

## 2020-09-29 ENCOUNTER — Telehealth: Payer: Self-pay | Admitting: *Deleted

## 2020-09-29 NOTE — Telephone Encounter (Signed)
  Follow up Call-  Call back number 09/25/2020  Post procedure Call Back phone  # [phone]  Permission to leave phone message Yes  Some recent data might be hidden     Patient questions:  Do you have a fever, pain , or abdominal swelling? No. Pain Score  0 *  Have you tolerated food without any problems? Yes.    Have you been able to return to your normal activities? Yes.    Do you have any questions about your discharge instructions: Diet   No. Medications  No. Follow up visit  No.  Do you have questions or concerns about your Care? No.  Actions: * If pain score is 4 or above: No action needed, pain <4.  Have you developed a fever since your procedure? no  2.   Have you had an respiratory symptoms (SOB or cough) since your procedure? no  3.   Have you tested positive for COVID 19 since your procedure no  4.   Have you had any family members/close contacts diagnosed with the COVID 19 since your procedure?  no   If yes to any of these questions please route to Joylene John, RN and Joella Prince, RN

## 2020-09-30 ENCOUNTER — Encounter: Payer: Self-pay | Admitting: Gastroenterology

## 2021-01-28 DIAGNOSIS — M19079 Primary osteoarthritis, unspecified ankle and foot: Secondary | ICD-10-CM | POA: Insufficient documentation

## 2021-06-23 ENCOUNTER — Other Ambulatory Visit (HOSPITAL_COMMUNITY): Payer: Self-pay | Admitting: Neurological Surgery

## 2021-06-23 DIAGNOSIS — M48062 Spinal stenosis, lumbar region with neurogenic claudication: Secondary | ICD-10-CM

## 2021-06-23 DIAGNOSIS — M4714 Other spondylosis with myelopathy, thoracic region: Secondary | ICD-10-CM

## 2021-07-01 ENCOUNTER — Other Ambulatory Visit: Payer: Self-pay

## 2021-07-01 ENCOUNTER — Ambulatory Visit (HOSPITAL_COMMUNITY)
Admission: RE | Admit: 2021-07-01 | Discharge: 2021-07-01 | Disposition: A | Discharge status: Home / Self Care | Payer: Medicare Other | Source: Ambulatory Visit | Attending: Neurological Surgery | Admitting: Neurological Surgery

## 2021-07-01 DIAGNOSIS — M4724 Other spondylosis with radiculopathy, thoracic region: Secondary | ICD-10-CM | POA: Insufficient documentation

## 2021-07-01 DIAGNOSIS — M4714 Other spondylosis with myelopathy, thoracic region: Secondary | ICD-10-CM

## 2021-07-01 DIAGNOSIS — M5116 Intervertebral disc disorders with radiculopathy, lumbar region: Secondary | ICD-10-CM | POA: Diagnosis not present

## 2021-07-01 DIAGNOSIS — M48061 Spinal stenosis, lumbar region without neurogenic claudication: Secondary | ICD-10-CM | POA: Diagnosis not present

## 2021-07-01 DIAGNOSIS — M5117 Intervertebral disc disorders with radiculopathy, lumbosacral region: Secondary | ICD-10-CM | POA: Insufficient documentation

## 2021-07-01 DIAGNOSIS — M48062 Spinal stenosis, lumbar region with neurogenic claudication: Secondary | ICD-10-CM

## 2021-07-01 MED ORDER — HYDROCODONE-ACETAMINOPHEN 5-325 MG PO TABS
1.0000 | ORAL_TABLET | ORAL | Status: DC | PRN
  Administered 2021-07-01: 2 via ORAL
  Filled 2021-07-01: qty 2

## 2021-07-01 MED ORDER — LIDOCAINE HCL (PF) 1 % IJ SOLN
5.0000 mL | Freq: Once | INTRAMUSCULAR | Status: AC
  Administered 2021-07-01: 5 mL via INTRADERMAL

## 2021-07-01 MED ORDER — ONDANSETRON HCL 4 MG/2ML IJ SOLN: 4.0000 mg | Freq: Four times a day (QID) | INTRAMUSCULAR | Status: DC | PRN

## 2021-07-01 MED ORDER — IOHEXOL 300 MG/ML  SOLN
10.0000 mL | Freq: Once | INTRAMUSCULAR | Status: AC | PRN
  Administered 2021-07-01: 9 mL via INTRATHECAL

## 2021-07-01 NOTE — Discharge Instructions (Signed)
Myelogram and Lumbar Puncture Discharge Instructions ? ?Go home and rest quietly for the next 24 hours.  It is important to lie flat for the next 24 hours.  Get up only to go to the restroom.  You may lie in the bed or on a couch on your back, your stomach, your left side or your right side.  You may have one pillow under your head.  You may have pillows between your knees while you are on your side or under your knees while you are on your back. ? ?DO NOT drive today.  Recline the seat as far back as it will go, while still wearing your seat belt, on the way home. ? ?You may get up to go to the bathroom as needed.  You may sit up for 10 minutes to eat.  You may resume your normal diet and medications unless otherwise indicated. ? ?The incidence of headache, nausea, or vomiting is about 5% (one in 20 patients).  If you develop a headache, lie flat and drink plenty of fluids until the headache goes away.  Caffeinated beverages may be helpful.  If you develop severe nausea and vomiting or a headache that does not go away with flat bed rest and call Dr Ellene Route ? ?You may resume normal activities after your 24 hours of bed rest is over; however, do not exert yourself strongly or do any heavy lifting tomorrow. ? ?Call your physician for a follow-up appointment.  The results of your myelogram will be sent directly to your physician by the following day. ? ?If you have any questions or if complications develop after you arrive home, please call Dr Ellene Route ? ?Discharge instructions have been explained to the patient.  The patient, or the person responsible for the patient, fully understands these instructions. ? ? ?

## 2021-07-01 NOTE — Procedures (Signed)
Mr. Daniel Wagner is a 81 year old individual whose had extensive thoracic and lumbar surgery.  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  He was to have an MRI of the thoracic and lumbar spines however he could not lie comfortably despite heavy sedation to obtain an adequate study.  Myelography is therefore been suggested in an effort to obtain adequate visualization of the pathologic areas. ? 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  Further evaluation with CT scan. ?

## 2021-07-01 NOTE — Progress Notes (Signed)
Client states turning in bed got skin tear about 2cm; no bleeding, bandaid given ?

## 2021-07-08 ENCOUNTER — Other Ambulatory Visit: Payer: Self-pay

## 2021-07-08 DIAGNOSIS — M542 Cervicalgia: Secondary | ICD-10-CM | POA: Insufficient documentation

## 2021-07-08 DIAGNOSIS — M79601 Pain in right arm: Secondary | ICD-10-CM | POA: Insufficient documentation

## 2021-07-08 DIAGNOSIS — M5136 Other intervertebral disc degeneration, lumbar region: Secondary | ICD-10-CM | POA: Insufficient documentation

## 2021-07-08 DIAGNOSIS — M4714 Other spondylosis with myelopathy, thoracic region: Secondary | ICD-10-CM | POA: Insufficient documentation

## 2021-07-08 DIAGNOSIS — M51369 Other intervertebral disc degeneration, lumbar region without mention of lumbar back pain or lower extremity pain: Secondary | ICD-10-CM | POA: Insufficient documentation

## 2021-07-09 ENCOUNTER — Other Ambulatory Visit: Payer: Self-pay | Admitting: Neurological Surgery

## 2021-07-13 NOTE — H&P (View-Only) (Signed)
[facility] [place] ? ?ASSESSMENT / PLAN: ?Daniel Wagner is a 81 y.o. male with plans to undergo anterior lumbar interbody fusion on Dr. Ellene Route with 07/30/21. I have reviewed the patient's imaging and feel it is safe to approach this anteriorly. I discussed the specific risks associated with spine exposure including vascular injury, urethral injury, bowel injury, nervous injury. I explained that if severe vascular injury was encountered we may need to abandon the spinal procedure to safely repair the injury. I explained the typical postoperative course for anterior exposure of the spine, including small risk of postoperative ileus. The patient was understanding and wished to proceed.  ? ?CHIEF COMPLAINT: back pain, multiple back surgeries ? ?HISTORY OF PRESENT ILLNESS: ?Daniel Wagner is a 81 y.o. male referred to clinic for discussion of anterior exposure of the L5/S1 disc base.  The patient has never had open abdominal surgery before.  He reports remote laparoscopic cholecystectomy.  He has no history of pelvic radiation.  He has had multiple spinal procedures throughout his life.  We reviewed the operative conduct and risks, benefits, and alternatives to anterior exposure. ? ?Past Medical History:  ?Diagnosis Date  ? Allergy   ? seasonal  ? Arthritis   ? Back pain   ? L5-S1  ? Cancer [facility])   ? 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 GERD (gastroesophageal reflux disease) Hx in past.  ? History of kidney stones 03/2013  ? Lithotrispy  ? Hyperlipidemia   ? Hypertension   ? Leg pain   ? While Walking  ? Sinus problem   ? ? ?Past Surgical History:  ?Procedure Laterality Date  ? ANTERIOR CERVICAL DECOMPRESSION/DISCECTOMY FUSION 4 LEVELS N/A 06/30/2012  ? Procedure: ANTERIOR CERVICAL DECOMPRESSION/DISCECTOMY FUSION 4 LEVELS;  Surgeon: Kristeen Miss, MD;  Location: [place] [facility];  Service: Neurosurgery;  Laterality: N/A;  C3-4 C4-5 C5-6 C6-7 Anterior cervical decompression/diskectomy/fusion  ?  APPLICATION OF ROBOTIC ASSISTANCE FOR SPINAL PROCEDURE N/A 06/26/2020  ? Procedure: APPLICATION OF ROBOTIC ASSISTANCE FOR SPINAL PROCEDURE;  Surgeon: Kristeen Miss, MD;  Location: [place];  Service: Neurosurgery;  Laterality: N/A;  ? BACK SURGERY  08/2010  ? lumbar fusion  ? CATARACT EXTRACTION, BILATERAL    ? CHOLECYSTECTOMY  1990's  ? COLONOSCOPY  02/04/2015  ? NECK SURGERY    ? x2  ? sinus surgeries    ? x2  ? SKIN CANCER EXCISION    ? arm chest and head basal cell  ? TRANSURETHRAL RESECTION OF PROSTATE  1999  ? ? ?Family History  ?Problem Relation Age of Onset  ? Stroke Mother   ? Heart disease Mother   ? Emphysema Father   ? Heart disease Sister   ? Tongue cancer Brother   ? Heart disease Brother   ? Colon cancer Neg Hx   ? Colon polyps Neg Hx   ? Esophageal cancer Neg Hx   ? Rectal cancer Neg Hx   ? Stomach cancer Neg Hx   ? ? ?Social History  ? ?Socioeconomic History  ? Marital status: Married  ?  Spouse name: Daniel Wagner  ? Number of children: 1  ? Years of education: Not on file  ? Highest education level: Not on file  ?Occupational History  ? Occupation: retired  ?Tobacco Use  ? Smoking status: Never  ? Smokeless tobacco: Never  ?Vaping Use  ? Vaping Use: Never used  ?Substance and Sexual Activity  ? Alcohol use: Yes  ?  Alcohol/week:  7.0 standard drinks  ?  Types: 7 Cans of beer per week  ? Drug use: No  ? Sexual activity: Not on file  ?Other Topics Concern  ? Not on file  ?Social History Narrative  ? ** Merged History Encounter **  ?    ? ?Social Determinants of Health  ? ?Financial Resource Strain: Not on file  ?Food Insecurity: Not on file  ?Transportation Needs: Not on file  ?Physical Activity: Not on file  ?Stress: Not on file  ?Social Connections: Not on file  ?Intimate Partner Violence: Not on file  ? ? ?Allergies  ?Allergen Reactions  ? Aspirin Other (See Comments)  ?  Jittery if taking large quantities  ? ? ?Current Outpatient Medications  ?Medication Sig Dispense Refill  ? atorvastatin (LIPITOR) 40 MG  tablet Take 40 mg by mouth daily.    ? Calcium Carb-Cholecalciferol (CALCIUM 600/VITAMIN D PO) Take 1 tablet by mouth daily.    ? Coenzyme Q10 (COQ10) 100 MG CAPS Take 100 mg by mouth daily.    ? CRANBERRY PO Take 1 tablet by mouth daily.    ? diclofenac Sodium (VOLTAREN) 1 % GEL Apply 1 application. topically daily.    ? dicyclomine (BENTYL) 10 MG capsule Take 10 mg by mouth 2 (two) times daily with breakfast and lunch.    ? doxazosin (CARDURA) 4 MG tablet Take 4 mg by mouth daily.     ? Echinacea 500 MG CAPS Take 500 mg by mouth in the morning and at bedtime.    ? esomeprazole (NEXIUM) 20 MG capsule Take 20 mg by mouth daily with breakfast.    ? glucosamine-chondroitin 500-400 MG tablet Take 1 tablet by mouth in the morning and at bedtime.    ? guaiFENesin (MUCINEX) 600 MG 12 hr tablet Take 600 mg by mouth 2 (two) times daily.    ? HYDROcodone-acetaminophen (NORCO) 7.5-325 MG tablet Take 1-2 tablets by mouth every 4 (four) hours as needed for severe pain.    ? lisinopril (PRINIVIL,ZESTRIL) 20 MG tablet Take 20 mg by mouth every evening.     ? Mag Aspart-Potassium Aspart (RA POTASSIUM/MAGNESIUM PO) Take 1 tablet by mouth daily.    ? meloxicam (MOBIC) 7.5 MG tablet Take 7.5 mg by mouth in the morning and at bedtime.    ? Menthol-Camphor (TIGER BALM ARTHRITIS RUB) 11-11 % CREA Apply 1 application. topically daily as needed (back pain).    ? niacin 500 MG tablet Take 500 mg by mouth daily.    ? Omega-3 Fatty Acids (SALMON OIL PO) Take 1 capsule by mouth at bedtime.    ? Polyethyl Glycol-Propyl Glycol 0.4-0.3 % SOLN Apply 1 drop to eye every 6 (six) hours as needed (dry eyes).    ? Probiotic Product (PRO-BIOTIC BLEND PO) Take 1 capsule by mouth daily.    ? Simethicone (GAS-X PO) Take 1 capsule by mouth 3 (three) times daily as needed (bloating). Uses [employer] brand    ? testosterone cypionate (DEPOTESTOTERONE CYPIONATE) 100 MG/ML injection Inject 100 mg into the muscle every 28 (twenty-eight) days. For IM use only    ?  Turmeric 500 MG CAPS Take 500 mg by mouth daily.    ? zinc gluconate 50 MG tablet Take 50 mg by mouth daily.    ? ?No current facility-administered medications for this visit.  ? ? ?PHYSICAL EXAM ?Vitals:  ? 07/14/21 1310  ?BP: 140/77  ?Pulse: 78  ?Resp: 20  ?Temp: 98.4 ?F (36.9 ?C)  ?SpO2: 96%  ?  Weight: 188 lb (85.3 kg)  ?Height: 6' (1.829 m)  ? ? ?Constitutional: Elderly, but well-appearing gentleman in no acute distress. ?Cardiac: regular rate and rhythm.  ?Respiratory:  unlabored. ?Abdominal:  soft, non-tender, non-distended.  ? ?PERTINENT LABORATORY AND RADIOLOGIC DATA ? ?Most recent CBC ? ?  Latest Ref Rng & Units 07/14/2021  ?  2:07 PM 06/27/2020  ?  2:31 AM 06/26/2020  ?  2:06 PM  ?CBC  ?WBC 4.0 - 10.5 K/uL 7.6   12.1     ?Hemoglobin 13.0 - 17.0 g/dL 14.7   10.8   11.6    ?Hematocrit 39.0 - 52.0 % 43.4   30.8   34.0    ?Platelets 150 - 400 K/uL 188   151     ?  ? ?Most recent CMP ? ?  Latest Ref Rng & Units 07/14/2021  ?  2:07 PM 06/27/2020  ?  2:31 AM 06/26/2020  ?  2:06 PM  ?CMP  ?Glucose 70 - 99 mg/dL 117   141     ?BUN 8 - 23 mg/dL 24   21     ?Creatinine 0.61 - 1.24 mg/dL 1.24   1.21     ?Sodium 135 - 145 mmol/L 138   132   137    ?Potassium 3.5 - 5.1 mmol/L 4.1   4.2   3.9    ?Chloride 98 - 111 mmol/L 106   102     ?CO2 22 - 32 mmol/L 25   22     ?Calcium 8.9 - 10.3 mg/dL 9.1   8.3     ? ?CT of the lumbar spine personally reviewed.  No aberrant vascular anatomy to preclude anterior closure.  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 ?Yevonne Aline. Stanford Breed, MD ?[facility] [place] ?Office Phone Number: [phone] ?07/14/2021 5:23 PM ? ?Total time spent on preparing this encounter including chart review, data review, collecting history, examining the patient, coordinating care for this new patient, 45 minutes. ? ?Portions of this report may have been transcribed using voice recognition software.  Every effort has been made to ensure accuracy; however, inadvertent computerized  transcription errors may still be present. ? ? ? ?

## 2021-07-13 NOTE — Pre-Procedure Instructions (Signed)
Surgical Instructions ? ? ? Your procedure is scheduled on Thursday, April 20th. ? Report to [facility] Main Entrance "A" at 05:30 A.M., then check in with the Admitting office. ? Call this number if you have problems the morning of surgery: ? [phone] ? ? If you have any questions prior to your surgery date call [phone]: Open Monday-Friday 8am-4pm ? ? ? Remember: ? Do not eat after midnight the night before your surgery ? ?You may drink clear liquids until 04:30 AM the morning of your surgery.   ?Clear liquids allowed are: Water, Non-Citrus Juices (without pulp), Carbonated Beverages, Clear Tea, Black Coffee Only (NO MILK, CREAM OR POWDERED CREAMER of any kind), and Gatorade. ?  ? Take these medicines the morning of surgery with A SIP OF WATER  ?atorvastatin (LIPITOR) ?dicyclomine (BENTYL)  ?doxazosin (CARDURA) ?esomeprazole ([place]) ? ?If needed: ?HYDROcodone-acetaminophen ([place])  ? ? ? ?As of today, STOP taking any Aspirin (unless otherwise instructed by your surgeon) Aleve, Naproxen, Ibuprofen, Motrin, Advil, Goody's, BC's, all herbal medications, fish oil, and all vitamins. This includes your diclofenac Sodium (VOLTAREN) 1 % GEL and meloxicam (MOBIC). ?         ?           ?Do NOT Smoke (Tobacco/Vaping) for 24 hours prior to your procedure. ? ?If you use a CPAP at night, you may bring your mask/headgear for your overnight stay. ?  ?Contacts, glasses, piercing's, hearing aid's, dentures or partials may not be worn into surgery, please bring cases for these belongings.  ?  ?For patients admitted to the hospital, discharge time will be determined by your treatment team. ?  ?Patients discharged the day of surgery will not be allowed to drive home, and someone needs to stay with them for 24 hours. ? ?SURGICAL WAITING ROOM VISITATION ?Patients having surgery or a procedure may have two support people in the waiting room. These visitors may be switched out with other visitors if needed. ?Children under the  age of 56 must have an adult accompany them who is not the patient. ?If the patient needs to stay at the hospital during part of their recovery, the visitor guidelines for inpatient rooms apply. ? ?Please refer to the [place] website for the visitor guidelines for Inpatients (after your surgery is over and you are in a regular room).  ? ? ?Special instructions:   ?[place]- Preparing For Surgery ? ?Before surgery, you can play an important role. Because skin is not sterile, your skin needs to be as free of germs as possible. You can reduce the number of germs on your skin by washing with CHG (chlorahexidine gluconate) Soap before surgery.  CHG is an antiseptic cleaner which kills germs and bonds with the skin to continue killing germs even after washing.   ? ?Oral Hygiene is also important to reduce your risk of infection.  Remember - BRUSH YOUR TEETH THE MORNING OF SURGERY WITH YOUR REGULAR TOOTHPASTE ? ?Please do not use if you have an allergy to CHG or antibacterial soaps. If your skin becomes reddened/irritated stop using the CHG.  ?Do not shave (including legs and underarms) for at least 48 hours prior to first CHG shower. It is OK to shave your face. ? ?Please follow these instructions carefully. ?  ?Shower the NIGHT BEFORE SURGERY and the MORNING OF SURGERY ? ?If you chose to wash your hair, wash your hair first as usual with your normal shampoo. ? ?After you shampoo, rinse your hair and  body thoroughly to remove the shampoo. ? ?Use CHG Soap as you would any other liquid soap. You can apply CHG directly to the skin and wash gently with a scrungie or a clean washcloth.  ? ?Apply the CHG Soap to your body ONLY FROM THE NECK DOWN.  Do not use on open wounds or open sores. Avoid contact with your eyes, ears, mouth and genitals (private parts). Wash Face and genitals (private parts)  with your normal soap.  ? ?Wash thoroughly, paying special attention to the area where your surgery will be  performed. ? ?Thoroughly rinse your body with warm water from the neck down. ? ?DO NOT shower/wash with your normal soap after using and rinsing off the CHG Soap. ? ?Pat yourself dry with a CLEAN TOWEL. ? ?Wear CLEAN PAJAMAS to bed the night before surgery ? ?Place CLEAN SHEETS on your bed the night before your surgery ? ?DO NOT SLEEP WITH PETS. ? ? ?Day of Surgery: ?Take a shower with CHG soap. ?Do not wear jewelry ?Do not wear lotions, powders, colognes, or deodorant. ?Do not shave 48 hours prior to surgery.  Men may shave face and neck. ?Do not bring valuables to the hospital. [facility] is not responsible for any belongings or valuables. ? ?Wear Clean/Comfortable clothing the morning of surgery ?Remember to brush your teeth WITH YOUR REGULAR TOOTHPASTE. ?  ?Please read over the following fact sheets that you were given. ? ? ? ?If you received a COVID test during your pre-op visit  it is requested that you wear a mask when out in public, stay away from anyone that may not be feeling well and notify your surgeon if you develop symptoms. If you have been in contact with anyone that has tested positive in the last 10 days please notify you surgeon.  ?

## 2021-07-13 NOTE — Progress Notes (Signed)
[facility] [place] ? ?ASSESSMENT / PLAN: ?Daniel Wagner is a 81 y.o. male with plans to undergo anterior lumbar interbody fusion on Dr. Ellene Route with 07/30/21. I have reviewed the patient's imaging and feel it is safe to approach this anteriorly. I discussed the specific risks associated with spine exposure including vascular injury, urethral injury, bowel injury, nervous injury. I explained that if severe vascular injury was encountered we may need to abandon the spinal procedure to safely repair the injury. I explained the typical postoperative course for anterior exposure of the spine, including small risk of postoperative ileus. The patient was understanding and wished to proceed.  ? ?CHIEF COMPLAINT: back pain, multiple back surgeries ? ?HISTORY OF PRESENT ILLNESS: ?Daniel Wagner is a 81 y.o. male referred to clinic for discussion of anterior exposure of the L5/S1 disc base.  The patient has never had open abdominal surgery before.  He reports remote laparoscopic cholecystectomy.  He has no history of pelvic radiation.  He has had multiple spinal procedures throughout his life.  We reviewed the operative conduct and risks, benefits, and alternatives to anterior exposure. ? ?Past Medical History:  ?Diagnosis Date  ? Allergy   ? seasonal  ? Arthritis   ? Back pain   ? L5-S1  ? Cancer [facility])   ? 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 GERD (gastroesophageal reflux disease) Hx in past.  ? History of kidney stones 03/2013  ? Lithotrispy  ? Hyperlipidemia   ? Hypertension   ? Leg pain   ? While Walking  ? Sinus problem   ? ? ?Past Surgical History:  ?Procedure Laterality Date  ? ANTERIOR CERVICAL DECOMPRESSION/DISCECTOMY FUSION 4 LEVELS N/A 06/30/2012  ? Procedure: ANTERIOR CERVICAL DECOMPRESSION/DISCECTOMY FUSION 4 LEVELS;  Surgeon: Kristeen Miss, MD;  Location: [place] [facility];  Service: Neurosurgery;  Laterality: N/A;  C3-4 C4-5 C5-6 C6-7 Anterior cervical decompression/diskectomy/fusion  ?  APPLICATION OF ROBOTIC ASSISTANCE FOR SPINAL PROCEDURE N/A 06/26/2020  ? Procedure: APPLICATION OF ROBOTIC ASSISTANCE FOR SPINAL PROCEDURE;  Surgeon: Kristeen Miss, MD;  Location: [place];  Service: Neurosurgery;  Laterality: N/A;  ? BACK SURGERY  08/2010  ? lumbar fusion  ? CATARACT EXTRACTION, BILATERAL    ? CHOLECYSTECTOMY  1990's  ? COLONOSCOPY  02/04/2015  ? NECK SURGERY    ? x2  ? sinus surgeries    ? x2  ? SKIN CANCER EXCISION    ? arm chest and head basal cell  ? TRANSURETHRAL RESECTION OF PROSTATE  1999  ? ? ?Family History  ?Problem Relation Age of Onset  ? Stroke Mother   ? Heart disease Mother   ? Emphysema Father   ? Heart disease Sister   ? Tongue cancer Brother   ? Heart disease Brother   ? Colon cancer Neg Hx   ? Colon polyps Neg Hx   ? Esophageal cancer Neg Hx   ? Rectal cancer Neg Hx   ? Stomach cancer Neg Hx   ? ? ?Social History  ? ?Socioeconomic History  ? Marital status: Married  ?  Spouse name: Hoyle Sauer  ? Number of children: 1  ? Years of education: Not on file  ? Highest education level: Not on file  ?Occupational History  ? Occupation: retired  ?Tobacco Use  ? Smoking status: Never  ? Smokeless tobacco: Never  ?Vaping Use  ? Vaping Use: Never used  ?Substance and Sexual Activity  ? Alcohol use: Yes  ?  Alcohol/week:  7.0 standard drinks  ?  Types: 7 Cans of beer per week  ? Drug use: No  ? Sexual activity: Not on file  ?Other Topics Concern  ? Not on file  ?Social History Narrative  ? ** Merged History Encounter **  ?    ? ?Social Determinants of Health  ? ?Financial Resource Strain: Not on file  ?Food Insecurity: Not on file  ?Transportation Needs: Not on file  ?Physical Activity: Not on file  ?Stress: Not on file  ?Social Connections: Not on file  ?Intimate Partner Violence: Not on file  ? ? ?Allergies  ?Allergen Reactions  ? Aspirin Other (See Comments)  ?  Jittery if taking large quantities  ? ? ?Current Outpatient Medications  ?Medication Sig Dispense Refill  ? atorvastatin (LIPITOR) 40 MG  tablet Take 40 mg by mouth daily.    ? Calcium Carb-Cholecalciferol (CALCIUM 600/VITAMIN D PO) Take 1 tablet by mouth daily.    ? Coenzyme Q10 (COQ10) 100 MG CAPS Take 100 mg by mouth daily.    ? CRANBERRY PO Take 1 tablet by mouth daily.    ? diclofenac Sodium (VOLTAREN) 1 % GEL Apply 1 application. topically daily.    ? dicyclomine (BENTYL) 10 MG capsule Take 10 mg by mouth 2 (two) times daily with breakfast and lunch.    ? doxazosin (CARDURA) 4 MG tablet Take 4 mg by mouth daily.     ? Echinacea 500 MG CAPS Take 500 mg by mouth in the morning and at bedtime.    ? esomeprazole (NEXIUM) 20 MG capsule Take 20 mg by mouth daily with breakfast.    ? glucosamine-chondroitin 500-400 MG tablet Take 1 tablet by mouth in the morning and at bedtime.    ? guaiFENesin (MUCINEX) 600 MG 12 hr tablet Take 600 mg by mouth 2 (two) times daily.    ? HYDROcodone-acetaminophen (NORCO) 7.5-325 MG tablet Take 1-2 tablets by mouth every 4 (four) hours as needed for severe pain.    ? lisinopril (PRINIVIL,ZESTRIL) 20 MG tablet Take 20 mg by mouth every evening.     ? Mag Aspart-Potassium Aspart (RA POTASSIUM/MAGNESIUM PO) Take 1 tablet by mouth daily.    ? meloxicam (MOBIC) 7.5 MG tablet Take 7.5 mg by mouth in the morning and at bedtime.    ? Menthol-Camphor (TIGER BALM ARTHRITIS RUB) 11-11 % CREA Apply 1 application. topically daily as needed (back pain).    ? niacin 500 MG tablet Take 500 mg by mouth daily.    ? Omega-3 Fatty Acids (SALMON OIL PO) Take 1 capsule by mouth at bedtime.    ? Polyethyl Glycol-Propyl Glycol 0.4-0.3 % SOLN Apply 1 drop to eye every 6 (six) hours as needed (dry eyes).    ? Probiotic Product (PRO-BIOTIC BLEND PO) Take 1 capsule by mouth daily.    ? Simethicone (GAS-X PO) Take 1 capsule by mouth 3 (three) times daily as needed (bloating). Uses [employer] brand    ? testosterone cypionate (DEPOTESTOTERONE CYPIONATE) 100 MG/ML injection Inject 100 mg into the muscle every 28 (twenty-eight) days. For IM use only    ?  Turmeric 500 MG CAPS Take 500 mg by mouth daily.    ? zinc gluconate 50 MG tablet Take 50 mg by mouth daily.    ? ?No current facility-administered medications for this visit.  ? ? ?PHYSICAL EXAM ?Vitals:  ? 07/14/21 1310  ?BP: 140/77  ?Pulse: 78  ?Resp: 20  ?Temp: 98.4 ?F (36.9 ?C)  ?SpO2: 96%  ?  Weight: 188 lb (85.3 kg)  ?Height: 6' (1.829 m)  ? ? ?Constitutional: Elderly, but well-appearing gentleman in no acute distress. ?Cardiac: regular rate and rhythm.  ?Respiratory:  unlabored. ?Abdominal:  soft, non-tender, non-distended.  ? ?PERTINENT LABORATORY AND RADIOLOGIC DATA ? ?Most recent CBC ? ?  Latest Ref Rng & Units 07/14/2021  ?  2:07 PM 06/27/2020  ?  2:31 AM 06/26/2020  ?  2:06 PM  ?CBC  ?WBC 4.0 - 10.5 K/uL 7.6   12.1     ?Hemoglobin 13.0 - 17.0 g/dL 14.7   10.8   11.6    ?Hematocrit 39.0 - 52.0 % 43.4   30.8   34.0    ?Platelets 150 - 400 K/uL 188   151     ?  ? ?Most recent CMP ? ?  Latest Ref Rng & Units 07/14/2021  ?  2:07 PM 06/27/2020  ?  2:31 AM 06/26/2020  ?  2:06 PM  ?CMP  ?Glucose 70 - 99 mg/dL 117   141     ?BUN 8 - 23 mg/dL 24   21     ?Creatinine 0.61 - 1.24 mg/dL 1.24   1.21     ?Sodium 135 - 145 mmol/L 138   132   137    ?Potassium 3.5 - 5.1 mmol/L 4.1   4.2   3.9    ?Chloride 98 - 111 mmol/L 106   102     ?CO2 22 - 32 mmol/L 25   22     ?Calcium 8.9 - 10.3 mg/dL 9.1   8.3     ? ?CT of the lumbar spine personally reviewed.  No aberrant vascular anatomy to preclude anterior closure.  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 ?Yevonne Aline. Stanford Breed, MD ?[facility] [place] ?Office Phone Number: [phone] ?07/14/2021 5:23 PM ? ?Total time spent on preparing this encounter including chart review, data review, collecting history, examining the patient, coordinating care for this new patient, 45 minutes. ? ?Portions of this report may have been transcribed using voice recognition software.  Every effort has been made to ensure accuracy; however, inadvertent computerized  transcription errors may still be present. ? ? ? ?

## 2021-07-14 ENCOUNTER — Encounter (HOSPITAL_COMMUNITY)
Admission: RE | Admit: 2021-07-14 | Discharge: 2021-07-14 | Disposition: A | Discharge status: Home / Self Care | Payer: Medicare Other | Source: Ambulatory Visit | Attending: Neurological Surgery | Admitting: Neurological Surgery

## 2021-07-14 ENCOUNTER — Ambulatory Visit: Payer: Medicare Other | Admitting: Vascular Surgery

## 2021-07-14 ENCOUNTER — Encounter (HOSPITAL_COMMUNITY): Payer: Self-pay

## 2021-07-14 ENCOUNTER — Other Ambulatory Visit: Payer: Self-pay

## 2021-07-14 ENCOUNTER — Encounter: Payer: Self-pay | Admitting: Vascular Surgery

## 2021-07-14 VITALS — BP 140/77 | HR 78 | Temp 98.4°F | Resp 20 | Ht 72.0 in | Wt 188.0 lb

## 2021-07-14 VITALS — BP 131/69 | HR 80 | Temp 98.2°F | Resp 18 | Ht 72.0 in | Wt 188.6 lb

## 2021-07-14 DIAGNOSIS — M47816 Spondylosis without myelopathy or radiculopathy, lumbar region: Secondary | ICD-10-CM

## 2021-07-14 DIAGNOSIS — Z01818 Encounter for other preprocedural examination: Secondary | ICD-10-CM

## 2021-07-14 LAB — BASIC METABOLIC PANEL
Anion gap: 7 (ref 5–15)
BUN: 24 mg/dL — ABNORMAL HIGH (ref 8–23)
CO2: 25 mmol/L (ref 22–32)
Calcium: 9.1 mg/dL (ref 8.9–10.3)
Chloride: 106 mmol/L (ref 98–111)
Creatinine, Ser: 1.24 mg/dL (ref 0.61–1.24)
GFR, Estimated: 60 mL/min — ABNORMAL LOW (ref >60)
Glucose, Bld: 117 mg/dL — ABNORMAL HIGH (ref 70–99)
Potassium: 4.1 mmol/L (ref 3.5–5.1)
Sodium: 138 mmol/L (ref 135–145)

## 2021-07-14 LAB — CBC
HCT: 43.4 % (ref 39.0–52.0)
Hemoglobin: 14.7 g/dL (ref 13.0–17.0)
MCH: 33.3 pg (ref 26.0–34.0)
MCHC: 33.9 g/dL (ref 30.0–36.0)
MCV: 98.4 fL (ref 80.0–100.0)
Platelets: 188 10*3/uL (ref 150–400)
RBC: 4.41 MIL/uL (ref 4.22–5.81)
RDW: 13.7 % (ref 11.5–15.5)
WBC: 7.6 10*3/uL (ref 4.0–10.5)
nRBC: 0 % (ref 0.0–0.2)

## 2021-07-14 LAB — SURGICAL PCR SCREEN
MRSA, PCR: NEGATIVE
Staphylococcus aureus: POSITIVE — AB

## 2021-07-14 LAB — TYPE AND SCREEN
ABO/RH(D): A POS
Antibody Screen: NEGATIVE

## 2021-07-14 NOTE — Progress Notes (Addendum)
PCP:   Cher Nakai, MD ?Cardiologist:  Denies ? ?EKG:  07/14/21 ?CXR: na ?ECHO: denies ?Stress Test:  >10 years.  Normal per patient.  No follow-up needed.  ?Cardiac Cath:  denies ? ?Fasting Blood Sugar-  na ?Checks Blood Sugar__na_ times a day ? ?OSA/CPAP: No ? ?ASA/Blood Thinner: No ? ?Covid test not needed ? ?Anesthesia Review: Yes, abnormal EKG ? ?Patient denies shortness of breath, fever, cough, and chest pain at PAT appointment. ? ?Patient verbalized understanding of instructions provided today at the PAT appointment.  Patient asked to review instructions at home and day of surgery.   ?

## 2021-07-15 ENCOUNTER — Other Ambulatory Visit: Payer: Self-pay

## 2021-07-20 ENCOUNTER — Telehealth: Payer: Self-pay

## 2021-07-20 NOTE — Telephone Encounter (Signed)
Telephone call received from patient stating he's returning a call to nurse, Izora Gala but he was unsure if this was an old message prior to his office visit on 07/14/21 for upcoming surgery with Dr. Ellene Route and Stanford Breed. Informed patient that Izora Gala is out of the office this week. Patient denied having any questions. Advised will relay message for Izora Gala to call upon return to office. Patient voiced understanding.  ?

## 2021-07-30 ENCOUNTER — Inpatient Hospital Stay (HOSPITAL_COMMUNITY): Payer: Medicare Other

## 2021-07-30 ENCOUNTER — Encounter (HOSPITAL_COMMUNITY): Payer: Self-pay | Admitting: Neurological Surgery

## 2021-07-30 ENCOUNTER — Inpatient Hospital Stay (HOSPITAL_COMMUNITY)
Admission: RE | Admit: 2021-07-30 | Discharge: 2021-07-31 | DRG: 460 | Disposition: A | Discharge status: Home / Self Care | Payer: Medicare Other | Attending: Neurological Surgery | Admitting: Neurological Surgery

## 2021-07-30 ENCOUNTER — Other Ambulatory Visit: Payer: Self-pay

## 2021-07-30 ENCOUNTER — Encounter (HOSPITAL_COMMUNITY)
Admission: RE | Disposition: A | Discharge status: Home / Self Care | Payer: Self-pay | Source: Home / Self Care | Attending: Neurological Surgery

## 2021-07-30 ENCOUNTER — Inpatient Hospital Stay (HOSPITAL_COMMUNITY): Payer: Medicare Other | Admitting: Anesthesiology

## 2021-07-30 ENCOUNTER — Inpatient Hospital Stay (HOSPITAL_COMMUNITY): Payer: Medicare Other | Admitting: Physician Assistant

## 2021-07-30 DIAGNOSIS — Z886 Allergy status to analgesic agent status: Secondary | ICD-10-CM | POA: Diagnosis not present

## 2021-07-30 DIAGNOSIS — J302 Other seasonal allergic rhinitis: Secondary | ICD-10-CM | POA: Diagnosis present

## 2021-07-30 DIAGNOSIS — K219 Gastro-esophageal reflux disease without esophagitis: Secondary | ICD-10-CM | POA: Diagnosis present

## 2021-07-30 DIAGNOSIS — Z8249 Family history of ischemic heart disease and other diseases of the circulatory system: Secondary | ICD-10-CM

## 2021-07-30 DIAGNOSIS — M5416 Radiculopathy, lumbar region: Secondary | ICD-10-CM | POA: Diagnosis not present

## 2021-07-30 DIAGNOSIS — M4807 Spinal stenosis, lumbosacral region: Secondary | ICD-10-CM | POA: Diagnosis present

## 2021-07-30 DIAGNOSIS — E785 Hyperlipidemia, unspecified: Secondary | ICD-10-CM | POA: Diagnosis present

## 2021-07-30 DIAGNOSIS — M4727 Other spondylosis with radiculopathy, lumbosacral region: Secondary | ICD-10-CM

## 2021-07-30 DIAGNOSIS — Z981 Arthrodesis status: Secondary | ICD-10-CM

## 2021-07-30 DIAGNOSIS — M4726 Other spondylosis with radiculopathy, lumbar region: Secondary | ICD-10-CM | POA: Diagnosis present

## 2021-07-30 DIAGNOSIS — Z87442 Personal history of urinary calculi: Secondary | ICD-10-CM

## 2021-07-30 DIAGNOSIS — Z79899 Other long term (current) drug therapy: Secondary | ICD-10-CM

## 2021-07-30 DIAGNOSIS — M48061 Spinal stenosis, lumbar region without neurogenic claudication: Secondary | ICD-10-CM | POA: Diagnosis not present

## 2021-07-30 DIAGNOSIS — I10 Essential (primary) hypertension: Secondary | ICD-10-CM | POA: Diagnosis present

## 2021-07-30 DIAGNOSIS — Z85828 Personal history of other malignant neoplasm of skin: Secondary | ICD-10-CM | POA: Diagnosis not present

## 2021-07-30 DIAGNOSIS — M48062 Spinal stenosis, lumbar region with neurogenic claudication: Secondary | ICD-10-CM | POA: Diagnosis present

## 2021-07-30 HISTORY — PX: ABDOMINAL EXPOSURE: SHX5708

## 2021-07-30 HISTORY — PX: ANTERIOR LUMBAR FUSION: SHX1170

## 2021-07-30 SURGERY — ANTERIOR LUMBAR FUSION 1 LEVEL
Anesthesia: General | Site: Spine Lumbar

## 2021-07-30 MED ORDER — OXYCODONE HCL 5 MG/5ML PO SOLN: 5.0000 mg | Freq: Once | ORAL | Status: DC | PRN

## 2021-07-30 MED ORDER — ONDANSETRON HCL 4 MG PO TABS
4.0000 mg | ORAL_TABLET | Freq: Four times a day (QID) | ORAL | Status: DC | PRN
  Administered 2021-07-31: 4 mg via ORAL
  Filled 2021-07-30: qty 1

## 2021-07-30 MED ORDER — SODIUM CHLORIDE 0.9% FLUSH: 3.0000 mL | INTRAVENOUS | Status: DC | PRN

## 2021-07-30 MED ORDER — LACTATED RINGERS IV SOLN: INTRAVENOUS | Status: DC

## 2021-07-30 MED ORDER — PANTOPRAZOLE SODIUM 40 MG PO TBEC
40.0000 mg | DELAYED_RELEASE_TABLET | Freq: Every day | ORAL | Status: DC
  Administered 2021-07-31: 40 mg via ORAL
  Filled 2021-07-30: qty 1

## 2021-07-30 MED ORDER — LACTATED RINGERS IV SOLN: INTRAVENOUS | Status: DC | PRN

## 2021-07-30 MED ORDER — EPHEDRINE SULFATE-NACL 50-0.9 MG/10ML-% IV SOSY
PREFILLED_SYRINGE | INTRAVENOUS | Status: DC | PRN
Start: 2021-07-30 — End: 2021-07-30
  Administered 2021-07-30 (×2): 5 mg via INTRAVENOUS
  Administered 2021-07-30: 10 mg via INTRAVENOUS

## 2021-07-30 MED ORDER — DOCUSATE SODIUM 100 MG PO CAPS
100.0000 mg | ORAL_CAPSULE | Freq: Two times a day (BID) | ORAL | Status: DC
  Administered 2021-07-30 – 2021-07-31 (×3): 100 mg via ORAL
  Filled 2021-07-30 (×3): qty 1

## 2021-07-30 MED ORDER — FENTANYL CITRATE (PF) 100 MCG/2ML IJ SOLN
INTRAMUSCULAR | Status: AC
  Filled 2021-07-30: qty 2

## 2021-07-30 MED ORDER — ORAL CARE MOUTH RINSE: 15.0000 mL | Freq: Once | OROMUCOSAL | Status: AC

## 2021-07-30 MED ORDER — CEFAZOLIN SODIUM-DEXTROSE 2-4 GM/100ML-% IV SOLN
2.0000 g | INTRAVENOUS | Status: AC
  Administered 2021-07-30: 2 g via INTRAVENOUS
  Filled 2021-07-30: qty 100

## 2021-07-30 MED ORDER — ACETAMINOPHEN 325 MG PO TABS: 650.0000 mg | ORAL_TABLET | ORAL | Status: DC | PRN

## 2021-07-30 MED ORDER — ROCURONIUM BROMIDE 10 MG/ML (PF) SYRINGE
PREFILLED_SYRINGE | INTRAVENOUS | Status: AC
  Filled 2021-07-30: qty 10

## 2021-07-30 MED ORDER — MORPHINE SULFATE (PF) 2 MG/ML IV SOLN
2.0000 mg | INTRAVENOUS | Status: DC | PRN
  Administered 2021-07-30: 4 mg via INTRAVENOUS
  Filled 2021-07-30: qty 2

## 2021-07-30 MED ORDER — DEXAMETHASONE SODIUM PHOSPHATE 10 MG/ML IJ SOLN
INTRAMUSCULAR | Status: AC
  Filled 2021-07-30: qty 1

## 2021-07-30 MED ORDER — HYDROCODONE-ACETAMINOPHEN 7.5-325 MG PO TABS: 1.0000 | ORAL_TABLET | ORAL | Status: DC | PRN

## 2021-07-30 MED ORDER — ONDANSETRON HCL 4 MG/2ML IJ SOLN
INTRAMUSCULAR | Status: DC | PRN
Start: 2021-07-30 — End: 2021-07-30
  Administered 2021-07-30: 4 mg via INTRAVENOUS

## 2021-07-30 MED ORDER — FENTANYL CITRATE (PF) 100 MCG/2ML IJ SOLN: 25.0000 ug | INTRAMUSCULAR | Status: DC | PRN

## 2021-07-30 MED ORDER — OXYCODONE HCL 5 MG PO TABS: 5.0000 mg | ORAL_TABLET | Freq: Once | ORAL | Status: DC | PRN

## 2021-07-30 MED ORDER — SODIUM CHLORIDE 0.9% FLUSH
3.0000 mL | Freq: Two times a day (BID) | INTRAVENOUS | Status: DC
  Administered 2021-07-30 (×2): 3 mL via INTRAVENOUS

## 2021-07-30 MED ORDER — PHENYLEPHRINE 80 MCG/ML (10ML) SYRINGE FOR IV PUSH (FOR BLOOD PRESSURE SUPPORT)
PREFILLED_SYRINGE | INTRAVENOUS | Status: AC
  Filled 2021-07-30: qty 10

## 2021-07-30 MED ORDER — EPHEDRINE 5 MG/ML INJ
INTRAVENOUS | Status: AC
  Filled 2021-07-30: qty 5

## 2021-07-30 MED ORDER — CHLORHEXIDINE GLUCONATE CLOTH 2 % EX PADS
6.0000 | MEDICATED_PAD | Freq: Once | CUTANEOUS | Status: DC
Start: 2021-07-30 — End: 2021-07-30

## 2021-07-30 MED ORDER — PHENOL 1.4 % MT LIQD: 1.0000 | OROMUCOSAL | Status: DC | PRN

## 2021-07-30 MED ORDER — THROMBIN 5000 UNITS EX SOLR
CUTANEOUS | Status: AC
  Filled 2021-07-30: qty 5000

## 2021-07-30 MED ORDER — MENTHOL 3 MG MT LOZG: 1.0000 | LOZENGE | OROMUCOSAL | Status: DC | PRN

## 2021-07-30 MED ORDER — LIDOCAINE 2% (20 MG/ML) 5 ML SYRINGE
INTRAMUSCULAR | Status: DC | PRN
  Administered 2021-07-30: 60 mg via INTRAVENOUS

## 2021-07-30 MED ORDER — BUPIVACAINE HCL (PF) 0.5 % IJ SOLN
INTRAMUSCULAR | Status: DC | PRN
  Administered 2021-07-30: 10 mL

## 2021-07-30 MED ORDER — ACETAMINOPHEN 650 MG RE SUPP: 650.0000 mg | RECTAL | Status: DC | PRN

## 2021-07-30 MED ORDER — THROMBIN 20000 UNITS EX SOLR: CUTANEOUS | Status: DC | PRN

## 2021-07-30 MED ORDER — 0.9 % SODIUM CHLORIDE (POUR BTL) OPTIME
TOPICAL | Status: DC | PRN
  Administered 2021-07-30: 1000 mL

## 2021-07-30 MED ORDER — DEXAMETHASONE SODIUM PHOSPHATE 10 MG/ML IJ SOLN
INTRAMUSCULAR | Status: DC | PRN
  Administered 2021-07-30: 10 mg via INTRAVENOUS

## 2021-07-30 MED ORDER — PROPOFOL 10 MG/ML IV BOLUS
INTRAVENOUS | Status: AC
  Filled 2021-07-30: qty 20

## 2021-07-30 MED ORDER — SENNA 8.6 MG PO TABS
1.0000 | ORAL_TABLET | Freq: Two times a day (BID) | ORAL | Status: DC
  Administered 2021-07-30 – 2021-07-31 (×3): 8.6 mg via ORAL
  Filled 2021-07-30 (×3): qty 1

## 2021-07-30 MED ORDER — THROMBIN 20000 UNITS EX SOLR
CUTANEOUS | Status: AC
  Filled 2021-07-30: qty 20000

## 2021-07-30 MED ORDER — ROCURONIUM BROMIDE 10 MG/ML (PF) SYRINGE
PREFILLED_SYRINGE | INTRAVENOUS | Status: DC | PRN
  Administered 2021-07-30 (×2): 30 mg via INTRAVENOUS
  Administered 2021-07-30: 20 mg via INTRAVENOUS
  Administered 2021-07-30: 30 mg via INTRAVENOUS
  Administered 2021-07-30: 80 mg via INTRAVENOUS

## 2021-07-30 MED ORDER — PHENYLEPHRINE HCL-NACL 20-0.9 MG/250ML-% IV SOLN
INTRAVENOUS | Status: DC | PRN
  Administered 2021-07-30: 20 ug/min via INTRAVENOUS

## 2021-07-30 MED ORDER — ONDANSETRON HCL 4 MG/2ML IJ SOLN: 4.0000 mg | Freq: Once | INTRAMUSCULAR | Status: DC | PRN

## 2021-07-30 MED ORDER — DICYCLOMINE HCL 10 MG PO CAPS
10.0000 mg | ORAL_CAPSULE | Freq: Two times a day (BID) | ORAL | Status: DC
  Administered 2021-07-31: 10 mg via ORAL
  Filled 2021-07-30: qty 1

## 2021-07-30 MED ORDER — POLYVINYL ALCOHOL 1.4 % OP SOLN
1.0000 [drp] | Freq: Four times a day (QID) | OPHTHALMIC | Status: DC | PRN
  Filled 2021-07-30: qty 15

## 2021-07-30 MED ORDER — CHLORHEXIDINE GLUCONATE CLOTH 2 % EX PADS: 6.0000 | MEDICATED_PAD | Freq: Once | CUTANEOUS | Status: DC

## 2021-07-30 MED ORDER — FENTANYL CITRATE (PF) 100 MCG/2ML IJ SOLN
INTRAMUSCULAR | Status: DC | PRN
  Administered 2021-07-30: 100 ug via INTRAVENOUS
  Administered 2021-07-30 (×2): 50 ug via INTRAVENOUS

## 2021-07-30 MED ORDER — HYDROCODONE-ACETAMINOPHEN 10-325 MG PO TABS
1.0000 | ORAL_TABLET | ORAL | Status: DC | PRN
  Administered 2021-07-30 (×2): 2 via ORAL
  Administered 2021-07-31: 1 via ORAL
  Administered 2021-07-31 (×4): 2 via ORAL
  Filled 2021-07-30 (×7): qty 2

## 2021-07-30 MED ORDER — LISINOPRIL 20 MG PO TABS
20.0000 mg | ORAL_TABLET | Freq: Every evening | ORAL | Status: DC
Start: 2021-07-30 — End: 2021-07-31
  Administered 2021-07-30: 20 mg via ORAL
  Filled 2021-07-30: qty 1

## 2021-07-30 MED ORDER — SODIUM CHLORIDE 0.9 % IV SOLN: 250.0000 mL | INTRAVENOUS | Status: DC

## 2021-07-30 MED ORDER — POLYETHYLENE GLYCOL 3350 17 G PO PACK: 17.0000 g | PACK | Freq: Every day | ORAL | Status: DC | PRN

## 2021-07-30 MED ORDER — METHOCARBAMOL 1000 MG/10ML IJ SOLN
500.0000 mg | Freq: Four times a day (QID) | INTRAVENOUS | Status: DC | PRN
  Filled 2021-07-30: qty 5

## 2021-07-30 MED ORDER — ATORVASTATIN CALCIUM 40 MG PO TABS
40.0000 mg | ORAL_TABLET | Freq: Every day | ORAL | Status: DC
  Administered 2021-07-30 – 2021-07-31 (×2): 40 mg via ORAL
  Filled 2021-07-30 (×2): qty 1

## 2021-07-30 MED ORDER — DOXAZOSIN MESYLATE 4 MG PO TABS
4.0000 mg | ORAL_TABLET | Freq: Every day | ORAL | Status: DC
  Administered 2021-07-31: 4 mg via ORAL
  Filled 2021-07-30: qty 1

## 2021-07-30 MED ORDER — BISACODYL 10 MG RE SUPP: 10.0000 mg | Freq: Every day | RECTAL | Status: DC | PRN

## 2021-07-30 MED ORDER — SUGAMMADEX SODIUM 200 MG/2ML IV SOLN
INTRAVENOUS | Status: DC | PRN
Start: 2021-07-30 — End: 2021-07-30
  Administered 2021-07-30: 200 mg via INTRAVENOUS

## 2021-07-30 MED ORDER — FENTANYL CITRATE (PF) 250 MCG/5ML IJ SOLN
INTRAMUSCULAR | Status: AC
  Filled 2021-07-30: qty 5

## 2021-07-30 MED ORDER — ONDANSETRON HCL 4 MG/2ML IJ SOLN: 4.0000 mg | Freq: Four times a day (QID) | INTRAMUSCULAR | Status: DC | PRN

## 2021-07-30 MED ORDER — BUPIVACAINE HCL (PF) 0.5 % IJ SOLN
INTRAMUSCULAR | Status: AC
  Filled 2021-07-30: qty 30

## 2021-07-30 MED ORDER — METHOCARBAMOL 500 MG PO TABS
500.0000 mg | ORAL_TABLET | Freq: Four times a day (QID) | ORAL | Status: DC | PRN
  Administered 2021-07-30 – 2021-07-31 (×3): 500 mg via ORAL
  Filled 2021-07-30 (×3): qty 1

## 2021-07-30 MED ORDER — ALBUMIN HUMAN 5 % IV SOLN: INTRAVENOUS | Status: DC | PRN

## 2021-07-30 MED ORDER — ONDANSETRON HCL 4 MG/2ML IJ SOLN
INTRAMUSCULAR | Status: AC
  Filled 2021-07-30: qty 2

## 2021-07-30 MED ORDER — POLYETHYL GLYCOL-PROPYL GLYCOL 0.4-0.3 % OP SOLN: 1.0000 [drp] | Freq: Four times a day (QID) | OPHTHALMIC | Status: DC | PRN

## 2021-07-30 MED ORDER — THROMBIN 5000 UNITS EX SOLR: OROMUCOSAL | Status: DC | PRN

## 2021-07-30 MED ORDER — SIMETHICONE 80 MG PO CHEW
80.0000 mg | CHEWABLE_TABLET | Freq: Four times a day (QID) | ORAL | Status: DC | PRN
  Filled 2021-07-30: qty 1

## 2021-07-30 MED ORDER — PROPOFOL 10 MG/ML IV BOLUS
INTRAVENOUS | Status: DC | PRN
  Administered 2021-07-30: 20 mg via INTRAVENOUS

## 2021-07-30 MED ORDER — CHLORHEXIDINE GLUCONATE 0.12 % MT SOLN
15.0000 mL | Freq: Once | OROMUCOSAL | Status: AC
  Administered 2021-07-30: 15 mL via OROMUCOSAL
  Filled 2021-07-30: qty 15

## 2021-07-30 MED ORDER — CEFAZOLIN SODIUM-DEXTROSE 2-4 GM/100ML-% IV SOLN
2.0000 g | Freq: Three times a day (TID) | INTRAVENOUS | Status: AC
  Administered 2021-07-30 (×2): 2 g via INTRAVENOUS
  Filled 2021-07-30 (×2): qty 100

## 2021-07-30 MED ORDER — FENTANYL CITRATE (PF) 100 MCG/2ML IJ SOLN
25.0000 ug | INTRAMUSCULAR | Status: DC | PRN
  Administered 2021-07-30 (×2): 50 ug via INTRAVENOUS

## 2021-07-30 MED ORDER — ALUM & MAG HYDROXIDE-SIMETH 200-200-20 MG/5ML PO SUSP: 30.0000 mL | Freq: Four times a day (QID) | ORAL | Status: DC | PRN

## 2021-07-30 MED ORDER — FLEET ENEMA 7-19 GM/118ML RE ENEM: 1.0000 | ENEMA | Freq: Once | RECTAL | Status: DC | PRN

## 2021-07-30 MED ORDER — GUAIFENESIN ER 600 MG PO TB12
600.0000 mg | ORAL_TABLET | Freq: Two times a day (BID) | ORAL | Status: DC
  Administered 2021-07-30 – 2021-07-31 (×2): 600 mg via ORAL
  Filled 2021-07-30 (×4): qty 1

## 2021-07-30 MED ORDER — LIDOCAINE-EPINEPHRINE 1 %-1:100000 IJ SOLN
INTRAMUSCULAR | Status: AC
  Filled 2021-07-30: qty 1

## 2021-07-30 MED ORDER — PHENYLEPHRINE 80 MCG/ML (10ML) SYRINGE FOR IV PUSH (FOR BLOOD PRESSURE SUPPORT)
PREFILLED_SYRINGE | INTRAVENOUS | Status: DC | PRN
  Administered 2021-07-30 (×3): 80 ug via INTRAVENOUS

## 2021-07-30 SURGICAL SUPPLY — 61 items
APPLIER CLIP 11 MED OPEN (CLIP) ×3
BAG COUNTER SPONGE SURGICOUNT (BAG) ×6 IMPLANT
BOLT ALIF MODULUS 5X25 2-PK (Bolt) ×2 IMPLANT
BONE MATRIX OSTEOCEL PRO LRG (Bone Implant) ×1 IMPLANT
BUR BARREL STRAIGHT FLUTE 4.0 (BURR) ×3 IMPLANT
BUR MATCHSTICK NEURO 3.0 LAGG (BURR) ×1 IMPLANT
CANISTER SUCT 3000ML PPV (MISCELLANEOUS) ×3 IMPLANT
CLIP APPLIE 11 MED OPEN (CLIP) ×4 IMPLANT
CLIP LIGATING EXTRA MED SLVR (CLIP) ×2 IMPLANT
COVER BACK TABLE 60X90IN (DRAPES) ×3 IMPLANT
DERMABOND ADVANCED (GAUZE/BANDAGES/DRESSINGS) ×1
DERMABOND ADVANCED .7 DNX12 (GAUZE/BANDAGES/DRESSINGS) ×2 IMPLANT
DRAPE C-ARM 42X72 X-RAY (DRAPES) ×3 IMPLANT
DRAPE C-ARMOR (DRAPES) ×3 IMPLANT
DRAPE LAPAROTOMY 100X72X124 (DRAPES) ×3 IMPLANT
DURAPREP 26ML APPLICATOR (WOUND CARE) ×3 IMPLANT
ELECT REM PT RETURN 9FT ADLT (ELECTROSURGICAL) ×3
ELECTRODE REM PT RTRN 9FT ADLT (ELECTROSURGICAL) ×2 IMPLANT
GLOVE BIO SURGEON STRL SZ7.5 (GLOVE) ×3 IMPLANT
GLOVE BIOGEL PI IND STRL 8.5 (GLOVE) IMPLANT
GLOVE BIOGEL PI INDICATOR 8.5 (GLOVE) ×1
GLOVE ECLIPSE 8.5 STRL (GLOVE) ×3 IMPLANT
GLOVE SS BIOGEL STRL SZ 7.5 (GLOVE) ×2 IMPLANT
GLOVE SUPERSENSE BIOGEL SZ 7.5 (GLOVE) ×1
GLOVE SURG SS PI 8.0 STRL IVOR (GLOVE) ×2 IMPLANT
GOWN STRL REUS W/ TWL LRG LVL3 (GOWN DISPOSABLE) ×2 IMPLANT
GOWN STRL REUS W/ TWL XL LVL3 (GOWN DISPOSABLE) ×4 IMPLANT
GOWN STRL REUS W/TWL 2XL LVL3 (GOWN DISPOSABLE) ×3 IMPLANT
GOWN STRL REUS W/TWL LRG LVL3 (GOWN DISPOSABLE)
GOWN STRL REUS W/TWL XL LVL3 (GOWN DISPOSABLE) ×9
GRAFT BONE PROTEIOS SM 1CC (Orthopedic Implant) ×1 IMPLANT
HEMOSTAT POWDER KIT SURGIFOAM (HEMOSTASIS) ×1 IMPLANT
KIT BASIN OR (CUSTOM PROCEDURE TRAY) ×3 IMPLANT
KIT TURNOVER KIT B (KITS) ×3 IMPLANT
NDL HYPO 25X1 1.5 SAFETY (NEEDLE) IMPLANT
NEEDLE HYPO 25X1 1.5 SAFETY (NEEDLE) ×3 IMPLANT
NS IRRIG 1000ML POUR BTL (IV SOLUTION) ×3 IMPLANT
PACK LAMINECTOMY NEURO (CUSTOM PROCEDURE TRAY) ×3 IMPLANT
PATTIES SURGICAL .5 X1 (DISPOSABLE) ×2 IMPLANT
SPACER ALIF MOD 6X42X32 10D (Spacer) ×1 IMPLANT
SPONGE INTESTINAL PEANUT (DISPOSABLE) ×8 IMPLANT
SPONGE SURGIFOAM ABS GEL 100 (HEMOSTASIS) ×3 IMPLANT
SPONGE T-LAP 18X18 ~~LOC~~+RFID (SPONGE) ×6 IMPLANT
SUT PDS AB 1 CTX 36 (SUTURE) ×5 IMPLANT
SUT PROLENE 4 0 RB 1 (SUTURE)
SUT PROLENE 4-0 RB1 .5 CRCL 36 (SUTURE) IMPLANT
SUT PROLENE 5 0 CC1 (SUTURE) IMPLANT
SUT PROLENE 6 0 BV (SUTURE) ×4 IMPLANT
SUT PROLENE 6 0 C 1 30 (SUTURE) ×2 IMPLANT
SUT SILK 3 0 TIES 10X30 (SUTURE) ×1 IMPLANT
SUT VIC AB 1 CT1 18XBRD ANBCTR (SUTURE) ×2 IMPLANT
SUT VIC AB 1 CT1 8-18 (SUTURE) ×3
SUT VIC AB 2-0 CP2 18 (SUTURE) ×3 IMPLANT
SUT VIC AB 3-0 SH 8-18 (SUTURE) ×3 IMPLANT
SUT VIC AB 4-0 RB1 18 (SUTURE) ×5 IMPLANT
SUT VICRYL 4-0 PS2 18IN ABS (SUTURE) IMPLANT
SYR 5ML LL (SYRINGE) ×1 IMPLANT
TOWEL GREEN STERILE (TOWEL DISPOSABLE) ×4 IMPLANT
TOWEL GREEN STERILE FF (TOWEL DISPOSABLE) ×7 IMPLANT
TRAY FOLEY MTR SLVR 16FR STAT (SET/KITS/TRAYS/PACK) ×3 IMPLANT
WATER STERILE IRR 1000ML POUR (IV SOLUTION) ×3 IMPLANT

## 2021-07-30 NOTE — Anesthesia Procedure Notes (Signed)
Procedure Name: Intubation ?Date/Time: 07/30/2021 7:59 AM ?Performed by: Barrington Ellison, CRNA ?Pre-anesthesia Checklist: Patient identified, Emergency Drugs available, Suction available and Patient being monitored ?Patient Re-evaluated:Patient Re-evaluated prior to induction ?Oxygen Delivery Method: Circle System Utilized ?Preoxygenation: Pre-oxygenation with 100% oxygen ?Induction Type: IV induction ?Ventilation: Mask ventilation without difficulty ?Laryngoscope Size: Mac and 4 ?Grade View: Grade I ?Tube type: Oral ?Tube size: 7.5 mm ?Number of attempts: 1 ?Airway Equipment and Method: Stylet and Oral airway ?Placement Confirmation: ETT inserted through vocal cords under direct vision, positive ETCO2 and breath sounds checked- equal and bilateral ?Secured at: 22 cm ?Tube secured with: Tape ?Dental Injury: Teeth and Oropharynx as per pre-operative assessment  ? ? ? ? ?

## 2021-07-30 NOTE — Anesthesia Postprocedure Evaluation (Signed)
Anesthesia Post Note ? ?Patient: Daniel Wagner ? ?Procedure(s) Performed: LUMBAR FIVE-SACRAL ONE ANTERIOR LUMBAR INTERBODY FUSION (Spine Lumbar) ?ABDOMINAL EXPOSURE ? ?  ? ?Patient location during evaluation: PACU ?Anesthesia Type: General ?Level of consciousness: awake and alert ?Pain management: pain level controlled ?Vital Signs Assessment: post-procedure vital signs reviewed and stable ?Respiratory status: spontaneous breathing, nonlabored ventilation and respiratory function stable ?Cardiovascular status: stable and blood pressure returned to baseline ?Anesthetic complications: no ? ? ?No notable events documented. ? ?Last Vitals:  ?Vitals:  ? 07/30/21 1230 07/30/21 1245  ?BP: 131/65 137/64  ?Pulse: 82 84  ?Resp: 15 13  ?Temp:    ?SpO2: 100% 100%  ?  ?Last Pain:  ?Vitals:  ? 07/30/21 1245  ?TempSrc:   ?PainSc: Asleep  ? ? ?LLE Motor Response: Purposeful movement (07/30/21 1245) ?LLE Sensation: Full sensation (07/30/21 1245) ?RLE Motor Response: Purposeful movement (07/30/21 1245) ?RLE Sensation: Full sensation (07/30/21 1245) ?  ?  ? ?Audry Pili ? ? ? ? ?

## 2021-07-30 NOTE — Anesthesia Preprocedure Evaluation (Addendum)
Anesthesia Evaluation  ?Patient identified by MRN, date of birth, ID band ?Patient awake ? ? ? ?Reviewed: ?Allergy & Precautions, NPO status , Patient's Chart, lab work & pertinent test results ? ?History of Anesthesia Complications ?Negative for: history of anesthetic complications ? ?Airway ?Mallampati: III ? ?TM Distance: >3 FB ?Neck ROM: Full ? ? ? Dental ? ?(+) Dental Advisory Given, Teeth Intact ?  ?Pulmonary ?neg pulmonary ROS,  ?  ?Pulmonary exam normal ? ? ? ? ? ? ? Cardiovascular ?hypertension, Pt. on medications ?Normal cardiovascular exam ? ? ?  ?Neuro/Psych ?negative neurological ROS ? negative psych ROS  ? GI/Hepatic ?Neg liver ROS, GERD  Controlled and Medicated,  ?Endo/Other  ?negative endocrine ROS ? Renal/GU ?negative Renal ROS  ? ?  ?Musculoskeletal ? ?(+) Arthritis ,  ? Abdominal ?  ?Peds ? Hematology ?negative hematology ROS ?(+)   ?Anesthesia Other Findings ? ? Reproductive/Obstetrics ? ?  ? ? ? ? ? ? ? ? ? ? ? ? ? ?  ?  ? ? ? ? ? ? ? ?Anesthesia Physical ?Anesthesia Plan ? ?ASA: 2 ? ?Anesthesia Plan: General  ? ?Post-op Pain Management: Tylenol PO (pre-op)*  ? ?Induction: Intravenous ? ?PONV Risk Score and Plan: 2 and Treatment may vary due to age or medical condition, Ondansetron, Dexamethasone and Propofol infusion ? ?Airway Management Planned: Oral ETT ? ?Additional Equipment:  ? ?Intra-op Plan:  ? ?Post-operative Plan: Extubation in OR ? ?Informed Consent: I have reviewed the patients History and Physical, chart, labs and discussed the procedure including the risks, benefits and alternatives for the proposed anesthesia with the patient or authorized representative who has indicated his/her understanding and acceptance.  ? ? ? ?Dental advisory given ? ?Plan Discussed with: CRNA and Anesthesiologist ? ?Anesthesia Plan Comments: ([facility])  ? ? ? ? ? ?Anesthesia Quick Evaluation ? ?

## 2021-07-30 NOTE — H&P (Signed)
Daniel Wagner is an 81 y.o. male.   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  His L5-S1 joint has now created significant spondylitic disease and he has chronic right lumbar radiculopathy.  Has been advised regarding surgical decompression arthrodesis and this will be done via an anterior lumbar interbody arthrodesis technique. ? ?Past Medical History:  ?Diagnosis Date  ? Allergy   ? seasonal  ? Arthritis   ? Back pain   ? L5-S1  ? Cancer [facility])   ? 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 GERD (gastroesophageal reflux disease) Hx in past.  ? History of kidney stones 03/2013  ? Lithotrispy  ? Hyperlipidemia   ? Hypertension   ? Leg pain   ? While Walking  ? Sinus problem   ? ? ?Past Surgical History:  ?Procedure Laterality Date  ? ANTERIOR CERVICAL DECOMPRESSION/DISCECTOMY FUSION 4 LEVELS N/A 06/30/2012  ? Procedure: ANTERIOR CERVICAL DECOMPRESSION/DISCECTOMY FUSION 4 LEVELS;  Surgeon: Kristeen Miss, MD;  Location: [place] [facility];  Service: Neurosurgery;  Laterality: N/A;  C3-4 C4-5 C5-6 C6-7 Anterior cervical decompression/diskectomy/fusion  ? APPLICATION OF ROBOTIC ASSISTANCE FOR SPINAL PROCEDURE N/A 06/26/2020  ? Procedure: APPLICATION OF ROBOTIC ASSISTANCE FOR SPINAL PROCEDURE;  Surgeon: Kristeen Miss, MD;  Location: [facility];  Service: Neurosurgery;  Laterality: N/A;  ? BACK SURGERY  08/2010  ? lumbar fusion  ? CATARACT EXTRACTION, BILATERAL    ? CHOLECYSTECTOMY  1990's  ? COLONOSCOPY  02/04/2015  ? NECK SURGERY    ? x2  ? sinus surgeries    ? x2  ? SKIN CANCER EXCISION    ? arm chest and head basal cell  ? TRANSURETHRAL RESECTION OF PROSTATE  1999  ? ? ?Family History  ?Problem Relation Age of Onset  ? Stroke Mother   ? Heart disease Mother   ? Emphysema Father   ? Heart disease Sister   ? Tongue cancer Brother   ?  Heart disease Brother   ? Colon cancer Neg Hx   ? Colon polyps Neg Hx   ? Esophageal cancer Neg Hx   ? Rectal cancer Neg Hx   ? Stomach cancer Neg Hx   ? ?Social History:  reports that he has never smoked. He has never used smokeless tobacco. He reports current alcohol use of about 7.0 standard drinks per week. He reports that he does not use drugs. ? ?Allergies:  ?Allergies  ?Allergen Reactions  ? Aspirin Other (See Comments)  ?  Jittery if taking large quantities  ? ? ?Medications Prior to Admission  ?Medication Sig Dispense Refill  ? atorvastatin (LIPITOR) 40 MG tablet Take 40 mg by mouth daily.    ? Calcium Carb-Cholecalciferol (CALCIUM 600/VITAMIN D PO) Take 1 tablet by mouth daily.    ? Coenzyme Q10 (COQ10) 100 MG CAPS Take 100 mg by mouth daily.    ? CRANBERRY PO Take 1 tablet by mouth daily.    ? diclofenac Sodium (VOLTAREN) 1 % GEL Apply 1 application. topically daily.    ? dicyclomine (BENTYL) 10 MG capsule Take 10 mg by mouth 2 (two) times daily with breakfast and lunch.    ? doxazosin (CARDURA) 4 MG tablet Take 4 mg by mouth daily.     ? Echinacea 500 MG CAPS Take 500 mg  by mouth in the morning and at bedtime.    ? esomeprazole (NEXIUM) 20 MG capsule Take 20 mg by mouth daily with breakfast.    ? glucosamine-chondroitin 500-400 MG tablet Take 1 tablet by mouth in the morning and at bedtime.    ? guaiFENesin (MUCINEX) 600 MG 12 hr tablet Take 600 mg by mouth 2 (two) times daily.    ? HYDROcodone-acetaminophen (NORCO) 7.5-325 MG tablet Take 1-2 tablets by mouth every 4 (four) hours as needed for severe pain.    ? lisinopril (PRINIVIL,ZESTRIL) 20 MG tablet Take 20 mg by mouth every evening.     ? Mag Aspart-Potassium Aspart (RA POTASSIUM/MAGNESIUM PO) Take 1 tablet by mouth daily.    ? meloxicam (MOBIC) 7.5 MG tablet Take 7.5 mg by mouth in the morning and at bedtime.    ? Menthol-Camphor (TIGER BALM ARTHRITIS RUB) 11-11 % CREA Apply 1 application. topically daily as needed (back pain).    ? niacin 500 MG  tablet Take 500 mg by mouth daily.    ? Omega-3 Fatty Acids (SALMON OIL PO) Take 1 capsule by mouth at bedtime.    ? Polyethyl Glycol-Propyl Glycol 0.4-0.3 % SOLN Apply 1 drop to eye every 6 (six) hours as needed (dry eyes).    ? Probiotic Product (PRO-BIOTIC BLEND PO) Take 1 capsule by mouth daily.    ? Simethicone (GAS-X PO) Take 1 capsule by mouth 3 (three) times daily as needed (bloating). Uses [employer] brand    ? testosterone cypionate (DEPOTESTOTERONE CYPIONATE) 100 MG/ML injection Inject 100 mg into the muscle every 28 (twenty-eight) days. For IM use only    ? Turmeric 500 MG CAPS Take 500 mg by mouth daily.    ? zinc gluconate 50 MG tablet Take 50 mg by mouth daily.    ? ? ?No results found for this or any previous visit (from the past 48 hour(s)). ?No results found. ? ?Review of Systems  ?Musculoskeletal:  Positive for back pain and gait problem.  ?Neurological:  Positive for weakness and numbness.  ?All other systems reviewed and are negative. ? ?Blood pressure (!) 149/75, pulse 76, temperature 98.4 ?F (36.9 ?C), temperature source Oral, resp. rate 17, height 6' (1.829 m), weight 85.3 kg, SpO2 96 %. ?Physical Exam ?Constitutional:   ?   Appearance: Normal appearance. He is normal weight.  ?HENT:  ?   Head: Normocephalic and atraumatic.  ?   Right Ear: Tympanic membrane normal.  ?   Left Ear: Tympanic membrane normal.  ?   Nose: Nose normal.  ?   Mouth/Throat:  ?   Mouth: Mucous membranes are moist.  ?Eyes:  ?   Extraocular Movements: Extraocular movements intact.  ?   Conjunctiva/sclera: Conjunctivae normal.  ?   Pupils: Pupils are equal, round, and reactive to light.  ?Cardiovascular:  ?   Rate and Rhythm: Normal rate and regular rhythm.  ?   Pulses: Normal pulses.  ?   Heart sounds: Normal heart sounds.  ?Pulmonary:  ?   Effort: Pulmonary effort is normal.  ?   Breath sounds: Normal breath sounds.  ?Abdominal:  ?   General: Abdomen is flat.  ?   Palpations: Abdomen is soft.  ?Musculoskeletal:  ?    Cervical back: Normal range of motion and neck supple.  ?   Comments: Positive straight leg raising at 30 degrees on the right 45 degrees on the left  ?Skin: ?   General: Skin is warm and dry.  ?  Capillary Refill: Capillary refill takes less than 2 seconds.  ?Neurological:  ?   Mental Status: He is alert.  ?   Comments: Mild weakness in the right gastroc at 4 out of 5 compared to the left absent reflexes in both Achilles absent reflexes in both patellae.  Upper extremity strength and reflexes intact cranial nerve examination normal Station and gait are intact  ?Psychiatric:     ?   Mood and Affect: Mood normal.     ?   Behavior: Behavior normal.     ?   Thought Content: Thought content normal.     ?   Judgment: Judgment normal.  ?  ? 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 ? ?Plan anterior lumbar decompression arthrodesis L5-S1 ? ?Earleen Newport, MD ?07/30/2021, 7:39 AM ? ? ? ?

## 2021-07-30 NOTE — Transfer of Care (Signed)
Immediate Anesthesia Transfer of Care Note ? ?Patient: Daniel Wagner ? ?Procedure(s) Performed: LUMBAR FIVE-SACRAL ONE ANTERIOR LUMBAR INTERBODY FUSION (Spine Lumbar) ?ABDOMINAL EXPOSURE ? ?Patient Location: PACU ? ?Anesthesia Type:General ? ?Level of Consciousness: awake, alert  and oriented ? ?Airway & Oxygen Therapy: Patient Spontanous Breathing ? ?Post-op Assessment: Report given to RN and Patient moving all extremities X 4 ? ?Post vital signs: Reviewed and stable ? ?Last Vitals:  ?Vitals Value Taken Time  ?BP 130/81 07/30/21 1142  ?Temp    ?Pulse 83 07/30/21 1143  ?Resp    ?SpO2 98 % 07/30/21 1143  ?Vitals shown include unvalidated device data. ? ?Last Pain:  ?Vitals:  ? 07/30/21 0630  ?TempSrc:   ?PainSc: 8   ?   ? ?  ? ?Complications: No notable events documented. ?

## 2021-07-30 NOTE — Progress Notes (Signed)
Orthopedic Tech Progress Note ?Patient Details:  ?Daniel Wagner ?07-11-1940 ?[id number] ? ?Patient ID: [name], male   DOB: [birth year], 81 y.o.   MRN: [id number] ?I spoke with the RN. They said the patient already had the brace. ?Karolee Stamps ?07/30/2021, 1:57 PM ? ?

## 2021-07-30 NOTE — Op Note (Signed)
Date of surgery: 07/30/2021 ?Preoperative diagnosis: Spondylosis and stenosis with radiculopathy L5-S1.  History of arthrodesis T10-L5. ?Postoperative diagnosis: Same ?Procedure: Anterior lumbar discectomy and decompression L5-S1 with arthrodesis using modulus anterior lumbar interbody fusion spacer. ?Surgeon: Kristeen Miss ?Approach surgeon: Dr. Lind Guest ?Anesthesia: General endotracheal ?Indications: Daniel Wagner is a 81 year old individuals had extensive surgery in the thoracolumbar spine for decompression stabilization of a progressive degenerative scoliosis with stenosis.  He has recovered well each time but now has had some recurrent pain now has advanced spondylitic degenerative changes at the L5-S1 level.  Has been advised regarding surgical decompression stabilization at the L5-S1 level. ? ?Procedure: The patient was brought to the operating room placed on table supine position.  After the placement of appropriate monitoring lines and a Foley catheter the anterior abdomen was prepped with alcohol DuraPrep and draped in a sterile fashion after localizing the approach to L5-S1.  Dr. Orlean Patten then performed the anterior approach to the retroperitoneal space and isolated the L5-S1 disc space.  Once the retractors were in place and the disc base was isolated I opened the anterior longitudinal ligament and removed a significant quantity of substantially degenerated desiccated disc material.  The endplates were noted to be very irregular and were curetted until the bony endplates were exposed then using a high-speed drill and a 3 mm dissecting tool the endplates were shaved smoothed.  A self-retaining disc retractor was placed into the area to allow opening of the region of the posterior longitudinal ligament.  This was done from left to right and both lateral recesses significant redundant ligamentous material with markedly desiccated disc material was removed.  In the end the endplates were completely shaved the lateral  recesses were well decompressed and hemostasis was obtained.  Then the interspace was sized and was felt that a medium size spacer measuring 42 x 32 mm in footprint with 6 mm in height posteriorly and 10 degrees of lordosis would fit best after checking AP and lateral fluoroscopy.  The spacer was then opened and filled with Osteocel mixed with Proteus for total volume of approximately 5 cc of Osteocel in the spacer and in the interspace.  Once the spacer was placed radiographically and positioning was confirmed it was locked into position with 5 x 25 mm bolts for these were used to into the vertebral body of L4 and to into the vertebral body of L5.  Final radiographs were obtained after removal of all the retractors during the procedure total blood loss was estimated at less than 250 cc.  The wound was noted to be hemostatic at the time of removal of the retractors and then the anterior rectus sheath was closed with #1 PDS in a running fashion 2-0 Vicryl was used in the subcutaneous tissues 3-0 Vicryl subcuticularly and Dermabond was placed on the skin.  10 cc of half percent Marcaine was injected into the paraspinous and rectus fascia.  Patient tolerated procedure well. ?

## 2021-07-30 NOTE — Op Note (Signed)
DATE OF SERVICE: 07/30/2021 ? ?PATIENT:  Daniel Wagner  81 y.o. male ? 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 ?POST-OPERATIVE DIAGNOSIS:  Same ? ?PROCEDURE:   ? anterior spinal exposure of L5/S1 disc space ? ?CO-SURGEONS:  Surgeon(s) and Role: ?Panel 1: ?   Kristeen Miss, MD - Primary ?Panel 2: ?   * Cherre Robins, MD - Primary ? ?An experienced assistant was required given the complexity of this procedure and the standard of surgical care. My assistant helped with exposure through counter tension, suctioning, ligation and retraction to better visualize the surgical field.  My assistant expedited sewing during the case by following my sutures. Wherever I use the term "we" in the report, my assistant actively helped me with that portion of the procedure. ? ?ANESTHESIA:   general ? ?EBL: 147m ? ?BLOOD ADMINISTERED:none ? ?DRAINS: none  ? ?LOCAL MEDICATIONS USED:  NONE ? ?SPECIMEN:  none from my portion ? ?COUNTS: confirmed correct. ? ?TOURNIQUET:  none ? ?PATIENT DISPOSITION:  PACU - hemodynamically stable. ?  ?Delay start of Pharmacological VTE agent (>24hrs) due to surgical blood loss or risk of bleeding: no ? ?INDICATION FOR PROCEDURE: CNABEEL GLADSONis a 81y.o. 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 After careful discussion of risks, benefits, and alternatives the patient was offered anterior lumbar interbody fusion. We specifically discussed risk of bleeding, and risk of injury to retroperitoneal structures. The patient understood and wished to proceed. ? ?OPERATIVE FINDINGS: unremarkable anterior exposure of the L5/S1 disc space ? ?DESCRIPTION OF PROCEDURE: After identification of the patient in the pre-operative holding area, the patient was transferred to the operating room. The patient was positioned supine on the operating room table. Anesthesia was induced. The abdomen was prepped and draped in  standard fashion. A surgical pause was performed confirming correct patient, procedure, and operative location. ? ?Using intraoperative fluoroscopy, the skin overlying the L5/S1 disc base was marked transversely on the abdomen.  The abdomen was shaved, and prepped, and draped in standard fashion.  The preplanned incision was made with a 10 blade.  Incision was carried down through skin and subcutaneous tissue until the anterior rectus fascia was identified.  Fascia was incised longitudinally. ? ?The rectus muscle was mobilized circumferentially.  We entered the retroperitoneal space bluntly at the lateral margin of the rectus muscle.  The peritoneum was swept anteriorly and laterally.  The distinctive fat of the retroperitoneum was visualized.  The psoas muscle was visualized.  Heavily calcified iliac vein was identified.  Exposure was carried immediately over the left iliac vessels.  The L5/S1 disc space was palpated.  The middle sacral vessels were taken with bipolar cautery.  The prevertebral fascia was then incised in a plane developed over the disc base.  We confirmed our position with fluoroscopy and a spinal needle. ? ?At this point, the case was turned over to Dr. EEllene Routewho proceeded with anterior lumbar interbody fusion. ? ?TYevonne Aline HStanford Breed MD ?[facility]?Office Phone Number: [phone]?07/30/2021 10:09 AM ? ? ? ?

## 2021-07-30 NOTE — Interval H&P Note (Signed)
History and Physical Interval Note: ? ?07/30/2021 ?7:17 AM ? ?Daniel Wagner  has presented today for surgery, with the diagnosis of Lumbar spondylosis.  The various methods of treatment have been discussed with the patient and family. After consideration of risks, benefits and other options for treatment, the patient has consented to  Procedure(s): ?L5-S1 ALIF (N/A) ?ABDOMINAL EXPOSURE (N/A) as a surgical intervention.  The patient's history has been reviewed, patient examined, no change in status, stable for surgery.  I have reviewed the patient's chart and labs.  Questions were answered to the patient's satisfaction.   ? ? ?Cherre Robins ? ? ?

## 2021-07-31 ENCOUNTER — Encounter (HOSPITAL_COMMUNITY): Payer: Self-pay | Admitting: Neurological Surgery

## 2021-07-31 MED ORDER — METHOCARBAMOL 500 MG PO TABS: 500.0000 mg | ORAL_TABLET | Freq: Four times a day (QID) | ORAL | 3 refills | Status: DC | PRN

## 2021-07-31 MED ORDER — HYDROCODONE-ACETAMINOPHEN 10-325 MG PO TABS: 1.0000 | ORAL_TABLET | ORAL | 0 refills | Status: DC | PRN

## 2021-07-31 NOTE — Progress Notes (Signed)
Patient ID: Daniel Wagner, male   DOB: Aug 23, 1940, 81 y.o.   MRN: [id number] ? ?Up working with PT on my rounds. Looks and feels great. Tolerating diet. Has voided. No BM yet, but counseled this may take some time. No problems in the lower extremities. Legs well perfused. No incisional problems. Safe for discharge from my perspective.  ? ?Yevonne Aline. Stanford Breed, MD ?[facility] ?Office Phone Number: [phone] ?07/31/2021 8:46 AM ? ?

## 2021-07-31 NOTE — Evaluation (Signed)
Occupational Therapy Evaluation ?Patient Details ?Name: Daniel Wagner ?MRN: [id number] ?DOB: October 17, 1940 ?Today's Date: 07/31/2021 ? ? ?History of Present Illness 81 yo M s/p Anterior lumbar discectomy and decompression L5-S.  PMH includes: Arthritis, Back pain, GERD, Hyperlipidemia, Hypertension.  ? ?Clinical Impression ?  ?Patient admitted for the diagnosis above.  PTA he remains very active and needed no assist with ADL or mobility.  Primary deficit is expected post op discomfort, but he is very close to his baseline, and no further OT needs exist in the acute setting. All questions answered, and no further needs in the acute setting.     ?   ? ?Recommendations for follow up therapy are one component of a multi-disciplinary discharge planning process, led by the attending physician.  Recommendations may be updated based on patient status, additional functional criteria and insurance authorization.  ? ?Follow Up Recommendations ? No OT follow up  ?  ?Assistance Recommended at Discharge Intermittent Supervision/Assistance  ?Patient can return home with the following   ? ?  ?Functional Status Assessment ? Patient has had a recent decline in their functional status and demonstrates the ability to make significant improvements in function in a reasonable and predictable amount of time.  ?Equipment Recommendations ? None recommended by OT  ?  ?Recommendations for Other Services   ? ? ?  ?Precautions / Restrictions Precautions ?Precautions: Back ?Precaution Booklet Issued: Yes (comment) ?Precaution Comments: Verbalized understanding ?Required Braces or Orthoses: Spinal Brace ?Spinal Brace: Other (comment) ?Spinal Brace Comments: Wife is bringing ?Restrictions ?Weight Bearing Restrictions: No  ? ?  ? ?Mobility Bed Mobility ?  ?  ?  ?  ?  ?  ?  ?General bed mobility comments: Sitting EOB ?  ? ?Transfers ?Overall transfer level: Independent ?  ?  ?  ?  ?  ?  ?  ?  ?  ?  ? ?  ?Balance Overall balance assessment: No apparent  balance deficits (not formally assessed) ?  ?  ?  ?  ?  ?  ?  ?  ?  ?  ?  ?  ?  ?  ?  ?  ?  ?  ?   ? ?ADL either performed or assessed with clinical judgement  ? ?ADL Overall ADL's : At baseline ?  ?  ?  ?  ?  ?  ?  ?  ?  ?  ?  ?  ?  ?  ?  ?  ?  ?  ?  ?General ADL Comments: Min A for socks due to abdominal incision.  Will have assist as needed.  ? ? ? ?Vision Baseline Vision/History: 0 No visual deficits ?Patient Visual Report: No change from baseline ?   ?   ?Perception Perception ?Perception: Not tested ?  ?Praxis Praxis ?Praxis: Not tested ?  ? ?Pertinent Vitals/Pain Pain Assessment ?Pain Assessment: Faces ?Faces Pain Scale: Hurts little more ?Pain Location: Incision ?Pain Descriptors / Indicators: Tender ?Pain Intervention(s): Monitored during session, Patient requesting pain meds-RN notified  ? ? ? ?Hand Dominance Right ?  ?Extremity/Trunk Assessment Upper Extremity Assessment ?Upper Extremity Assessment: Overall WFL for tasks assessed ?  ?Lower Extremity Assessment ?Lower Extremity Assessment: Defer to PT evaluation ?  ?Cervical / Trunk Assessment ?Cervical / Trunk Assessment: Back Surgery ?  ?Communication Communication ?Communication: No difficulties ?  ?Cognition Arousal/Alertness: Awake/alert ?Behavior During Therapy: [facility] for tasks assessed/performed ?Overall Cognitive Status: Within Functional Limits for tasks assessed ?  ?  ?  ?  ?  ?  ?  ?  ?  ?  ?  ?  ?  ?  ?  ?  ?  ?  ?  ?  General Comments   VSS on RA ? ?  ?Exercises   ?  ?Shoulder Instructions    ? ? ?Home Living Family/patient expects to be discharged to:: Private residence ?Living Arrangements: Spouse/significant other ?Available Help at Discharge: Family;Available 24 hours/day ?Type of Home: House ?Home Access: Stairs to enter ?Entrance Stairs-Number of Steps: 1 ?Entrance Stairs-Rails: Right ?Home Layout: One level ?  ?  ?Bathroom Shower/Tub: Walk-in shower ?  ?Bathroom Toilet: Handicapped height ?Bathroom Accessibility: Yes ?How Accessible:  Accessible via walker ?Home Equipment: Conservation officer, nature (2 wheels);Hand held shower head;Shower seat ?  ?  ?  ? ?  ?Prior Functioning/Environment Prior Level of Function : Independent/Modified Independent;Driving ?  ?  ?  ?  ?  ?  ?  ?  ?  ? ?  ?  ?OT Problem List: Pain ?  ?   ?OT Treatment/Interventions:    ?  ?OT Goals(Current goals can be found in the care plan section) Acute Rehab OT Goals ?Patient Stated Goal: Return home this afternoon ?OT Goal Formulation: With patient ?Time For Goal Achievement: 08/03/21 ?Potential to Achieve Goals: Good  ?OT Frequency:   ?  ? ?Co-evaluation   ?  ?  ?  ?  ? ?  ?AM-PAC OT "6 Clicks" Daily Activity     ?Outcome Measure Help from another person eating meals?: None ?Help from another person taking care of personal grooming?: None ?Help from another person toileting, which includes using toliet, bedpan, or urinal?: None ?Help from another person bathing (including washing, rinsing, drying)?: A Little ?Help from another person to put on and taking off regular upper body clothing?: None ?Help from another person to put on and taking off regular lower body clothing?: A Little ?6 Click Score: 22 ?  ?End of Session Nurse Communication: Mobility status ? ?Activity Tolerance: Patient tolerated treatment well ?Patient left: in chair ? ?OT Visit Diagnosis: Pain  ?              ?Time: 5409-8119 ?OT Time Calculation (min): 19 min ?Charges:  OT General Charges ?$OT Visit: 1 Visit ?OT Evaluation ?$OT Eval Moderate Complexity: 1 Mod ? ?07/31/2021 ? ?RP, OTR/L ? ?Acute Rehabilitation Services ? ?Office:  [phone] ? ? ?Jhonatan Lomeli D Lakeya Mulka ?07/31/2021, 8:52 AM ?

## 2021-07-31 NOTE — Discharge Summary (Signed)
Physician Discharge Summary  ?Patient ID: ?Jon Gills ?MRN: [id number] ?DOB/AGE: 1940-09-13 81 y.o. ? ?Admit date: 07/30/2021 ?Discharge date: 07/31/2021 ? ?Admission Diagnoses: Lumbar spondylosis with radiculopathy L5-S1.  History of arthrodesis T10-L L5 ? ?Discharge Diagnoses: Lumbar spondylosis with radiculopathy L5-S1.  History of arthrodesis T10-L5 ?Principal Problem: ?  Lumbar stenosis with neurogenic claudication ? ? ?Discharged Condition: good ? ?Hospital Course: Patient was admitted to undergo surgical decompression arthrodesis at L5-S1 via an anterior lumbar interbody arthrodesis he tolerated surgery well. ? ?Consults: vascular surgery ? ?Significant Diagnostic Studies: None ? ?Treatments: surgery: See op note ? ?Discharge Exam: ?Blood pressure (!) 94/58, pulse 69, temperature 98 ?F (36.7 ?C), temperature source Oral, resp. rate 18, height 6' (1.829 m), weight 85.3 kg, SpO2 100 %. ?Incision is clean and dry motor function is intact.  Station and gait are intact. ? ?Disposition: Discharge disposition: 01-Home or Self Care ? ? ? ? ? ? ?Discharge Instructions   ? ? Call MD for:  redness, tenderness, or signs of infection (pain, swelling, redness, odor or green/yellow discharge around incision site)   Complete by: As directed ?  ? Call MD for:  severe uncontrolled pain   Complete by: As directed ?  ? Call MD for:  temperature >100.4   Complete by: As directed ?  ? Diet - low sodium heart healthy   Complete by: As directed ?  ? Discharge wound care:   Complete by: As directed ?  ? Okay to shower. Do not apply salves or appointments to incision. No heavy lifting with the upper extremities greater than 10 pounds. May resume driving when not requiring pain medication and patient feels comfortable with doing so.  ? Incentive spirometry RT   Complete by: As directed ?  ? Increase activity slowly   Complete by: As directed ?  ? ?  ? ?Allergies as of 07/31/2021   ? ?   Reactions  ? Aspirin Other (See Comments)  ? Jittery  if taking large quantities  ? ?  ? ?  ?Medication List  ?  ? ?STOP taking these medications   ? ?HYDROcodone-acetaminophen 7.5-325 MG tablet ?Commonly known as: NORCO ?Replaced by: HYDROcodone-acetaminophen 10-325 MG tablet ?  ? ?  ? ?TAKE these medications   ? ?atorvastatin 40 MG tablet ?Commonly known as: LIPITOR ?Take 40 mg by mouth daily. ?  ?CALCIUM 600/VITAMIN D PO ?Take 1 tablet by mouth daily. ?  ?CoQ10 100 MG Caps ?Take 100 mg by mouth daily. ?  ?CRANBERRY PO ?Take 1 tablet by mouth daily. ?  ?diclofenac Sodium 1 % Gel ?Commonly known as: VOLTAREN ?Apply 1 application. topically daily. ?  ?dicyclomine 10 MG capsule ?Commonly known as: BENTYL ?Take 10 mg by mouth 2 (two) times daily with breakfast and lunch. ?  ?doxazosin 4 MG tablet ?Commonly known as: CARDURA ?Take 4 mg by mouth daily. ?  ?Echinacea 500 MG Caps ?Take 500 mg by mouth in the morning and at bedtime. ?  ?esomeprazole 20 MG capsule ?Commonly known as: [place] ?Take 20 mg by mouth daily with breakfast. ?  ?GAS-X PO ?Take 1 capsule by mouth 3 (three) times daily as needed (bloating). Uses [employer] brand ?  ?glucosamine-chondroitin 500-400 MG tablet ?Take 1 tablet by mouth in the morning and at bedtime. ?  ?guaiFENesin 600 MG 12 hr tablet ?Commonly known as: [place] ?Take 600 mg by mouth 2 (two) times daily. ?  ?HYDROcodone-acetaminophen 10-325 MG tablet ?Commonly known as: NORCO ?Take 1 tablet by mouth  every 4 (four) hours as needed for moderate pain or severe pain. ?Replaces: HYDROcodone-acetaminophen 7.5-325 MG tablet ?  ?lisinopril 20 MG tablet ?Commonly known as: ZESTRIL ?Take 20 mg by mouth every evening. ?  ?meloxicam 7.5 MG tablet ?Commonly known as: MOBIC ?Take 7.5 mg by mouth in the morning and at bedtime. ?  ?methocarbamol 500 MG tablet ?Commonly known as: ROBAXIN ?Take 1 tablet (500 mg total) by mouth every 6 (six) hours as needed for muscle spasms. ?  ?niacin 500 MG tablet ?Take 500 mg by mouth daily. ?  ?Polyethyl Glycol-Propyl Glycol  0.4-0.3 % Soln ?Apply 1 drop to eye every 6 (six) hours as needed (dry eyes). ?  ?PRO-BIOTIC BLEND PO ?Take 1 capsule by mouth daily. ?  ?RA POTASSIUM/MAGNESIUM PO ?Take 1 tablet by mouth daily. ?  ?SALMON OIL PO ?Take 1 capsule by mouth at bedtime. ?  ?testosterone cypionate 100 MG/ML injection ?Commonly known as: DEPOTESTOTERONE CYPIONATE ?Inject 100 mg into the muscle every 28 (twenty-eight) days. For IM use only ?  ?Tiger Balm Arthritis Rub 11-11 % Crea ?Generic drug: Menthol-Camphor ?Apply 1 application. topically daily as needed (back pain). ?  ?Turmeric 500 MG Caps ?Take 500 mg by mouth daily. ?  ?zinc gluconate 50 MG tablet ?Take 50 mg by mouth daily. ?  ? ?  ? ?  ?  ? ? ?  ?Discharge Care Instructions  ?(From admission, onward)  ?  ? ? ?  ? ?  Start     Ordered  ? 07/31/21 0000  Discharge wound care:       ?Comments: Okay to shower. Do not apply salves or appointments to incision. No heavy lifting with the upper extremities greater than 10 pounds. May resume driving when not requiring pain medication and patient feels comfortable with doing so.  ? 07/31/21 1729  ? ?  ?  ? ?  ? ? ? ?Signed: ?Earleen Newport ?07/31/2021, 5:29 PM ? ? ?

## 2021-07-31 NOTE — Plan of Care (Signed)
Pt doing well. Pt and family given D/C instructions with verbal understanding. Rx's were sent to the pharmacy by MD. Pt's incision is clean and dry with no sign of infection. Pt's IV was removed prior to D/C. Pt D/C'd home via wheelchair per MD order. Pt is stable @ D/C and has no other needs at this time. Serafin Decatur, RN  

## 2021-07-31 NOTE — Progress Notes (Signed)
**Note Daniel-Identified via Obfuscation** PT Cancellation Note ? ?Patient Details ?Name: Daniel Wagner ?MRN: [id number] ?DOB: [birth year] ? ? ?Cancelled Treatment:    Reason Eval/Treat Not Completed: PT screened, no needs identified, will sign off Per OT, patient independent with mobility and close to baseline post spinal surgery. All education completed by OT. No skilled PT needs identified. PT will sign off.  ? ?Breanna Shorkey A. Gilford Rile, PT, DPT ?Acute Rehabilitation Services ?Pager [phone] ?Office [phone] ? ? ? ?Daniel Wagner ?07/31/2021, 9:01 AM ?

## 2021-08-05 MED FILL — Sodium Chloride IV Soln 0.9%: INTRAVENOUS | Qty: 2000 | Status: AC

## 2021-08-05 MED FILL — Heparin Sodium (Porcine) Inj 1000 Unit/ML: INTRAMUSCULAR | Qty: 30 | Status: AC

## 2022-04-27 ENCOUNTER — Other Ambulatory Visit (HOSPITAL_COMMUNITY): Payer: Self-pay | Admitting: Neurological Surgery

## 2022-04-27 DIAGNOSIS — M4804 Spinal stenosis, thoracic region: Secondary | ICD-10-CM

## 2022-04-28 ENCOUNTER — Other Ambulatory Visit: Payer: Self-pay

## 2022-04-28 ENCOUNTER — Ambulatory Visit (HOSPITAL_COMMUNITY)
Admission: RE | Admit: 2022-04-28 | Discharge: 2022-04-28 | Disposition: A | Discharge status: Home / Self Care | Payer: Medicare Other | Source: Ambulatory Visit | Attending: Neurological Surgery | Admitting: Neurological Surgery

## 2022-04-28 ENCOUNTER — Other Ambulatory Visit (HOSPITAL_COMMUNITY): Payer: Self-pay | Admitting: Neurological Surgery

## 2022-04-28 DIAGNOSIS — M48061 Spinal stenosis, lumbar region without neurogenic claudication: Secondary | ICD-10-CM | POA: Diagnosis present

## 2022-04-28 DIAGNOSIS — M47812 Spondylosis without myelopathy or radiculopathy, cervical region: Secondary | ICD-10-CM | POA: Diagnosis not present

## 2022-04-28 DIAGNOSIS — M4804 Spinal stenosis, thoracic region: Secondary | ICD-10-CM

## 2022-04-28 DIAGNOSIS — Z981 Arthrodesis status: Secondary | ICD-10-CM | POA: Insufficient documentation

## 2022-04-28 DIAGNOSIS — M5116 Intervertebral disc disorders with radiculopathy, lumbar region: Secondary | ICD-10-CM | POA: Insufficient documentation

## 2022-04-28 MED ORDER — HYDROCODONE-ACETAMINOPHEN 5-325 MG PO TABS
1.0000 | ORAL_TABLET | ORAL | Status: DC | PRN
  Administered 2022-04-28: 2 via ORAL
  Filled 2022-04-28: qty 2

## 2022-04-28 MED ORDER — LIDOCAINE HCL (PF) 1 % IJ SOLN
5.0000 mL | Freq: Once | INTRAMUSCULAR | Status: AC
Start: 2022-04-28 — End: 2022-04-28
  Administered 2022-04-28: 3 mL via INTRADERMAL

## 2022-04-28 MED ORDER — ONDANSETRON HCL 4 MG/2ML IJ SOLN: 4.0000 mg | Freq: Four times a day (QID) | INTRAMUSCULAR | Status: DC | PRN

## 2022-04-28 MED ORDER — DIAZEPAM 5 MG PO TABS
10.0000 mg | ORAL_TABLET | Freq: Once | ORAL | Status: AC
Start: 2022-04-28 — End: 2022-04-28

## 2022-04-28 MED ORDER — IOHEXOL 300 MG/ML  SOLN
10.0000 mL | Freq: Once | INTRAMUSCULAR | Status: AC | PRN
  Administered 2022-04-28: 9 mL via INTRATHECAL

## 2022-04-28 MED ORDER — DIAZEPAM 5 MG PO TABS
ORAL_TABLET | ORAL | Status: AC
Start: 2022-04-28 — End: 2022-04-28
  Administered 2022-04-28: 10 mg via ORAL
  Filled 2022-04-28: qty 2

## 2022-04-28 NOTE — Discharge Instructions (Signed)
Myelogram and Lumbar Puncture Discharge Instructions  Go home and rest quietly for the next 24 hours.  It is important to lie flat for the next 24 hours.  Get up only to go to the restroom.  You may lie in the bed or on a couch on your back, your stomach, your left side or your right side.  You may have one pillow under your head.  You may have pillows between your knees while you are on your side or under your knees while you are on your back.  DO NOT drive today.  Recline the seat as far back as it will go, while still wearing your seat belt, on the way home.  You may get up to go to the bathroom as needed.  You may sit up for 10 minutes to eat.  You may resume your normal diet and medications unless otherwise indicated.  The incidence of headache, nausea, or vomiting is about 5% (one in 20 patients).  If you develop a headache, lie flat and drink plenty of fluids until the headache goes away.  Caffeinated beverages may be helpful.  If you develop severe nausea and vomiting or a headache that does not go away with flat bed rest, call [phone].  You may resume normal activities after your 24 hours of bed rest is over; however, do not exert yourself strongly or do any heavy lifting tomorrow.  Call your physician for a follow-up appointment.  The results of your myelogram will be sent directly to your physician by the following day.  If you have any questions or if complications develop after you arrive home, please call [phone].  Discharge instructions have been explained to the patient.  The patient, or the person responsible for the patient, fully understands these instructions.

## 2022-04-28 NOTE — Procedures (Signed)
Mr. Daniel Wagner is a 82 year old individual who has had extensive surgery throughout the thoracic and lumbar spine.  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  An MRI was ordered but the patient could not lie still enough long enough even with sedation to obtain an appropriate MRI RI it was felt that he would likely require general anesthesia for that purpose.  Because of that myelogram is been suggested and is now being performed.  A year ago he underwent myelography to evaluate the L5-S1 level that area showed that he had some lateral recess stenosis and he underwent an anterior lumbar interbody arthrodesis there.  The area appears to have healed well on plain x-rays but at this time of myelography we will also image the L5-S1 junction to his see and make sure that his arthrodesis is solidly healed.  Pre op Dx: Thoracic stenosis, lumbar radiculopathy Post op Dx: Thoracic stenosis, lumbar radiculopathy Procedure: Thoracic myelogram Surgeon: Vonda Harth Puncture level: L3-4 Fluid color: Clear colorless Injection: Iohexol 300, 9 mL Findings: Apparent thoracic stenosis above the arthrodesis at the junction.  Further evaluation with CT scanning.  L5-S1 appears solidly fused on plain x-rays and myelogram images.  Will further evaluate with CT scan of that local area.

## 2022-05-03 ENCOUNTER — Other Ambulatory Visit: Payer: Self-pay | Admitting: Neurological Surgery

## 2022-05-11 NOTE — Pre-Procedure Instructions (Signed)
Surgical Instructions    Your procedure is scheduled on Thursday, June 03, 2022 at 7:30 AM.  Report to [facility] Main Entrance "A" at 5:30 A.M., then check in with the Admitting office.  Call this number if you have problems the morning of surgery:  [phone]   If you have any questions prior to your surgery date call [phone]: Open Monday-Friday 8am-4pm  *If you experience any cold or flu symptoms such as cough, fever, chills, shortness of breath, etc. between now and your scheduled surgery, please notify us.*    Remember:  Do not eat after midnight the night before your surgery  You may drink clear liquids until 4:30 AM the morning of your surgery.   Clear liquids allowed are: Water, Non-Citrus Juices (without pulp), Carbonated Beverages, Clear Tea, Black Coffee Only (NO MILK, CREAM OR POWDERED CREAMER of any kind), and Gatorade.    Take these medicines the morning of surgery with A SIP OF WATER:  doxazosin (CARDURA)  esomeprazole (NEXIUM)  guaiFENesin (MUCINEX)  HYDROcodone-acetaminophen (NORCO)  lovastatin (MEVACOR)   IF NEEDED: dicyclomine (BENTYL)  Polyethyl Glycol-Propyl Glycol   As of today, STOP taking any Aspirin (unless otherwise instructed by your surgeon) Aleve, Naproxen, Ibuprofen, Motrin, Advil, meloxicam (MOBIC), Goody's, BC's, all herbal medications, fish oil, and all vitamins. This includes your diclofenac Sodium (VOLTAREN) 1 % GEL.                     Do NOT Smoke (Tobacco/Vaping) for 24 hours prior to your procedure.  If you use a CPAP at night, you may bring your mask/headgear for your overnight stay.   Contacts, glasses, piercing's, hearing aid's, dentures or partials may not be worn into surgery, please bring cases for these belongings.    For patients admitted to the hospital, discharge time will be determined by your treatment team.   Patients discharged the day of surgery will not be allowed to drive home, and someone needs to stay  with them for 24 hours.  SURGICAL WAITING ROOM VISITATION Patients having surgery or a procedure may have 2 support people in the waiting area. Visitors may stay in the waiting area during the procedure and switch out with other visitors if needed. Only 1 support person is allowed in the pre-op area with the patient AFTER the patient is prepped. This person cannot be switched out.  Children under the age of 53 must have an adult accompany them who is not the patient. If the patient needs to stay at the hospital during part of their recovery, the visitor guidelines for inpatient rooms apply.  Please refer to the [facility] website for the visitor guidelines for Inpatients (after your surgery is over and you are in a regular room).    Special instructions:   [facility]- Preparing For Surgery  Before surgery, you can play an important role. Because skin is not sterile, your skin needs to be as free of germs as possible. You can reduce the number of germs on your skin by washing with CHG (chlorahexidine gluconate) Soap before surgery.  CHG is an antiseptic cleaner which kills germs and bonds with the skin to continue killing germs even after washing.    Oral Hygiene is also important to reduce your risk of infection.  Remember - BRUSH YOUR TEETH THE MORNING OF SURGERY WITH YOUR REGULAR TOOTHPASTE  Please do not use if you have an allergy to CHG or antibacterial soaps. If your skin becomes reddened/irritated stop using  the CHG.  Do not shave (including legs and underarms) for at least 48 hours prior to first CHG shower. It is OK to shave your face.  Please follow these instructions carefully.   Shower the NIGHT BEFORE SURGERY and the MORNING OF SURGERY  If you chose to wash your hair, wash your hair first as usual with your normal shampoo.  After you shampoo, rinse your hair and body thoroughly to remove the shampoo.  Use CHG Soap as you would any other liquid soap. You can apply CHG  directly to the skin and wash gently with a scrungie or a clean washcloth.   Apply the CHG Soap to your body ONLY FROM THE NECK DOWN (neck, arms, chest, abdomen, legs, and back).  Do not use on open wounds or open sores. Avoid contact with your eyes, ears, mouth and genitals (private parts). Wash Face and genitals (private parts)  with your normal soap.   Wash thoroughly, paying special attention to the area where your surgery will be performed.  Thoroughly rinse your body with warm water from the neck down.  DO NOT shower/wash with your normal soap after using and rinsing off the CHG Soap.  Pat yourself dry with a CLEAN TOWEL.  Wear CLEAN PAJAMAS to bed the night before surgery  Place CLEAN SHEETS on your bed the night before your surgery  DO NOT SLEEP WITH PETS.   Day of Surgery: Take a shower with CHG soap. Do not wear lotions, powders, perfumes/colognes, or deodorant. Do not wear jewelry or makeup Do not shave 48 hours prior to surgery.  Men may shave face and neck. Do not wear nail polish, gel polish, artificial nails, or any other type of covering on natural nails (fingers and toes) If you have artificial nails or gel coating that need to be removed by a nail salon, please have this removed prior to surgery. Artificial nails or gel coating may interfere with anesthesia's ability to adequately monitor your vital signs. Wear Clean/Comfortable clothing the morning of surgery Do not bring valuables to the hospital.  [facility] is not responsible for any belongings or valuables. Remember to brush your teeth WITH YOUR REGULAR TOOTHPASTE.   Please read over the following fact sheets that you were given.  If you received a COVID test during your pre-op visit  it is requested that you wear a mask when out in public, stay away from anyone that may not be feeling well and notify your surgeon if you develop symptoms. If you have been in contact with anyone that has tested positive in the  last 10 days please notify you surgeon.

## 2022-05-12 ENCOUNTER — Other Ambulatory Visit: Payer: Self-pay

## 2022-05-12 ENCOUNTER — Encounter (HOSPITAL_COMMUNITY): Payer: Self-pay

## 2022-05-12 ENCOUNTER — Encounter (HOSPITAL_COMMUNITY)
Admission: RE | Admit: 2022-05-12 | Discharge: 2022-05-12 | Disposition: A | Discharge status: Home / Self Care | Payer: Medicare Other | Source: Ambulatory Visit | Attending: Neurological Surgery | Admitting: Neurological Surgery

## 2022-05-12 VITALS — BP 140/76 | HR 96 | Temp 97.5°F | Resp 18 | Ht 72.0 in | Wt 186.7 lb

## 2022-05-12 DIAGNOSIS — I10 Essential (primary) hypertension: Secondary | ICD-10-CM | POA: Insufficient documentation

## 2022-05-12 DIAGNOSIS — Z01812 Encounter for preprocedural laboratory examination: Secondary | ICD-10-CM | POA: Diagnosis not present

## 2022-05-12 DIAGNOSIS — Z01818 Encounter for other preprocedural examination: Secondary | ICD-10-CM

## 2022-05-12 HISTORY — DX: Pneumonia, unspecified organism: J18.9

## 2022-05-12 LAB — CBC
HCT: 44.9 % (ref 39.0–52.0)
Hemoglobin: 15.3 g/dL (ref 13.0–17.0)
MCH: 33.7 pg (ref 26.0–34.0)
MCHC: 34.1 g/dL (ref 30.0–36.0)
MCV: 98.9 fL (ref 80.0–100.0)
Platelets: 207 10*3/uL (ref 150–400)
RBC: 4.54 MIL/uL (ref 4.22–5.81)
RDW: 13.5 % (ref 11.5–15.5)
WBC: 8.6 10*3/uL (ref 4.0–10.5)
nRBC: 0 % (ref 0.0–0.2)

## 2022-05-12 LAB — BASIC METABOLIC PANEL
Anion gap: 8 (ref 5–15)
BUN: 19 mg/dL (ref 8–23)
CO2: 26 mmol/L (ref 22–32)
Calcium: 9.1 mg/dL (ref 8.9–10.3)
Chloride: 101 mmol/L (ref 98–111)
Creatinine, Ser: 1.43 mg/dL — ABNORMAL HIGH (ref 0.61–1.24)
GFR, Estimated: 50 mL/min — ABNORMAL LOW (ref >60)
Glucose, Bld: 112 mg/dL — ABNORMAL HIGH (ref 70–99)
Potassium: 4.2 mmol/L (ref 3.5–5.1)
Sodium: 135 mmol/L (ref 135–145)

## 2022-05-12 LAB — SURGICAL PCR SCREEN
MRSA, PCR: NEGATIVE
Staphylococcus aureus: POSITIVE — AB

## 2022-05-12 NOTE — Progress Notes (Addendum)
PCP -  Dr. Cher Nakai Cardiologist - Denies  PPM/ICD - Denies  Chest x-ray -  EKG -  07/14/2021 Stress Test - denies ECHO - denies Cardiac Cath - denies  Sleep Study -  Denies  Fasting Blood Sugar - non-diabetic  Last dose of GLP1 agonist-  non-diabetic GLP1 instructions: none  Blood Thinner Instructions: Denies Aspirin Instructions: Allergic  ERAS Protcol - yes PRE-SURGERY: clear fluids up until 3 hours prior to surgery  COVID TEST- Denies. Was on a cruise to [place] and [place] 08/17/9726 for 7 days.    Anesthesia review: Yes, review EKG. Patient reports seeing PCP 2 months ago and had EKG done. States will be seeing PCP 05/18/2022 for routine f/u.  EKG and office notes requested from PCP office. Patient denies being informed that he needs clearance for surgery.   Patient denies shortness of breath, fever, cough and chest pain at PAT appointment   All instructions explained to the patient, with a verbal understanding of the material. Patient agrees to go over the instructions while at home for a better understanding. Patient also instructed to self quarantine after being tested for COVID-19. The opportunity to ask questions was provided.

## 2022-05-13 ENCOUNTER — Encounter (HOSPITAL_COMMUNITY): Payer: Self-pay | Admitting: Vascular Surgery

## 2022-05-13 NOTE — Progress Notes (Addendum)
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  History includes never smoker, HTN, HLD, GERD, skin cancer, spinal surgery (C3-4 laminotomy/foraminotomy 02/03/09; L2-5 PLIF 09/11/10; C4-5 sitting discectomy 04/23/11; C3-7 ACDF 06/30/12; L1-2 discectomy, posterolateral arthrodesis 03/19/14, T12-L1 TLIF 06/26/20; L5-S1 ALIF 07/30/21), BPH (TURP 1999).  He was recently on a 7 days cruise to [place] [id number]. He reports he has regular primary care follow-up with next routine visit scheduled for 05/18/22. He denied chest pain and SOB. 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   He needs T&S on the day of surgery.    VS: BP (!) 140/76   Pulse 96   Temp (!) 36.4 C (Oral)   Resp 18   Ht 6' (1.829 m)   Wt 84.7 kg   SpO2 100%   BMI 25.32 kg/m   PROVIDERS: Cher Nakai, MD is PCP ([facility]). Addendum 06/02/22 10:00 AM: Since note originally signed, I requested last office note x2, but did not receive. Copy of last EKG was received.   LABS: Preoperative labs noted. Cr 1.42, previously 1.24 07/14/21. CBC normal.  (all labs ordered are listed, but only abnormal results are displayed)  Labs Reviewed  SURGICAL PCR SCREEN - Abnormal; Notable for the following components:      Result Value   Staphylococcus aureus POSITIVE (*)    All other components within normal limits  BASIC METABOLIC PANEL - Abnormal; Notable for the following components:   Glucose, Bld 112 (*)    Creatinine, Ser 1.43 (*)    GFR, Estimated 50 (*)    All other components within normal limits  CBC     IMAGES: CT T/L-spine with myelogram 04/28/22: IMPRESSION: Thoracic spine: 1. Prior  posterior fusion at the T10 and more caudal thoracic levels. Solid fusion across the disc spaces at T10-T11 and T11-T12. Questionable fusion across the disc space at T12-L1. 2. Adjacent level disease at T9-T10, slightly progressed from the prior thoracic myelogram of 07/01/2021. At this level, there is advanced disc degeneration. Progressive multifactorial mild-to-moderate spinal canal stenosis with contact upon the dorsal and ventral spinal cord (but without cord compression). Multifactorial bilateral neural foraminal narrowing also present at this level (moderate right, mild to moderate left). 3. Unchanged spinal canal narrowing (without spinal cord compression) at T1-T2, T10-T11 and T11-T12. Additional sites of unchanged foraminal stenosis, greatest on the right at T9-T10 and bilaterally at T10-T11 (moderate at these sites). 4. Incompletely assessed cervical spondylosis and postoperative changes. At C7-T1, there is multifactorial mild-to-moderate spinal canal stenosis, and severe bilateral bony neural foraminal narrowing.   Lumbar spine: 1. CT myelogram images acquired at the L5-S1 level. 2. At L5-S1, there is anterior interbody fusion hardware which is new from the prior myelogram of 07/01/2021. There is likely some bridging osseous fusion across the disc space centrally. However, there is lucency about the right-sided anterior approach screws at L5 and S1 suggesting loosening. Unchanged multifactorial left greater than right subarticular stenosis at this level, and mild relative narrowing of the central canal. Unchanged bilateral neural foraminal narrowing at this level (severe right, mild to moderate left). 3. Also new from the prior myelogram of 07/01/2021, there is  lucency surrounding the right-sided pedicle screw at L5, suggesting loosening.     EKG:  EKG 02/06/22 ([facility]): SR at 60 bpm. RBBB. LAFB.  - EKG appears similar to tracing from 07/14/21, 06/23/20 that also  showed RBBB/LAFB, bifascicular block.    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   History of kidney stones 03/2013   Lithotrispy   Hyperlipidemia    Hypertension    Leg pain    While Walking   Pneumonia    Sinus problem     Past Surgical History:  Procedure Laterality Date   ABDOMINAL EXPOSURE N/A 07/30/2021   Procedure: ABDOMINAL EXPOSURE;  Surgeon: Cherre Robins, MD;  Location: [place];  Service: Vascular;  Laterality: N/A;   ANTERIOR CERVICAL DECOMPRESSION/DISCECTOMY FUSION 4 LEVELS N/A 06/30/2012   Procedure: ANTERIOR CERVICAL DECOMPRESSION/DISCECTOMY FUSION 4 LEVELS;  Surgeon: Kristeen Miss, MD;  Location: [place] [facility];  Service: Neurosurgery;  Laterality: N/A;  C3-4 C4-5 C5-6 C6-7 Anterior cervical decompression/diskectomy/fusion   ANTERIOR LUMBAR FUSION N/A 07/30/2021   Procedure: LUMBAR FIVE-SACRAL ONE ANTERIOR LUMBAR INTERBODY FUSION;  Surgeon: Kristeen Miss, MD;  Location: [place];  Service: Neurosurgery;  Laterality: N/A;   APPLICATION OF ROBOTIC ASSISTANCE FOR SPINAL PROCEDURE N/A 06/26/2020   Procedure: APPLICATION OF ROBOTIC ASSISTANCE FOR SPINAL PROCEDURE;  Surgeon: Kristeen Miss, MD;  Location: [place];  Service: Neurosurgery;  Laterality: N/A;   BACK SURGERY  08/2010   lumbar fusion   CATARACT EXTRACTION, BILATERAL     CHOLECYSTECTOMY  1990's   COLONOSCOPY  02/04/2015   NECK SURGERY     x2   sinus surgeries     x2   SKIN CANCER EXCISION     arm chest and head basal cell   TRANSURETHRAL RESECTION OF PROSTATE  1999    MEDICATIONS:  Calcium Carb-Cholecalciferol (CALCIUM 600/VITAMIN D PO)   Coenzyme Q10 (COQ10) 100 MG CAPS   CRANBERRY PO   diclofenac Sodium (VOLTAREN) 1 % GEL   dicyclomine (BENTYL) 10 MG capsule   doxazosin (CARDURA) 4 MG tablet   Echinacea 500 MG CAPS   esomeprazole  (NEXIUM) 20 MG capsule   glucosamine-chondroitin 500-400 MG tablet   guaiFENesin (MUCINEX) 600 MG 12 hr tablet   HYDROcodone-acetaminophen (NORCO) 10-325 MG tablet   HYDROcodone-acetaminophen (NORCO) 7.5-325 MG tablet   lisinopril (PRINIVIL,ZESTRIL) 20 MG tablet   lovastatin (MEVACOR) 40 MG tablet   Mag Aspart-Potassium Aspart (RA POTASSIUM/MAGNESIUM PO)   meloxicam (MOBIC) 7.5 MG tablet   Menthol-Camphor (TIGER BALM ARTHRITIS RUB) 11-11 % CREA   methocarbamol (ROBAXIN) 500 MG tablet   niacin 500 MG tablet   Omega-3 Fatty Acids (SALMON OIL PO)   Polyethyl Glycol-Propyl Glycol 0.4-0.3 % SOLN   Probiotic Product (PRO-BIOTIC BLEND PO)   Simethicone (GAS-X PO)   testosterone cypionate (DEPOTESTOSTERONE CYPIONATE) 200 MG/ML injection   Turmeric 500 MG CAPS   zinc gluconate 50 MG tablet   zolpidem (AMBIEN) 10 MG tablet   No current facility-administered medications for this encounter.    Myra Gianotti, PA-C Surgical Short Stay/Anesthesiology [place] Phone [phone] [facility] Phone [phone] 05/14/2022 4:13 PM

## 2022-05-14 ENCOUNTER — Encounter (HOSPITAL_COMMUNITY): Payer: Self-pay | Admitting: Vascular Surgery

## 2022-05-14 NOTE — Anesthesia Preprocedure Evaluation (Deleted)
Anesthesia Evaluation    Airway        Dental   Pulmonary           Cardiovascular hypertension,      Neuro/Psych    GI/Hepatic   Endo/Other    Renal/GU      Musculoskeletal   Abdominal   Peds  Hematology   Anesthesia Other Findings   Reproductive/Obstetrics                             Anesthesia Physical Anesthesia Plan  ASA:   Anesthesia Plan:    Post-op Pain Management:    Induction:   PONV Risk Score and Plan:   Airway Management Planned:   Additional Equipment:   Intra-op Plan:   Post-operative Plan:   Informed Consent:   Plan Discussed with:   Anesthesia Plan Comments: (PAT note written 05/14/2022 by Myra Gianotti, PA-C.  )       Anesthesia Quick Evaluation

## 2022-06-02 ENCOUNTER — Encounter (HOSPITAL_COMMUNITY): Payer: Self-pay | Admitting: Vascular Surgery

## 2022-06-02 NOTE — Addendum Note (Signed)
Encounter addended by: Jacinta Shoe, PA-C on: 06/02/2022 10:02 AM  Actions taken: Clinical Note Signed

## 2022-06-02 NOTE — Anesthesia Preprocedure Evaluation (Addendum)
Anesthesia Evaluation  Patient identified by MRN, date of birth, ID band Patient awake    Reviewed: Allergy & Precautions, H&P , NPO status , Patient's Chart, lab work & pertinent test results  Airway Mallampati: II  TM Distance: >3 FB Neck ROM: Full    Dental no notable dental hx. (+) Teeth Intact, Dental Advisory Given   Pulmonary neg pulmonary ROS   Pulmonary exam normal breath sounds clear to auscultation       Cardiovascular Exercise Tolerance: Good hypertension, Pt. on medications  Rhythm:Regular Rate:Normal     Neuro/Psych negative neurological ROS  negative psych ROS   GI/Hepatic Neg liver ROS,GERD  Medicated,,  Endo/Other  negative endocrine ROS    Renal/GU negative Renal ROS  negative genitourinary   Musculoskeletal  (+) Arthritis ,    Abdominal   Peds  Hematology negative hematology ROS (+)   Anesthesia Other Findings   Reproductive/Obstetrics negative OB ROS                             Anesthesia Physical Anesthesia Plan  ASA: 2  Anesthesia Plan: General   Post-op Pain Management: Tylenol PO (pre-op)*   Induction: Intravenous  PONV Risk Score and Plan: 3 and Ondansetron, Dexamethasone and Treatment may vary due to age or medical condition  Airway Management Planned: Oral ETT  Additional Equipment:   Intra-op Plan:   Post-operative Plan: Extubation in OR  Informed Consent: I have reviewed the patients History and Physical, chart, labs and discussed the procedure including the risks, benefits and alternatives for the proposed anesthesia with the patient or authorized representative who has indicated his/her understanding and acceptance.     Dental advisory given  Plan Discussed with: CRNA  Anesthesia Plan Comments: (PAT note written by Myra Gianotti, PA-C.  )       Anesthesia Quick Evaluation

## 2022-06-03 ENCOUNTER — Ambulatory Visit (HOSPITAL_COMMUNITY): Payer: Medicare Other | Admitting: Vascular Surgery

## 2022-06-03 ENCOUNTER — Encounter (HOSPITAL_COMMUNITY)
Admission: RE | Disposition: A | Discharge status: Home / Self Care | Payer: Self-pay | Source: Home / Self Care | Attending: Neurological Surgery

## 2022-06-03 ENCOUNTER — Inpatient Hospital Stay (HOSPITAL_COMMUNITY)
Admission: RE | Admit: 2022-06-03 | Discharge: 2022-06-04 | DRG: 455 | Disposition: A | Discharge status: Home / Self Care | Payer: Medicare Other | Attending: Neurological Surgery | Admitting: Neurological Surgery

## 2022-06-03 ENCOUNTER — Other Ambulatory Visit: Payer: Self-pay

## 2022-06-03 ENCOUNTER — Encounter (HOSPITAL_COMMUNITY): Payer: Self-pay | Admitting: Neurological Surgery

## 2022-06-03 ENCOUNTER — Ambulatory Visit (HOSPITAL_COMMUNITY): Payer: Medicare Other

## 2022-06-03 DIAGNOSIS — I10 Essential (primary) hypertension: Secondary | ICD-10-CM | POA: Diagnosis present

## 2022-06-03 DIAGNOSIS — Z823 Family history of stroke: Secondary | ICD-10-CM | POA: Diagnosis not present

## 2022-06-03 DIAGNOSIS — Z85828 Personal history of other malignant neoplasm of skin: Secondary | ICD-10-CM | POA: Diagnosis not present

## 2022-06-03 DIAGNOSIS — M532X4 Spinal instabilities, thoracic region: Secondary | ICD-10-CM | POA: Diagnosis present

## 2022-06-03 DIAGNOSIS — Z886 Allergy status to analgesic agent status: Secondary | ICD-10-CM | POA: Diagnosis not present

## 2022-06-03 DIAGNOSIS — G992 Myelopathy in diseases classified elsewhere: Principal | ICD-10-CM | POA: Diagnosis present

## 2022-06-03 DIAGNOSIS — E785 Hyperlipidemia, unspecified: Secondary | ICD-10-CM | POA: Diagnosis present

## 2022-06-03 DIAGNOSIS — K219 Gastro-esophageal reflux disease without esophagitis: Secondary | ICD-10-CM | POA: Diagnosis present

## 2022-06-03 DIAGNOSIS — Z8249 Family history of ischemic heart disease and other diseases of the circulatory system: Secondary | ICD-10-CM | POA: Diagnosis not present

## 2022-06-03 DIAGNOSIS — Z808 Family history of malignant neoplasm of other organs or systems: Secondary | ICD-10-CM

## 2022-06-03 DIAGNOSIS — M4714 Other spondylosis with myelopathy, thoracic region: Secondary | ICD-10-CM

## 2022-06-03 DIAGNOSIS — M4804 Spinal stenosis, thoracic region: Principal | ICD-10-CM | POA: Diagnosis present

## 2022-06-03 DIAGNOSIS — M4314 Spondylolisthesis, thoracic region: Secondary | ICD-10-CM | POA: Diagnosis not present

## 2022-06-03 DIAGNOSIS — Z791 Long term (current) use of non-steroidal anti-inflammatories (NSAID): Secondary | ICD-10-CM

## 2022-06-03 DIAGNOSIS — Z981 Arthrodesis status: Secondary | ICD-10-CM

## 2022-06-03 DIAGNOSIS — Z79899 Other long term (current) drug therapy: Secondary | ICD-10-CM | POA: Diagnosis not present

## 2022-06-03 DIAGNOSIS — Z825 Family history of asthma and other chronic lower respiratory diseases: Secondary | ICD-10-CM

## 2022-06-03 HISTORY — PX: TRANSFORAMINAL LUMBAR INTERBODY FUSION (TLIF) WITH PEDICLE SCREW FIXATION 1 LEVEL: SHX6141

## 2022-06-03 LAB — TYPE AND SCREEN
ABO/RH(D): A POS
Antibody Screen: NEGATIVE

## 2022-06-03 SURGERY — TRANSFORAMINAL LUMBAR INTERBODY FUSION (TLIF) WITH PEDICLE SCREW FIXATION 1 LEVEL
Anesthesia: General | Site: Back

## 2022-06-03 MED ORDER — POLYETHYL GLYCOL-PROPYL GLYCOL 0.4-0.3 % OP SOLN: 1.0000 [drp] | Freq: Four times a day (QID) | OPHTHALMIC | Status: DC | PRN

## 2022-06-03 MED ORDER — LACTATED RINGERS IV SOLN: INTRAVENOUS | Status: DC

## 2022-06-03 MED ORDER — METHOCARBAMOL 500 MG PO TABS
500.0000 mg | ORAL_TABLET | Freq: Four times a day (QID) | ORAL | Status: DC | PRN
  Administered 2022-06-03 – 2022-06-04 (×2): 500 mg via ORAL

## 2022-06-03 MED ORDER — PHENYLEPHRINE HCL-NACL 20-0.9 MG/250ML-% IV SOLN
INTRAVENOUS | Status: DC | PRN
  Administered 2022-06-03: 30 ug/min via INTRAVENOUS
  Administered 2022-06-03: 50 ug/min via INTRAVENOUS

## 2022-06-03 MED ORDER — LIDOCAINE-EPINEPHRINE 1 %-1:100000 IJ SOLN
INTRAMUSCULAR | Status: DC | PRN
  Administered 2022-06-03: 10 mL

## 2022-06-03 MED ORDER — BUPIVACAINE HCL (PF) 0.5 % IJ SOLN
INTRAMUSCULAR | Status: AC
  Filled 2022-06-03: qty 30

## 2022-06-03 MED ORDER — DICYCLOMINE HCL 10 MG PO CAPS: 10.0000 mg | ORAL_CAPSULE | Freq: Two times a day (BID) | ORAL | Status: DC | PRN

## 2022-06-03 MED ORDER — BISACODYL 10 MG RE SUPP: 10.0000 mg | Freq: Every day | RECTAL | Status: DC | PRN

## 2022-06-03 MED ORDER — FENTANYL CITRATE (PF) 250 MCG/5ML IJ SOLN
INTRAMUSCULAR | Status: AC
  Filled 2022-06-03: qty 5

## 2022-06-03 MED ORDER — ROCURONIUM BROMIDE 10 MG/ML (PF) SYRINGE
PREFILLED_SYRINGE | INTRAVENOUS | Status: AC
  Filled 2022-06-03: qty 10

## 2022-06-03 MED ORDER — CHLORHEXIDINE GLUCONATE CLOTH 2 % EX PADS: 6.0000 | MEDICATED_PAD | Freq: Once | CUTANEOUS | Status: DC

## 2022-06-03 MED ORDER — BACITRACIN ZINC 500 UNIT/GM EX OINT
TOPICAL_OINTMENT | CUTANEOUS | Status: AC
  Filled 2022-06-03: qty 28.35

## 2022-06-03 MED ORDER — LIDOCAINE 2% (20 MG/ML) 5 ML SYRINGE
INTRAMUSCULAR | Status: DC | PRN
  Administered 2022-06-03: 100 mg via INTRAVENOUS

## 2022-06-03 MED ORDER — THROMBIN 5000 UNITS EX SOLR
CUTANEOUS | Status: AC
  Filled 2022-06-03: qty 5000

## 2022-06-03 MED ORDER — EPHEDRINE 5 MG/ML INJ
INTRAVENOUS | Status: AC
  Filled 2022-06-03: qty 5

## 2022-06-03 MED ORDER — DOXAZOSIN MESYLATE 4 MG PO TABS
4.0000 mg | ORAL_TABLET | Freq: Every day | ORAL | Status: DC
  Administered 2022-06-03: 4 mg via ORAL
  Filled 2022-06-03 (×2): qty 1

## 2022-06-03 MED ORDER — METHOCARBAMOL 500 MG PO TABS
500.0000 mg | ORAL_TABLET | Freq: Four times a day (QID) | ORAL | Status: DC | PRN
  Filled 2022-06-03 (×3): qty 1

## 2022-06-03 MED ORDER — LACTATED RINGERS IV SOLN: INTRAVENOUS | Status: DC | PRN

## 2022-06-03 MED ORDER — GUAIFENESIN ER 600 MG PO TB12
600.0000 mg | ORAL_TABLET | Freq: Two times a day (BID) | ORAL | Status: DC
  Administered 2022-06-03: 600 mg via ORAL
  Filled 2022-06-03 (×3): qty 1

## 2022-06-03 MED ORDER — PHENYLEPHRINE 80 MCG/ML (10ML) SYRINGE FOR IV PUSH (FOR BLOOD PRESSURE SUPPORT)
PREFILLED_SYRINGE | INTRAVENOUS | Status: DC | PRN
  Administered 2022-06-03 (×2): 80 ug via INTRAVENOUS
  Administered 2022-06-03: 160 ug via INTRAVENOUS

## 2022-06-03 MED ORDER — ONDANSETRON HCL 4 MG/2ML IJ SOLN: 4.0000 mg | Freq: Four times a day (QID) | INTRAMUSCULAR | Status: DC | PRN

## 2022-06-03 MED ORDER — CEFAZOLIN SODIUM 1 G IJ SOLR
INTRAMUSCULAR | Status: AC
  Filled 2022-06-03: qty 20

## 2022-06-03 MED ORDER — LIDOCAINE-EPINEPHRINE 1 %-1:100000 IJ SOLN
INTRAMUSCULAR | Status: AC
  Filled 2022-06-03: qty 1

## 2022-06-03 MED ORDER — PROPOFOL 10 MG/ML IV BOLUS
INTRAVENOUS | Status: DC | PRN
  Administered 2022-06-03: 120 mg via INTRAVENOUS

## 2022-06-03 MED ORDER — DEXAMETHASONE SODIUM PHOSPHATE 10 MG/ML IJ SOLN
INTRAMUSCULAR | Status: AC
  Filled 2022-06-03: qty 1

## 2022-06-03 MED ORDER — SODIUM CHLORIDE 0.9 % IV SOLN
250.0000 mL | INTRAVENOUS | Status: DC
  Administered 2022-06-03: 250 mL via INTRAVENOUS

## 2022-06-03 MED ORDER — METHOCARBAMOL 1000 MG/10ML IJ SOLN: 500.0000 mg | Freq: Four times a day (QID) | INTRAVENOUS | Status: DC | PRN

## 2022-06-03 MED ORDER — CEFAZOLIN SODIUM-DEXTROSE 2-4 GM/100ML-% IV SOLN
2.0000 g | Freq: Three times a day (TID) | INTRAVENOUS | Status: AC
  Administered 2022-06-03 – 2022-06-04 (×2): 2 g via INTRAVENOUS
  Filled 2022-06-03 (×2): qty 100

## 2022-06-03 MED ORDER — HYDROMORPHONE HCL 1 MG/ML IJ SOLN
INTRAMUSCULAR | Status: AC
  Filled 2022-06-03: qty 1

## 2022-06-03 MED ORDER — LISINOPRIL 20 MG PO TABS
20.0000 mg | ORAL_TABLET | Freq: Every evening | ORAL | Status: DC
  Filled 2022-06-03: qty 1

## 2022-06-03 MED ORDER — ACETAMINOPHEN 325 MG PO TABS: 650.0000 mg | ORAL_TABLET | ORAL | Status: DC | PRN

## 2022-06-03 MED ORDER — PROPOFOL 10 MG/ML IV BOLUS
INTRAVENOUS | Status: AC
  Filled 2022-06-03: qty 20

## 2022-06-03 MED ORDER — DEXAMETHASONE SODIUM PHOSPHATE 10 MG/ML IJ SOLN
INTRAMUSCULAR | Status: DC | PRN
  Administered 2022-06-03: 10 mg via INTRAVENOUS

## 2022-06-03 MED ORDER — SODIUM CHLORIDE 0.9% FLUSH
3.0000 mL | Freq: Two times a day (BID) | INTRAVENOUS | Status: DC
  Administered 2022-06-03 (×2): 3 mL via INTRAVENOUS

## 2022-06-03 MED ORDER — CHLORHEXIDINE GLUCONATE 0.12 % MT SOLN
15.0000 mL | Freq: Once | OROMUCOSAL | Status: AC
  Administered 2022-06-03: 15 mL via OROMUCOSAL
  Filled 2022-06-03: qty 15

## 2022-06-03 MED ORDER — ONDANSETRON HCL 4 MG PO TABS: 4.0000 mg | ORAL_TABLET | Freq: Four times a day (QID) | ORAL | Status: DC | PRN

## 2022-06-03 MED ORDER — SODIUM CHLORIDE 0.9% FLUSH: 3.0000 mL | INTRAVENOUS | Status: DC | PRN

## 2022-06-03 MED ORDER — HYDROMORPHONE HCL 1 MG/ML IJ SOLN
0.2500 mg | INTRAMUSCULAR | Status: DC | PRN
  Administered 2022-06-03 (×2): 0.5 mg via INTRAVENOUS

## 2022-06-03 MED ORDER — PHENOL 1.4 % MT LIQD: 1.0000 | OROMUCOSAL | Status: DC | PRN

## 2022-06-03 MED ORDER — ACETAMINOPHEN 500 MG PO TABS
1000.0000 mg | ORAL_TABLET | Freq: Once | ORAL | Status: DC
  Filled 2022-06-03: qty 2

## 2022-06-03 MED ORDER — SIMETHICONE 80 MG PO CHEW: 80.0000 mg | CHEWABLE_TABLET | Freq: Three times a day (TID) | ORAL | Status: DC | PRN

## 2022-06-03 MED ORDER — LIDOCAINE 2% (20 MG/ML) 5 ML SYRINGE
INTRAMUSCULAR | Status: AC
  Filled 2022-06-03: qty 5

## 2022-06-03 MED ORDER — POLYVINYL ALCOHOL 1.4 % OP SOLN: 1.0000 [drp] | OPHTHALMIC | Status: DC | PRN

## 2022-06-03 MED ORDER — BUPIVACAINE HCL (PF) 0.5 % IJ SOLN
INTRAMUSCULAR | Status: DC | PRN
  Administered 2022-06-03: 20 mL

## 2022-06-03 MED ORDER — SENNA 8.6 MG PO TABS
1.0000 | ORAL_TABLET | Freq: Two times a day (BID) | ORAL | Status: DC
  Administered 2022-06-03 – 2022-06-04 (×2): 8.6 mg via ORAL
  Filled 2022-06-03 (×3): qty 1

## 2022-06-03 MED ORDER — ZOLPIDEM TARTRATE 5 MG PO TABS
10.0000 mg | ORAL_TABLET | Freq: Every day | ORAL | Status: DC | PRN
  Administered 2022-06-03: 10 mg via ORAL
  Filled 2022-06-03: qty 2

## 2022-06-03 MED ORDER — ONDANSETRON HCL 4 MG/2ML IJ SOLN
INTRAMUSCULAR | Status: DC | PRN
  Administered 2022-06-03: 4 mg via INTRAVENOUS

## 2022-06-03 MED ORDER — THROMBIN 20000 UNITS EX SOLR
CUTANEOUS | Status: AC
  Filled 2022-06-03: qty 20000

## 2022-06-03 MED ORDER — ACETAMINOPHEN 500 MG PO TABS
500.0000 mg | ORAL_TABLET | Freq: Once | ORAL | Status: AC
  Administered 2022-06-03: 500 mg via ORAL

## 2022-06-03 MED ORDER — EPHEDRINE SULFATE-NACL 50-0.9 MG/10ML-% IV SOSY
PREFILLED_SYRINGE | INTRAVENOUS | Status: DC | PRN
  Administered 2022-06-03 (×2): 2.5 mg via INTRAVENOUS

## 2022-06-03 MED ORDER — THROMBIN 5000 UNITS EX SOLR: OROMUCOSAL | Status: DC | PRN

## 2022-06-03 MED ORDER — HYDROCODONE-ACETAMINOPHEN 7.5-325 MG PO TABS: 1.0000 | ORAL_TABLET | Freq: Four times a day (QID) | ORAL | Status: DC | PRN

## 2022-06-03 MED ORDER — ROCURONIUM BROMIDE 10 MG/ML (PF) SYRINGE
PREFILLED_SYRINGE | INTRAVENOUS | Status: DC | PRN
  Administered 2022-06-03 (×2): 20 mg via INTRAVENOUS
  Administered 2022-06-03: 50 mg via INTRAVENOUS
  Administered 2022-06-03: 30 mg via INTRAVENOUS

## 2022-06-03 MED ORDER — HYDROMORPHONE HCL 1 MG/ML IJ SOLN: 1.0000 mg | INTRAMUSCULAR | Status: DC | PRN

## 2022-06-03 MED ORDER — ACETAMINOPHEN 650 MG RE SUPP: 650.0000 mg | RECTAL | Status: DC | PRN

## 2022-06-03 MED ORDER — DOCUSATE SODIUM 100 MG PO CAPS
100.0000 mg | ORAL_CAPSULE | Freq: Two times a day (BID) | ORAL | Status: DC
  Administered 2022-06-03 – 2022-06-04 (×2): 100 mg via ORAL
  Filled 2022-06-03 (×2): qty 1

## 2022-06-03 MED ORDER — FENTANYL CITRATE (PF) 250 MCG/5ML IJ SOLN
INTRAMUSCULAR | Status: DC | PRN
  Administered 2022-06-03 (×6): 50 ug via INTRAVENOUS

## 2022-06-03 MED ORDER — POLYETHYLENE GLYCOL 3350 17 G PO PACK: 17.0000 g | PACK | Freq: Every day | ORAL | Status: DC | PRN

## 2022-06-03 MED ORDER — SUGAMMADEX SODIUM 200 MG/2ML IV SOLN
INTRAVENOUS | Status: DC | PRN
  Administered 2022-06-03: 200 mg via INTRAVENOUS

## 2022-06-03 MED ORDER — FLEET ENEMA 7-19 GM/118ML RE ENEM: 1.0000 | ENEMA | Freq: Once | RECTAL | Status: DC | PRN

## 2022-06-03 MED ORDER — PANTOPRAZOLE SODIUM 40 MG PO TBEC
40.0000 mg | DELAYED_RELEASE_TABLET | Freq: Every day | ORAL | Status: DC
  Administered 2022-06-03: 40 mg via ORAL
  Filled 2022-06-03: qty 1

## 2022-06-03 MED ORDER — 0.9 % SODIUM CHLORIDE (POUR BTL) OPTIME
TOPICAL | Status: DC | PRN
  Administered 2022-06-03: 1000 mL

## 2022-06-03 MED ORDER — ALBUMIN HUMAN 5 % IV SOLN: INTRAVENOUS | Status: DC | PRN

## 2022-06-03 MED ORDER — ORAL CARE MOUTH RINSE: 15.0000 mL | Freq: Once | OROMUCOSAL | Status: AC

## 2022-06-03 MED ORDER — PRAVASTATIN SODIUM 40 MG PO TABS
40.0000 mg | ORAL_TABLET | Freq: Every day | ORAL | Status: DC
  Administered 2022-06-03: 40 mg via ORAL
  Filled 2022-06-03: qty 1

## 2022-06-03 MED ORDER — KETAMINE HCL 50 MG/5ML IJ SOSY
PREFILLED_SYRINGE | INTRAMUSCULAR | Status: AC
  Filled 2022-06-03: qty 5

## 2022-06-03 MED ORDER — CEFAZOLIN SODIUM-DEXTROSE 2-4 GM/100ML-% IV SOLN
2.0000 g | INTRAVENOUS | Status: AC
  Administered 2022-06-03 (×2): 2 g via INTRAVENOUS
  Filled 2022-06-03: qty 100

## 2022-06-03 MED ORDER — HYDROCODONE-ACETAMINOPHEN 10-325 MG PO TABS
1.0000 | ORAL_TABLET | ORAL | Status: DC | PRN
  Administered 2022-06-03 – 2022-06-04 (×4): 1 via ORAL
  Filled 2022-06-03 (×4): qty 1

## 2022-06-03 MED ORDER — ONDANSETRON HCL 4 MG/2ML IJ SOLN
INTRAMUSCULAR | Status: AC
  Filled 2022-06-03: qty 2

## 2022-06-03 MED ORDER — MENTHOL 3 MG MT LOZG: 1.0000 | LOZENGE | OROMUCOSAL | Status: DC | PRN

## 2022-06-03 MED ORDER — PHENYLEPHRINE 80 MCG/ML (10ML) SYRINGE FOR IV PUSH (FOR BLOOD PRESSURE SUPPORT)
PREFILLED_SYRINGE | INTRAVENOUS | Status: AC
  Filled 2022-06-03: qty 10

## 2022-06-03 SURGICAL SUPPLY — 73 items
BAG COUNTER SPONGE SURGICOUNT (BAG) ×1 IMPLANT
BASKET BONE COLLECTION (BASKET) ×1 IMPLANT
BLADE CLIPPER SURG (BLADE) IMPLANT
BONE CANC CHIPS 20CC PCAN1/4 (Bone Implant) ×1 IMPLANT
BUR MATCHSTICK NEURO 3.0 LAGG (BURR) ×1 IMPLANT
CAGE SABLE 10X22 6-12 0D (Cage) IMPLANT
CANISTER SUCT 3000ML PPV (MISCELLANEOUS) ×1 IMPLANT
CEMENT KYPHON CX01A KIT/MIXER (Cement) IMPLANT
CHIPS CANC BONE 20CC PCAN1/4 (Bone Implant) ×1 IMPLANT
CNTNR URN SCR LID CUP LEK RST (MISCELLANEOUS) ×1 IMPLANT
CONN RELINE 5-6/5-6M 2H INLINE (Connector) ×2 IMPLANT
CONNECTOR RELN 5-6/5-6M 2 HL (Connector) IMPLANT
CONT SPEC 4OZ STRL OR WHT (MISCELLANEOUS) ×1
COVER BACK TABLE 60X90IN (DRAPES) ×1 IMPLANT
DERMABOND ADVANCED .7 DNX12 (GAUZE/BANDAGES/DRESSINGS) ×1 IMPLANT
DEVICE DISSECT PLASMABLAD 3.0S (MISCELLANEOUS) ×1 IMPLANT
DRAPE C-ARM 42X72 X-RAY (DRAPES) ×2 IMPLANT
DRAPE HALF SHEET 40X57 (DRAPES) IMPLANT
DRAPE LAPAROTOMY 100X72X124 (DRAPES) ×1 IMPLANT
DURAPREP 26ML APPLICATOR (WOUND CARE) ×1 IMPLANT
DURASEAL APPLICATOR TIP (TIP) IMPLANT
DURASEAL SPINE SEALANT 3ML (MISCELLANEOUS) IMPLANT
ELECT REM PT RETURN 9FT ADLT (ELECTROSURGICAL) ×1
ELECTRODE REM PT RTRN 9FT ADLT (ELECTROSURGICAL) ×1 IMPLANT
GAUZE 4X4 16PLY ~~LOC~~+RFID DBL (SPONGE) IMPLANT
GAUZE SPONGE 4X4 12PLY STRL (GAUZE/BANDAGES/DRESSINGS) ×1 IMPLANT
GAUZE SPONGE 4X4 12PLY STRL LF (GAUZE/BANDAGES/DRESSINGS) IMPLANT
GLOVE BIOGEL PI IND STRL 8.5 (GLOVE) ×2 IMPLANT
GLOVE ECLIPSE 8.5 STRL (GLOVE) ×2 IMPLANT
GOWN STRL REUS W/ TWL LRG LVL3 (GOWN DISPOSABLE) IMPLANT
GOWN STRL REUS W/ TWL XL LVL3 (GOWN DISPOSABLE) IMPLANT
GOWN STRL REUS W/TWL 2XL LVL3 (GOWN DISPOSABLE) ×2 IMPLANT
GOWN STRL REUS W/TWL LRG LVL3 (GOWN DISPOSABLE)
GOWN STRL REUS W/TWL XL LVL3 (GOWN DISPOSABLE)
GRAFT BNE CANC CHIPS 1-8 20CC (Bone Implant) IMPLANT
GRAFT BONE PROTEIOS LRG 5CC (Orthopedic Implant) IMPLANT
HEMOSTAT POWDER KIT SURGIFOAM (HEMOSTASIS) IMPLANT
KIT BASIN OR (CUSTOM PROCEDURE TRAY) ×1 IMPLANT
KIT GRAFTMAG DEL NEURO DISP (NEUROSURGERY SUPPLIES) ×1 IMPLANT
KIT TURNOVER KIT B (KITS) ×1 IMPLANT
MILL BONE PREP (MISCELLANEOUS) ×1 IMPLANT
NDL RELINE-OR FENS 16 (NEEDLE) IMPLANT
NEEDLE HYPO 22GX1.5 SAFETY (NEEDLE) ×1 IMPLANT
NEEDLE RELINE-OR FENS 16 (NEEDLE) ×2 IMPLANT
NS IRRIG 1000ML POUR BTL (IV SOLUTION) ×1 IMPLANT
PACK LAMINECTOMY NEURO (CUSTOM PROCEDURE TRAY) ×1 IMPLANT
PAD ARMBOARD 7.5X6 YLW CONV (MISCELLANEOUS) ×3 IMPLANT
PATTIES SURGICAL .5 X1 (DISPOSABLE) ×1 IMPLANT
PLASMABLADE 3.0S (MISCELLANEOUS) ×1
PUSHER RELINE FENS 36 OR (MISCELLANEOUS) IMPLANT
PUTTY DBM INSTAFILL CART 5CC (Putty) IMPLANT
RASP 3.0MM (RASP) IMPLANT
ROD ARM15T 40MM (Rod) IMPLANT
SCREW LOCK RELINE 5.5 TULIP (Screw) IMPLANT
SCREW LOCK RELINE CLOSED TULIP (Screw) IMPLANT
SCREW PA TFNA RELINE 5.5X45 (Screw) IMPLANT
SCREW RELINE-O POLY 5.5X45MM (Screw) IMPLANT
SPIKE FLUID TRANSFER (MISCELLANEOUS) ×1 IMPLANT
SPONGE SURGIFOAM ABS GEL 100 (HEMOSTASIS) ×1 IMPLANT
SPONGE T-LAP 4X18 ~~LOC~~+RFID (SPONGE) IMPLANT
SUT PROLENE 6 0 BV (SUTURE) IMPLANT
SUT VIC AB 1 CT1 18XBRD ANBCTR (SUTURE) ×1 IMPLANT
SUT VIC AB 1 CT1 8-18 (SUTURE) ×1
SUT VIC AB 2-0 CP2 18 (SUTURE) ×1 IMPLANT
SUT VIC AB 3-0 SH 8-18 (SUTURE) ×1 IMPLANT
SUT VIC AB 4-0 RB1 18 (SUTURE) ×1 IMPLANT
SYR 3ML LL SCALE MARK (SYRINGE) ×4 IMPLANT
TAPE CLOTH SURG 4X10 WHT LF (GAUZE/BANDAGES/DRESSINGS) IMPLANT
TIP CONICAL INSTAFILL (ORTHOPEDIC DISPOSABLE SUPPLIES) IMPLANT
TOWEL GREEN STERILE (TOWEL DISPOSABLE) ×1 IMPLANT
TOWEL GREEN STERILE FF (TOWEL DISPOSABLE) ×1 IMPLANT
TRAY FOLEY MTR SLVR 16FR STAT (SET/KITS/TRAYS/PACK) ×1 IMPLANT
WATER STERILE IRR 1000ML POUR (IV SOLUTION) ×1 IMPLANT

## 2022-06-03 NOTE — Transfer of Care (Signed)
Immediate Anesthesia Transfer of Care Note  Patient: Daniel Wagner  Procedure(s) Performed: T8-9 TLIF (Back)  Patient Location: PACU  Anesthesia Type:General  Level of Consciousness: awake and patient cooperative  Airway & Oxygen Therapy: Patient Spontanous Breathing and Patient connected to face mask oxygen  Post-op Assessment: Report given to RN, Post -op Vital signs reviewed and stable, and Patient moving all extremities X 4  Post vital signs: Reviewed and stable  Last Vitals:  Vitals Value Taken Time  BP 139/73 06/03/22 1251  Temp    Pulse 80 06/03/22 1253  Resp 14 06/03/22 1253  SpO2 96 % 06/03/22 1253  Vitals shown include unvalidated device data.  Last Pain:  Vitals:   06/03/22 0644  TempSrc:   PainSc: 5          Complications: No notable events documented.

## 2022-06-03 NOTE — Anesthesia Postprocedure Evaluation (Signed)
Anesthesia Post Note  Patient: Daniel Wagner  Procedure(s) Performed: T8-9 TLIF (Back)     Patient location during evaluation: PACU Anesthesia Type: General Level of consciousness: awake and alert Pain management: pain level controlled Vital Signs Assessment: post-procedure vital signs reviewed and stable Respiratory status: spontaneous breathing, nonlabored ventilation and respiratory function stable Cardiovascular status: blood pressure returned to baseline and stable Postop Assessment: no apparent nausea or vomiting Anesthetic complications: no  No notable events documented.  Last Vitals:  Vitals:   06/03/22 1330 06/03/22 1345  BP: 129/80 115/78  Pulse: 79 81  Resp: 10 18  Temp:    SpO2: 94% 95%    Last Pain:  Vitals:   06/03/22 1345  TempSrc:   PainSc: 6                  Suriya Kovarik,W. EDMOND

## 2022-06-03 NOTE — Op Note (Signed)
Date of surgery: 06/03/2022 Preoperative diagnosis: Spondylosis with retrolisthesis T8-T9, thoracic myelopathy.  History of fusion T9 to the sacrum Postoperative diagnosis: Same Procedure: Decompression of T8 and T9 with trans foraminal body arthrodesis using mandible titanium spacer allograft pedicle fixation from T8 to previous hardware at T9 to the pelvis.  Posterolateral arthrodesis with local autograft allograft and Prody os. Surgeon: Kristeen Miss First Assistant: Duffy Rhody, MD Anesthesia: General endotracheal Indications: Daniel Wagner is a 82 year old individual has had significant back and bilateral lower extremity pain and particularly been troubled with midthoracic back pain.  He had a arthrodesis from T9 down to the sacrum was done in a series of stages but now he has instability at the T8-T9 level with the severe disc degenerative condition at T8-T9 causing a retrolisthesis and spinal canal compromise he is advised regarding the need for surgical decompression and stabilization.  Procedure the patient was brought to the operating room supine on the stretcher.  After the smooth induction of general endotracheal anesthesia, he was carefully turned prone.  The back was prepped with alcohol DuraPrep exposing the entirety of the previously made incision from the sacrum all the way up to T9 and extending this into the upper thoracic spine.  The skin was marked in the previously made incision and extension was drawn approximately 2 inches cephalad.  The timeout was performed and then the skin in this area was infiltrated with 10 cc of lidocaine with epinephrine mixed 50-50 with half percent Marcaine.  Vertical incision was created and carried down to the thoracodorsal fascia which was opened on either side of midline.  Soon the previously placed hardware at T9 could be exposed.  The dissection was carried inferiorly along the T9-T10 and of the rod was exposed in these areas.  Because the construct  appeared to be quite solid below the T9 arthrodesis it was felt that fixation of T8 and T9 could be obtained with an interbody arthrodesis and posterior supplementation however hardware was felt necessary to maintain the stability in that regard we then cut the rod just below the T10 screw so that we could apply a rod extension into each side leave out the T10 screws and keep the fixation at T9 and add the new fixation at T8 plan was then activated by cutting the rod below the T10 screws and high-speed metal bit and carefully protecting all the soft tissues from exposure to the metal shavings.  This time is also decided that because of the use of the high-speed drill and metal cutting bit we could not use the Cell Saver and this was disconnected.  No blood was retrieved.  Once the rod was cut the soft tissues in the area that showed any signs of metal shavings were carefully dissected and picked away with the metal being removed completely.  Once this was verified attention was turned to the upper portion of the construct the rods were removed the T9 and T10 pedicle screws were removed.  At this point then on the right side the extraforaminal zone was cleared and a large extraforaminal foraminotomies created removing a portion of the lamina of T8 and T9 exposing the lateral aspect of the common dural tube and the ventral aspect of the disc space.  The disc base was then carefully dissected and identified.  The disc space itself was noted to be rather closed and tight and a small osteotome was required to enter the disc space fluoroscopic verification of this opening was obtained and then  by using a series of disc shavers and dilators were able to move some bony endplate material and residual very desiccated disc material to allow for some decortication of the interspace.  Then a trial spacer was placed and ultimately was felt that an expandable cage measuring 6 mm in height 28 mm in depth could be placed.  This was  placed into the interspace under fluoroscopic visualization and expanded to approximately 9 mm in size.  Then the graft attachment was placed and demineralized bone matrix for total volume of 5 cc was pumped into the interspace.  Next entry sites were chosen at T8 and verified with fluoroscopy 5.5 x 45 mm fenestrated pedicle screws were placed into the T8 vertebrae then 1 cc of methacrylate was injected through the fenestrations to augment the pedicle screws at T8.  The previous pedicle screws at T9-T10 had already been augmented the T9 screws were replaced with nonfenestrated 5.5 x 45 mm screws.  The anterior aspect of the vertebrae from T8-T9 were decorticated.  Sats were open on either side.  Then straight rods were precontoured to fit in the end of the connector that was adapted to rod just above the T11 screw.  This was connected and bent to fit in a neutral construct between T8-T9 and the connector.  Once this was verified bilaterally the screws were tightened to the final torquing vision.  Final radiographs were obtained in AP and lateral projection.  The posterolateral bone graft was then packed into the anterior laminar space at the T8 and T9 levels.  With this hemostasis in the soft tissues was obtained meticulously the thoracodorsal fascia was closed with #1 Vicryl in interrupted fashion 25 cc of half percent Marcaine was injected into the paraspinous fascia the skin was then closed with 2-0 and 3-0 Vicryl in a subcuticular fashion and surgical staples were placed in the skin as the patient had tendency to ooze.  Blood loss was 350 cc.  The patient tolerated procedure well.

## 2022-06-03 NOTE — H&P (Signed)
Daniel Wagner is an 82 y.o. male.   Chief Complaint: Thoracic back pain HPI: Daniel Wagner is a 82 year old individual whose had extensive lumbar spondylitic disease with a fusion initially in the lower lumbar spine extending up to the upper lumbar spine then across the thoracolumbar junction.  His last fusion was from T9 down to the sacrum.  He has evidence of adjacent level disease at T8-T9 with some retropulsion of the T8 vertebrae and relative areas of stenosis and severe facet hypertrophy.  He has severe pain particularly when he leans back his upper thoracic spine.  The hardware itself is not protruding.  Nonetheless because of the severity of the pain he has been advised regarding decompression and stabilization of the T8-T9 level.  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   History of kidney stones 03/2013   Lithotrispy   Hyperlipidemia    Hypertension    Leg pain    While Walking   Pneumonia    Sinus problem     Past Surgical History:  Procedure Laterality Date   ABDOMINAL EXPOSURE N/A 07/30/2021   Procedure: ABDOMINAL EXPOSURE;  Surgeon: Cherre Robins, MD;  Location: [place];  Service: Vascular;  Laterality: N/A;   ANTERIOR CERVICAL DECOMPRESSION/DISCECTOMY FUSION 4 LEVELS N/A 06/30/2012   Procedure: ANTERIOR CERVICAL DECOMPRESSION/DISCECTOMY FUSION 4 LEVELS;  Surgeon: Kristeen Miss, MD;  Location: [place] [facility];  Service: Neurosurgery;  Laterality: N/A;  C3-4 C4-5 C5-6 C6-7 Anterior cervical decompression/diskectomy/fusion   ANTERIOR LUMBAR FUSION N/A 07/30/2021   Procedure: LUMBAR FIVE-SACRAL ONE ANTERIOR LUMBAR INTERBODY FUSION;  Surgeon: Kristeen Miss, MD;  Location: [place];  Service: Neurosurgery;  Laterality: N/A;   APPLICATION OF ROBOTIC ASSISTANCE FOR SPINAL PROCEDURE N/A 06/26/2020   Procedure: APPLICATION OF ROBOTIC  ASSISTANCE FOR SPINAL PROCEDURE;  Surgeon: Kristeen Miss, MD;  Location: [place];  Service: Neurosurgery;  Laterality: N/A;   BACK SURGERY  08/2010   lumbar fusion   CATARACT EXTRACTION, BILATERAL     CHOLECYSTECTOMY  1990's   COLONOSCOPY  02/04/2015   NECK SURGERY     x2   sinus surgeries     x2   SKIN CANCER EXCISION     arm chest and head basal cell   TRANSURETHRAL RESECTION OF PROSTATE  1999    Family History  Problem Relation Age of Onset   Stroke Mother    Heart disease Mother    Emphysema Father    Heart disease Sister    Tongue cancer Brother    Heart disease Brother    Colon cancer Neg Hx    Colon polyps Neg Hx    Esophageal cancer Neg Hx    Rectal cancer Neg Hx    Stomach cancer Neg Hx    Social History:  reports that he has never smoked. He has never used smokeless tobacco. He reports current alcohol use of about 7.0 standard drinks of alcohol per week. He reports that he does not use drugs.  Allergies:  Allergies  Allergen Reactions   Aspirin Other (See Comments)    Jittery if taking large quantities    Medications Prior to Admission  Medication Sig Dispense Refill   Calcium Carb-Cholecalciferol (CALCIUM 600/VITAMIN D PO) Take 1 tablet by mouth daily.  Coenzyme Q10 (COQ10) 100 MG CAPS Take 100 mg by mouth daily.     CRANBERRY PO Take 1 tablet by mouth daily.     diclofenac Sodium (VOLTAREN) 1 % GEL Apply 1 application. topically daily.     dicyclomine (BENTYL) 10 MG capsule Take 10 mg by mouth 2 (two) times daily as needed for spasms.     doxazosin (CARDURA) 4 MG tablet Take 4 mg by mouth daily.      Echinacea 500 MG CAPS Take 500 mg by mouth in the morning and at bedtime.     esomeprazole (NEXIUM) 20 MG capsule Take 20 mg by mouth daily with breakfast.     glucosamine-chondroitin 500-400 MG tablet Take 1 tablet by mouth in the morning and at bedtime.     guaiFENesin (MUCINEX) 600 MG 12 hr tablet Take 600 mg by mouth 2 (two) times daily.      HYDROcodone-acetaminophen (NORCO) 7.5-325 MG tablet Take 2 tablets by mouth in the morning and at bedtime.     lisinopril (PRINIVIL,ZESTRIL) 20 MG tablet Take 20 mg by mouth every evening.      lovastatin (MEVACOR) 40 MG tablet Take 40 mg by mouth daily.     Mag Aspart-Potassium Aspart (RA POTASSIUM/MAGNESIUM PO) Take 1 tablet by mouth daily.     meloxicam (MOBIC) 7.5 MG tablet Take 7.5 mg by mouth in the morning and at bedtime.     Menthol-Camphor (TIGER BALM ARTHRITIS RUB) 11-11 % CREA Apply 1 application. topically daily as needed (back pain).     niacin 500 MG tablet Take 500 mg by mouth daily.     Omega-3 Fatty Acids (SALMON OIL PO) Take 1 capsule by mouth at bedtime.     Polyethyl Glycol-Propyl Glycol 0.4-0.3 % SOLN Apply 1 drop to eye every 6 (six) hours as needed (dry eyes).     Probiotic Product (PRO-BIOTIC BLEND PO) Take 1 capsule by mouth daily.     Simethicone (GAS-X PO) Take 1 capsule by mouth 3 (three) times daily as needed (bloating). Uses [employer] brand     testosterone cypionate (DEPOTESTOSTERONE CYPIONATE) 200 MG/ML injection Inject 100 mg into the muscle every 28 (twenty-eight) days. For IM use only     Turmeric 500 MG CAPS Take 500 mg by mouth daily.     zinc gluconate 50 MG tablet Take 50 mg by mouth daily.     zolpidem (AMBIEN) 10 MG tablet Take 10 mg by mouth daily as needed for sleep.     HYDROcodone-acetaminophen (NORCO) 10-325 MG tablet Take 1 tablet by mouth every 4 (four) hours as needed for moderate pain or severe pain. (Patient not taking: Reported on 05/10/2022) 40 tablet 0   methocarbamol (ROBAXIN) 500 MG tablet Take 1 tablet (500 mg total) by mouth every 6 (six) hours as needed for muscle spasms. (Patient not taking: Reported on 05/10/2022) 40 tablet 3    Results for orders placed or performed during the hospital encounter of 06/03/22 (from the past 48 hour(s))  Type and screen     Status: None   Collection Time: 06/03/22  6:44 AM  Result Value Ref Range    ABO/RH(D) A POS    Antibody Screen NEG    Sample Expiration      06/06/2022,2359 Performed at [facility], [place] [address] [place], [place] [address]    No results found.  Review of Systems  Constitutional:  Positive for activity change.  Musculoskeletal:  Positive for back pain and myalgias.  All other systems reviewed and are negative.   Blood pressure (!) 148/85, pulse 61, temperature 98.6 F (37 C), temperature source Oral, resp. rate 18, height 6' (1.829 m), weight 84.4 kg, SpO2 97 %. Physical Exam Constitutional:      Appearance: Normal appearance. He is normal weight.  HENT:     Head: Normocephalic and atraumatic.     Right Ear: Tympanic membrane, ear canal and external ear normal.     Left Ear: Tympanic membrane, ear canal and external ear normal.     Nose: Nose normal.     Mouth/Throat:     Mouth: Mucous membranes are moist.     Pharynx: Oropharynx is clear.  Eyes:     Extraocular Movements: Extraocular movements intact.     Conjunctiva/sclera: Conjunctivae normal.     Pupils: Pupils are equal, round, and reactive to light.  Cardiovascular:     Rate and Rhythm: Normal rate and regular rhythm.     Pulses: Normal pulses.     Heart sounds: Normal heart sounds.  Pulmonary:     Effort: Pulmonary effort is normal.     Breath sounds: Normal breath sounds.  Abdominal:     General: Abdomen is flat. Bowel sounds are normal.     Palpations: Abdomen is soft.  Musculoskeletal:        General: Normal range of motion.     Cervical back: Normal range of motion and neck supple.  Skin:    General: Skin is warm and dry.     Capillary Refill: Capillary refill takes less than 2 seconds.  Neurological:     General: No focal deficit present.     Mental Status: He is alert.  Psychiatric:        Mood and Affect: Mood normal.        Behavior: Behavior normal.        Thought Content: Thought content normal.        Judgment: Judgment normal.       Assessment/Plan Spondylosis with myelopathy T8-T9.  Plan: Decompression and fusion T8-T9.  Earleen Newport, MD 06/03/2022, 7:53 AM

## 2022-06-03 NOTE — Anesthesia Procedure Notes (Signed)
Procedure Name: Intubation Date/Time: 06/03/2022 8:14 AM  Performed by: Janene Harvey, CRNAPre-anesthesia Checklist: Patient identified, Emergency Drugs available, Suction available and Patient being monitored Patient Re-evaluated:Patient Re-evaluated prior to induction Oxygen Delivery Method: Circle system utilized Preoxygenation: Pre-oxygenation with 100% oxygen Induction Type: IV induction Ventilation: Mask ventilation without difficulty and Oral airway inserted - appropriate to patient size Laryngoscope Size: Mac and 4 Grade View: Grade I Tube type: Oral Tube size: 7.5 mm Number of attempts: 1 Airway Equipment and Method: Stylet and Oral airway Placement Confirmation: ETT inserted through vocal cords under direct vision, positive ETCO2 and breath sounds checked- equal and bilateral Secured at: 22 cm Tube secured with: Tape Dental Injury: Teeth and Oropharynx as per pre-operative assessment

## 2022-06-04 LAB — BASIC METABOLIC PANEL
Anion gap: 9 (ref 5–15)
BUN: 13 mg/dL (ref 8–23)
CO2: 23 mmol/L (ref 22–32)
Calcium: 8.4 mg/dL — ABNORMAL LOW (ref 8.9–10.3)
Chloride: 100 mmol/L (ref 98–111)
Creatinine, Ser: 1.3 mg/dL — ABNORMAL HIGH (ref 0.61–1.24)
GFR, Estimated: 56 mL/min — ABNORMAL LOW (ref >60)
Glucose, Bld: 201 mg/dL — ABNORMAL HIGH (ref 70–99)
Potassium: 4 mmol/L (ref 3.5–5.1)
Sodium: 132 mmol/L — ABNORMAL LOW (ref 135–145)

## 2022-06-04 LAB — CBC
HCT: 35.9 % — ABNORMAL LOW (ref 39.0–52.0)
Hemoglobin: 12.3 g/dL — ABNORMAL LOW (ref 13.0–17.0)
MCH: 33.7 pg (ref 26.0–34.0)
MCHC: 34.3 g/dL (ref 30.0–36.0)
MCV: 98.4 fL (ref 80.0–100.0)
Platelets: 177 10*3/uL (ref 150–400)
RBC: 3.65 MIL/uL — ABNORMAL LOW (ref 4.22–5.81)
RDW: 13.7 % (ref 11.5–15.5)
WBC: 11.9 10*3/uL — ABNORMAL HIGH (ref 4.0–10.5)
nRBC: 0 % (ref 0.0–0.2)

## 2022-06-04 MED ORDER — METHOCARBAMOL 500 MG PO TABS: 500.0000 mg | ORAL_TABLET | Freq: Four times a day (QID) | ORAL | 3 refills | Status: AC | PRN

## 2022-06-04 MED ORDER — ACETAMINOPHEN 650 MG RE SUPP: 650.0000 mg | RECTAL | Status: DC | PRN

## 2022-06-04 MED ORDER — ACETAMINOPHEN 325 MG PO TABS
325.0000 mg | ORAL_TABLET | ORAL | Status: DC | PRN
  Administered 2022-06-04: 325 mg via ORAL
  Filled 2022-06-04: qty 1

## 2022-06-04 MED ORDER — HYDROMORPHONE HCL 2 MG PO TABS: 2.0000 mg | ORAL_TABLET | Freq: Two times a day (BID) | ORAL | 0 refills | Status: AC | PRN

## 2022-06-04 NOTE — Plan of Care (Signed)
Pt doing well. Pt and wife given D/C instructions with verbal understanding. Rx's were sent to the pharmacy by MD. Pt's incision is clean and dry with no sign of infection. Dressing was changed per MD order. Pt's IV was removed prior to D/C. Pt D/C'd home via wheelchair per MD order. Pt is stable @ D/C and has no other needs at this time. Holli Humbles, RN

## 2022-06-04 NOTE — Evaluation (Signed)
Physical Therapy Evaluation Patient Details Name: Daniel Wagner MRN: [id number] DOB: [birth year] Today's Date: 06/04/2022  History of Present Illness  Patient is a 82 y/o male who presents on 2/22 for T8-T9 TLIF. PMH includes back pain with previous back surgeries, HTN.  Clinical Impression  Patient presents with pain and post surgical deficits s/p above. Pt is independent for ADLs/IADLs and walking PTA and lives at home with his spouse. Today, pt tolerated bed mobility, transfers and ambulation Mod I with use of RW for support. Education re: back precautions, positioning, log roll technique, walking recommendations, use of rail for bed if needed etc. Pt is functioning at Mod I level and has support from spouse at home. ALl education completed. Discharge from therapy.       Recommendations for follow up therapy are one component of a multi-disciplinary discharge planning process, led by the attending physician.  Recommendations may be updated based on patient status, additional functional criteria and insurance authorization.  Follow Up Recommendations No PT follow up      Assistance Recommended at Discharge PRN  Patient can return home with the following  Assist for transportation;Assistance with cooking/housework    Equipment Recommendations None recommended by PT  Recommendations for Other Services       Functional Status Assessment Patient has had a recent decline in their functional status and demonstrates the ability to make significant improvements in function in a reasonable and predictable amount of time.     Precautions / Restrictions Precautions Precautions: Back Precaution Booklet Issued: Yes (comment) Precaution Comments: Provided handout and reviewed precautions Required Braces or Orthoses: Spinal Brace Spinal Brace: Lumbar corset;Applied in sitting position Restrictions Weight Bearing Restrictions: No      Mobility  Bed Mobility Overal bed mobility: Modified  Independent             General bed mobility comments: Able to perform log roll technique with HOB flat and no rails to simulate home.    Transfers Overall transfer level: Modified independent Equipment used: Rolling walker (2 wheels)               General transfer comment: Stood from EOB x1, no difficulties.    Ambulation/Gait Ambulation/Gait assistance: Modified independent (Device/Increase time) Gait Distance (Feet): 300 Feet Assistive device: Rolling walker (2 wheels) Gait Pattern/deviations: Step-through pattern, Decreased stride length   Gait velocity interpretation: 1.31 - 2.62 ft/sec, indicative of limited community ambulator   General Gait Details: Steady gait with good gait pattern, cues to relax shoulders.  Stairs            Wheelchair Mobility    Modified Rankin (Stroke Patients Only)       Balance Overall balance assessment: Mild deficits observed, not formally tested                                           Pertinent Vitals/Pain Pain Assessment Pain Assessment: Faces Faces Pain Scale: Hurts a little bit Pain Location: Incisional Pain Descriptors / Indicators: Tender, Operative site guarding Pain Intervention(s): Monitored during session, Repositioned    Home Living Family/patient expects to be discharged to:: Private residence Living Arrangements: Spouse/significant other Available Help at Discharge: Family;Available 24 hours/day Type of Home: House Home Access: Stairs to enter Entrance Stairs-Rails: Right Entrance Stairs-Number of Steps: 1   Home Layout: One level Home Equipment: Conservation officer, nature (2 wheels);Hand held shower head;Shower seat  Prior Function Prior Level of Function : Independent/Modified Independent;Driving                     Hand Dominance   Dominant Hand: Right    Extremity/Trunk Assessment   Upper Extremity Assessment Upper Extremity Assessment: Defer to OT evaluation     Lower Extremity Assessment Lower Extremity Assessment: Overall WFL for tasks assessed;RLE deficits/detail RLE Deficits / Details: 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    Exercises     Assessment/Plan    PT Assessment Patient does not need any further PT services  PT Problem List         PT Treatment Interventions      PT Goals (Current goals can be found in the Care Plan section)  Acute Rehab PT Goals Patient Stated Goal: to go home and get back to traveling PT Goal Formulation: All assessment and education complete, DC therapy    Frequency       Co-evaluation               AM-PAC PT "6 Clicks" Mobility  Outcome Measure Help needed turning from your back to your side while in a flat bed without using bedrails?: None Help needed moving from lying on your back to sitting on the side of a flat bed without using bedrails?: None Help needed moving to and from a bed to a chair (including a wheelchair)?: None Help needed standing up from a chair using your arms (e.g., wheelchair or bedside chair)?: None Help needed to walk in hospital room?: A Little Help needed climbing 3-5 steps with a railing? : A Little 6 Click Score: 22    End of Session Equipment Utilized During Treatment: Gait belt Activity Tolerance: Patient tolerated treatment well Patient left: in bed;with call bell/phone within reach (sitting EOB) Nurse Communication: Mobility status PT Visit Diagnosis:  Pain Pain - part of body:  (back)    Time: 0740-0800 PT Time Calculation (min) (ACUTE ONLY): 20 min   Charges:   PT Evaluation $PT Eval Low Complexity: 1 Low          Marisa Severin, PT, DPT Acute Rehabilitation Services Secure chat preferred Office [place] 06/04/2022, 9:05 AM

## 2022-06-04 NOTE — Discharge Summary (Signed)
Physician Discharge Summary  Patient ID: JACODY CAPITO MRN: [id number] DOB/AGE: 1940-05-10 82 y.o.  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  Retrolisthesis T8-T9.  History of fusion T9 to sacrum. Principal Problem:   Myelopathy concurrent with and due to spinal stenosis of thoracic region [facility])   Discharged Condition: good  Hospital Course: Patient was admitted to undergo surgery which she tolerated well.  Consults:  None  Significant Diagnostic Studies: None  Treatments: See op note  Discharge Exam: Blood pressure (!) 113/49, pulse 100, temperature 99 F (37.2 C), temperature source Oral, resp. rate 16, height 6' (1.829 m), weight 84.4 kg, SpO2 97 %. Station and gait are intact motor function is intact  Disposition: Discharge disposition: 01-Home or Self Care       Discharge Instructions     Call MD for:  redness, tenderness, or signs of infection (pain, swelling, redness, odor or green/yellow discharge around incision site)   Complete by: As directed    Call MD for:  severe uncontrolled pain   Complete by: As directed    Call MD for:  temperature >100.4   Complete by: As directed    Diet - low sodium heart healthy   Complete by: As directed    Incentive spirometry RT   Complete by: As directed    Increase activity slowly   Complete by: As directed       Allergies as of 06/04/2022       Reactions   Aspirin Other (See Comments)   Jittery if taking large quantities        Medication List     TAKE these medications    CALCIUM 600/VITAMIN D PO Take 1 tablet by mouth daily.   CoQ10 100 MG Caps Take 100 mg by mouth daily.   CRANBERRY PO Take 1 tablet by mouth daily.   diclofenac Sodium 1 % Gel Commonly known as:  VOLTAREN Apply 1 application. topically daily.   dicyclomine 10 MG capsule Commonly known as: BENTYL Take 10 mg by mouth 2 (two) times daily as needed for spasms.   doxazosin 4 MG tablet Commonly known as: CARDURA Take 4 mg by mouth daily.   Echinacea 500 MG Caps Take 500 mg by mouth in the morning and at bedtime.   esomeprazole 20 MG capsule Commonly known as: NEXIUM Take 20 mg by mouth daily with breakfast.   GAS-X PO Take 1 capsule by mouth 3 (three) times daily as needed (bloating). Uses [employer] brand   glucosamine-chondroitin 500-400 MG tablet Take 1 tablet by mouth in the morning and at bedtime.   guaiFENesin 600 MG 12 hr tablet Commonly known as: MUCINEX Take 600 mg by mouth 2 (two) times daily.   HYDROcodone-acetaminophen 10-325 MG tablet Commonly known as: NORCO Take 1 tablet by mouth every 4 (four) hours as needed for moderate pain or severe pain.   HYDROcodone-acetaminophen 7.5-325 MG tablet Commonly known as: NORCO Take 2 tablets by mouth in the morning and at bedtime.   HYDROmorphone 2 MG tablet Commonly known as: Dilaudid Take 1 tablet (2 mg total) by mouth every 12 (twelve) hours as needed for up to 5 days for severe pain.   lisinopril 20 MG tablet Commonly known as: ZESTRIL Take 20 mg by mouth every evening.   lovastatin 40 MG tablet Commonly known as:  MEVACOR Take 40 mg by mouth daily.   meloxicam 7.5 MG tablet Commonly known as: MOBIC Take 7.5 mg by mouth in the morning and at bedtime.   methocarbamol 500 MG tablet Commonly known as: ROBAXIN Take 1 tablet (500 mg total) by mouth every 6 (six) hours as needed for muscle spasms. What changed: Another medication with the same name was added. Make sure you understand how and when to take each.   methocarbamol 500 MG tablet Commonly known as: ROBAXIN Take 1 tablet (500 mg total) by mouth every 6 (six) hours as needed for muscle spasms. What changed: You were already taking a medication with  the same name, and this prescription was added. Make sure you understand how and when to take each.   niacin 500 MG tablet Commonly known as: VITAMIN B3 Take 500 mg by mouth daily.   Polyethyl Glycol-Propyl Glycol 0.4-0.3 % Soln Apply 1 drop to eye every 6 (six) hours as needed (dry eyes).   PRO-BIOTIC BLEND PO Take 1 capsule by mouth daily.   RA POTASSIUM/MAGNESIUM PO Take 1 tablet by mouth daily.   SALMON OIL PO Take 1 capsule by mouth at bedtime.   testosterone cypionate 200 MG/ML injection Commonly known as: DEPOTESTOSTERONE CYPIONATE Inject 100 mg into the muscle every 28 (twenty-eight) days. For IM use only   Tiger Balm Arthritis Rub 11-11 % Crea Generic drug: Menthol-Camphor Apply 1 application. topically daily as needed (back pain).   Turmeric 500 MG Caps Take 500 mg by mouth daily.   zinc gluconate 50 MG tablet Take 50 mg by mouth daily.   zolpidem 10 MG tablet Commonly known as: AMBIEN Take 10 mg by mouth daily as needed for sleep.         Signed: Earleen Newport 06/04/2022, 8:39 AM

## 2022-06-04 NOTE — Evaluation (Signed)
Occupational Therapy Evaluation Patient Details Name: Daniel Wagner MRN: [id number] DOB: May 01, 1940 Today's Date: 06/04/2022   History of Present Illness Patient is a 82 y/o male who presents on 2/22 for T8-T9 TLIF. PMH includes back pain with previous back surgeries, HTN.   Clinical Impression   Patient admitted for the procedure above.  Patient has been seen in the past for multiple back surgeries, and has a thorough understanding of all back precautions.  Spouse will be at the home to provide any needed assist.  No further OT needs in the acute setting, recommend follow up as prescribed by MD.        Recommendations for follow up therapy are one component of a multi-disciplinary discharge planning process, led by the attending physician.  Recommendations may be updated based on patient status, additional functional criteria and insurance authorization.   Follow Up Recommendations  No OT follow up     Assistance Recommended at Discharge Set up Supervision/Assistance  Patient can return home with the following Assist for transportation;A little help with bathing/dressing/bathroom    Functional Status Assessment  Patient has not had a recent decline in their functional status  Equipment Recommendations  None recommended by OT    Recommendations for Other Services       Precautions / Restrictions Precautions Precautions: Back Precaution Booklet Issued: Yes (comment) Precaution Comments: Provided handout and reviewed precautions Required Braces or Orthoses: Spinal Brace Spinal Brace: Lumbar corset;Applied in sitting position Restrictions Weight Bearing Restrictions: No      Mobility Bed Mobility Overal bed mobility: Modified Independent                  Transfers Overall transfer level: Modified independent Equipment used: None                      Balance Overall balance assessment: Mild deficits observed, not formally tested                                          ADL either performed or assessed with clinical judgement   ADL Overall ADL's : At baseline                                       General ADL Comments: Needing assist with socks and shoes     Vision Patient Visual Report: No change from baseline       Perception     Praxis      Pertinent Vitals/Pain Pain Assessment Pain Assessment: Faces Faces Pain Scale: Hurts a little bit Pain Location: Incisional Pain Descriptors / Indicators: Tender Pain Intervention(s): Monitored during session     Hand Dominance Right   Extremity/Trunk Assessment Upper Extremity Assessment Upper Extremity Assessment: Overall WFL for tasks assessed   Lower Extremity Assessment Lower Extremity Assessment: Defer to PT evaluation   Cervical / Trunk Assessment Cervical / Trunk Assessment: Back Surgery   Communication Communication Communication: No difficulties   Cognition Arousal/Alertness: Awake/alert Behavior During Therapy: WFL for tasks assessed/performed Overall Cognitive Status: Within Functional Limits for tasks assessed                                       General  Comments   VSS on RA    Exercises     Shoulder Instructions      Home Living Family/patient expects to be discharged to:: Private residence Living Arrangements: Spouse/significant other Available Help at Discharge: Family;Available 24 hours/day Type of Home: House Home Access: Stairs to enter [employer] of Steps: 1 Entrance Stairs-Rails: Right Home Layout: One level     Bathroom Shower/Tub: Occupational psychologist: Handicapped height Bathroom Accessibility: Yes How Accessible: Accessible via walker Home Equipment: [place] (2 wheels);Hand held shower head;Shower seat          Prior Functioning/Environment Prior Level of Function : Independent/Modified Independent;Driving                        OT  Problem List: Pain      OT Treatment/Interventions:      OT Goals(Current goals can be found in the care plan section) Acute Rehab OT Goals Patient Stated Goal: Return home OT Goal Formulation: With patient Time For Goal Achievement: 06/04/22 Potential to Achieve Goals: Good  OT Frequency:      Co-evaluation              AM-PAC OT "6 Clicks" Daily Activity     Outcome Measure Help from another person eating meals?: None Help from another person taking care of personal grooming?: None Help from another person toileting, which includes using toliet, bedpan, or urinal?: None Help from another person bathing (including washing, rinsing, drying)?: A Little Help from another person to put on and taking off regular upper body clothing?: None Help from another person to put on and taking off regular lower body clothing?: A Little 6 Click Score: 22   End of Session Nurse Communication: Mobility status  Activity Tolerance: Patient tolerated treatment well Patient left: in bed;with call bell/phone within reach;with family/visitor present  OT Visit Diagnosis: Unsteadiness on feet (R26.81)                Time: [id number] OT Time Calculation (min): 18 min Charges:  OT General Charges $OT Visit: 1 Visit OT Evaluation $OT Eval Moderate Complexity: 1 Mod  06/04/2022  RP, OTR/L  Acute Rehabilitation Services  Office:  [phone]   Metta Clines 06/04/2022, 9:02 AM

## 2022-06-07 ENCOUNTER — Encounter (HOSPITAL_COMMUNITY): Payer: Self-pay | Admitting: Neurological Surgery

## 2022-06-08 ENCOUNTER — Encounter (HOSPITAL_COMMUNITY): Payer: Self-pay | Admitting: Neurological Surgery

## 2022-06-18 MED FILL — Heparin Sodium (Porcine) Inj 1000 Unit/ML: INTRAMUSCULAR | Qty: 30 | Status: AC

## 2022-06-18 MED FILL — Sodium Chloride IV Soln 0.9%: INTRAVENOUS | Qty: 2000 | Status: AC

## 2022-08-27 NOTE — Progress Notes (Signed)
Sent message, via epic in basket, requesting orders in epic from surgeon.  

## 2022-08-28 ENCOUNTER — Ambulatory Visit: Payer: Self-pay | Admitting: Student

## 2022-09-01 ENCOUNTER — Encounter (HOSPITAL_COMMUNITY): Payer: Self-pay

## 2022-09-01 NOTE — Patient Instructions (Addendum)
SURGICAL WAITING ROOM VISITATION Patients having surgery or a procedure may have no more than 2 support people in the waiting area - these visitors may rotate.    Children under the age of 55 must have an adult with them who is not the patient.  If the patient needs to stay at the hospital during part of their recovery, the visitor guidelines for inpatient rooms apply. Pre-op nurse will coordinate an appropriate time for 1 support person to accompany patient in pre-op.  This support person may not rotate.    Please refer to the [facility] website for the visitor guidelines for Inpatients (after your surgery is over and you are in a regular room).       Your procedure is scheduled on: 09-15-22   Report to [facility] Main Entrance    Report to admitting at 6:15 AM   Call this number if you have problems the morning of surgery [phone]   Do not eat food :After Midnight.   After Midnight you may have the following liquids until 5:30 AM DAY OF SURGERY  Water Non-Citrus Juices (without pulp, NO RED-Apple, White grape, White cranberry) Black Coffee (NO MILK/CREAM OR CREAMERS, sugar ok)  Clear Tea (NO MILK/CREAM OR CREAMERS, sugar ok) regular and decaf                             Plain Jell-O (NO RED)                                           Fruit ices (not with fruit pulp, NO RED)                                     Popsicles (NO RED)                                                               Sports drinks like Gatorade (NO RED)                   The day of surgery:  Drink ONE (1) Pre-Surgery G2 at 5:30 AM the morning of surgery. Drink in one sitting. Do not sip.  This drink was given to you during your hospital  pre-op appointment visit. Nothing else to drink after completing the Pre-Surgery G2.          If you have questions, please contact your surgeon's office.   FOLLOW  ANY ADDITIONAL PRE OP INSTRUCTIONS YOU RECEIVED FROM YOUR SURGEON'S OFFICE!!!     Oral  Hygiene is also important to reduce your risk of infection.                                    Remember - BRUSH YOUR TEETH THE MORNING OF SURGERY WITH YOUR REGULAR TOOTHPASTE   Do NOT smoke after Midnight   Take these medicines the morning of surgery with A SIP OF WATER:   Doxazosin  Esomeprazole  Lovastatin  Zyrtec  Gabapentin  Tylenol if needed  Hydrocodone if needed                              You may not have any metal on your body including  jewelry, and body piercing             Do not wear lotions, powders, cologne, or deodorant  Do not wear nail polish including gel and S&S, artificial/acrylic nails, or any other type of covering on natural nails including finger and toenails. If you have artificial nails, gel coating, etc. that needs to be removed by a nail salon please have this removed prior to surgery or surgery may need to be canceled/ delayed if the surgeon/ anesthesia feels like they are unable to be safely monitored.   Do not shave  48 hours prior to surgery.               Men may shave face and neck.   Do not bring valuables to the hospital. [facility] IS NOT RESPONSIBLE   FOR VALUABLES.   Contacts, dentures or bridgework may not be worn into surgery.   DO NOT BRING YOUR HOME MEDICATIONS TO THE HOSPITAL. PHARMACY WILL DISPENSE MEDICATIONS LISTED ON YOUR MEDICATION LIST TO YOU DURING YOUR ADMISSION IN THE HOSPITAL!    Patients discharged on the day of surgery will not be allowed to drive home.  Someone NEEDS to stay with you for the first 24 hours after anesthesia.               Please read over the following fact sheets you were given: IF YOU HAVE QUESTIONS ABOUT YOUR PRE-OP INSTRUCTIONS PLEASE CALL [phone] [place]  If you received a COVID test during your pre-op visit  it is requested that you wear a mask when out in public, stay away from anyone that may not be feeling well and notify your surgeon if you develop symptoms. If you test positive for Covid or  have been in contact with anyone that has tested positive in the last 10 days please notify you surgeon.    Pre-operative 5 CHG Bath Instructions   You can play a key role in reducing the risk of infection after surgery. Your skin needs to be as free of germs as possible. You can reduce the number of germs on your skin by washing with CHG (chlorhexidine gluconate) soap before surgery. CHG is an antiseptic soap that kills germs and continues to kill germs even after washing.   DO NOT use if you have an allergy to chlorhexidine/CHG or antibacterial soaps. If your skin becomes reddened or irritated, stop using the CHG and notify one of our RNs at  [phone] .   Please shower with the CHG soap starting 4 days before surgery using the following schedule:     Please keep in mind the following:  DO NOT shave, including legs and underarms, starting the day of your first shower.   You may shave your face at any point before/day of surgery.  Place clean sheets on your bed the day you start using CHG soap. Use a clean washcloth (not used since being washed) for each shower. DO NOT sleep with pets once you start using the CHG.   CHG Shower Instructions:  If you choose to wash your hair and private area, wash first with your normal shampoo/soap.  After you use shampoo/soap, rinse your hair and body thoroughly to  remove shampoo/soap residue.  Turn the water OFF and apply about 3 tablespoons (45 ml) of CHG soap to a CLEAN washcloth.  Apply CHG soap ONLY FROM YOUR NECK DOWN TO YOUR TOES (washing for 3-5 minutes)  DO NOT use CHG soap on face, private areas, open wounds, or sores.  Pay special attention to the area where your surgery is being performed.  If you are having back surgery, having someone wash your back for you may be helpful. Wait 2 minutes after CHG soap is applied, then you may rinse off the CHG soap.  Pat dry with a clean towel  Put on clean clothes/pajamas   If you choose to wear  lotion, please use ONLY the CHG-compatible lotions on the back of this paper.     Additional instructions for the day of surgery: DO NOT APPLY any lotions, deodorants, cologne, or perfumes.   Put on clean/comfortable clothes.  Brush your teeth.  Ask your nurse before applying any prescription medications to the skin.      CHG Compatible Lotions   Aveeno Moisturizing lotion  Cetaphil Moisturizing Cream  Cetaphil Moisturizing Lotion  Clairol Herbal Essence Moisturizing Lotion, Dry Skin  Clairol Herbal Essence Moisturizing Lotion, Extra Dry Skin  Clairol Herbal Essence Moisturizing Lotion, Normal Skin  Curel Age Defying Therapeutic Moisturizing Lotion with Alpha Hydroxy  Curel Extreme Care Body Lotion  Curel Soothing Hands Moisturizing Hand Lotion  Curel Therapeutic Moisturizing Cream, Fragrance-Free  Curel Therapeutic Moisturizing Lotion, Fragrance-Free  Curel Therapeutic Moisturizing Lotion, Original Formula  Eucerin Daily Replenishing Lotion  Eucerin Dry Skin Therapy Plus Alpha Hydroxy Crme  Eucerin Dry Skin Therapy Plus Alpha Hydroxy Lotion  Eucerin Original Crme  Eucerin Original Lotion  Eucerin Plus Crme Eucerin Plus Lotion  Eucerin TriLipid Replenishing Lotion  Keri Anti-Bacterial Hand Lotion  Keri Deep Conditioning Original Lotion Dry Skin Formula Softly Scented  Keri Deep Conditioning Original Lotion, Fragrance Free Sensitive Skin Formula  Keri Lotion Fast Absorbing Fragrance Free Sensitive Skin Formula  Keri Lotion Fast Absorbing Softly Scented Dry Skin Formula  Keri Original Lotion  Keri Skin Renewal Lotion Keri Silky Smooth Lotion  Keri Silky Smooth Sensitive Skin Lotion  Nivea Body Creamy Conditioning Oil  Nivea Body Extra Enriched Lotion  Nivea Body Original Lotion  Nivea Body Sheer Moisturizing Lotion Nivea Crme  Nivea Skin Firming Lotion  NutraDerm 30 Skin Lotion  NutraDerm Skin Lotion  NutraDerm Therapeutic Skin Cream  NutraDerm Therapeutic Skin  Lotion  ProShield Protective Hand Cream  Provon moisturizing lotion   PATIENT SIGNATURE_________________________________  NURSE SIGNATURE__________________________________  ________________________________________________________________________    Rogelia Mire  An incentive spirometer is a tool that can help keep your lungs clear and active. This tool measures how well you are filling your lungs with each breath. Taking long deep breaths may help reverse or decrease the chance of developing breathing (pulmonary) problems (especially infection) following: A long period of time when you are unable to move or be active. BEFORE THE PROCEDURE  If the spirometer includes an indicator to show your best effort, your nurse or respiratory therapist will set it to a desired goal. If possible, sit up straight or lean slightly forward. Try not to slouch. Hold the incentive spirometer in an upright position. INSTRUCTIONS FOR USE  Sit on the edge of your bed if possible, or sit up as far as you can in bed or on a chair. Hold the incentive spirometer in an upright position. Breathe out normally. Place the mouthpiece in your mouth and seal  your lips tightly around it. Breathe in slowly and as deeply as possible, raising the piston or the ball toward the top of the column. Hold your breath for 3-5 seconds or for as long as possible. Allow the piston or ball to fall to the bottom of the column. Remove the mouthpiece from your mouth and breathe out normally. Rest for a few seconds and repeat Steps 1 through 7 at least 10 times every 1-2 hours when you are awake. Take your time and take a few normal breaths between deep breaths. The spirometer may include an indicator to show your best effort. Use the indicator as a goal to work toward during each repetition. After each set of 10 deep breaths, practice coughing to be sure your lungs are clear. If you have an incision (the cut made at the time of  surgery), support your incision when coughing by placing a pillow or rolled up towels firmly against it. Once you are able to get out of bed, walk around indoors and cough well. You may stop using the incentive spirometer when instructed by your caregiver.  RISKS AND COMPLICATIONS Take your time so you do not get dizzy or light-headed. If you are in pain, you may need to take or ask for pain medication before doing incentive spirometry. It is harder to take a deep breath if you are having pain. AFTER USE Rest and breathe slowly and easily. It can be helpful to keep track of a log of your progress. Your caregiver can provide you with a simple table to help with this. If you are using the spirometer at home, follow these instructions: SEEK MEDICAL CARE IF:  You are having difficultly using the spirometer. You have trouble using the spirometer as often as instructed. Your pain medication is not giving enough relief while using the spirometer. You develop fever of 100.5 F (38.1 C) or higher. SEEK IMMEDIATE MEDICAL CARE IF:  You cough up bloody sputum that had not been present before. You develop fever of 102 F (38.9 C) or greater. You develop worsening pain at or near the incision site. MAKE SURE YOU:  Understand these instructions. Will watch your condition. Will get help right away if you are not doing well or get worse. Document Released: 08/09/2006 Document Revised: 06/21/2011 Document Reviewed: 10/10/2006 Exit[employer] Patient Information 2014 [employer], [place].   ________________________________________________________________________ WHAT IS A BLOOD TRANSFUSION? Blood Transfusion Information  A transfusion is the replacement of blood or some of its parts. Blood is made up of multiple cells which provide different functions. Red blood cells carry oxygen and are used for blood loss replacement. White blood cells fight against infection. Platelets control bleeding. Plasma helps clot  blood. Other blood products are available for specialized needs, such as hemophilia or other clotting disorders. BEFORE THE TRANSFUSION  Who gives blood for transfusions?  Healthy volunteers who are fully evaluated to make sure their blood is safe. This is blood bank blood. Transfusion therapy is the safest it has ever been in the practice of medicine. Before blood is taken from a donor, a complete history is taken to make sure that person has no history of diseases nor engages in risky social behavior (examples are intravenous drug use or sexual activity with multiple partners). The donor's travel history is screened to minimize risk of transmitting infections, such as malaria. The donated blood is tested for signs of infectious diseases, such as HIV and hepatitis. The blood is then tested to be sure it is  compatible with you in order to minimize the chance of a transfusion reaction. If you or a relative donates blood, this is often done in anticipation of surgery and is not appropriate for emergency situations. It takes many days to process the donated blood. RISKS AND COMPLICATIONS Although transfusion therapy is very safe and saves many lives, the main dangers of transfusion include:  Getting an infectious disease. Developing a transfusion reaction. This is an allergic reaction to something in the blood you were given. Every precaution is taken to prevent this. The decision to have a blood transfusion has been considered carefully by your caregiver before blood is given. Blood is not given unless the benefits outweigh the risks. AFTER THE TRANSFUSION Right after receiving a blood transfusion, you will usually feel much better and more energetic. This is especially true if your red blood cells have gotten low (anemic). The transfusion raises the level of the red blood cells which carry oxygen, and this usually causes an energy increase. The nurse administering the transfusion will monitor you  carefully for complications. HOME CARE INSTRUCTIONS  No special instructions are needed after a transfusion. You may find your energy is better. Speak with your caregiver about any limitations on activity for underlying diseases you may have. SEEK MEDICAL CARE IF:  Your condition is not improving after your transfusion. You develop redness or irritation at the intravenous (IV) site. SEEK IMMEDIATE MEDICAL CARE IF:  Any of the following symptoms occur over the next 12 hours: Shaking chills. You have a temperature by mouth above 102 F (38.9 C), not controlled by medicine. Chest, back, or muscle pain. People around you feel you are not acting correctly or are confused. Shortness of breath or difficulty breathing. Dizziness and fainting. You get a rash or develop hives. You have a decrease in urine output. Your urine turns a dark color or changes to pink, red, or brown. Any of the following symptoms occur over the next 10 days: You have a temperature by mouth above 102 F (38.9 C), not controlled by medicine. Shortness of breath. Weakness after normal activity. The white part of the eye turns yellow (jaundice). You have a decrease in the amount of urine or are urinating less often. Your urine turns a dark color or changes to pink, red, or brown. Document Released: 03/26/2000 Document Revised: 06/21/2011 Document Reviewed: 11/13/2007 [facility] Patient Information 2014 [place], [place].  _______________________________________________________________________

## 2022-09-01 NOTE — Progress Notes (Addendum)
COVID Vaccine Completed:  Yes  Date of COVID positive in last 90 days:  No  PCP - Simone Curia, MD (office note on chart) Cardiologist - N/A  Medical clearance on chart  Clearance on chart from Dr. Danielle Dess dated 06-23-22  Chest x-ray - N/A EKG - 07-16-22 on chart Stress Test - N/A ECHO - N/A Cardiac Cath - N/A Pacemaker/ICD device last checked: Spinal Cord Stimulator:N/A  Bowel Prep - N/A  Sleep Study - N/A CPAP -   Prediabetes, CBG 111 at PAT Fasting Blood Sugar -  Checks Blood Sugar - does not check   Last dose of GLP1 agonist-  N/A GLP1 instructions:  N/A   Last dose of SGLT-2 inhibitors-  N/A SGLT-2 instructions: N/A  Blood Thinner Instructions: N/A Aspirin Instructions: Last Dose:  Activity level:  Can go up a flight of stairs and perform activities of daily living without stopping and without symptoms of chest pain or shortness of breath.  Able to exercise without symptoms, goes to the gym twice a week   Anesthesia review: N/A  Patient denies shortness of breath, fever, cough and chest pain at PAT appointment  Patient verbalized understanding of instructions that were given to them at the PAT appointment. Patient was also instructed that they will need to review over the PAT instructions again at home before surgery.

## 2022-09-03 ENCOUNTER — Other Ambulatory Visit: Payer: Self-pay

## 2022-09-03 ENCOUNTER — Encounter (HOSPITAL_COMMUNITY): Payer: Self-pay

## 2022-09-03 ENCOUNTER — Encounter (HOSPITAL_COMMUNITY)
Admission: RE | Admit: 2022-09-03 | Discharge: 2022-09-03 | Disposition: A | Discharge status: Home / Self Care | Payer: Medicare Other | Source: Ambulatory Visit | Attending: Orthopedic Surgery | Admitting: Orthopedic Surgery

## 2022-09-03 VITALS — BP 146/75 | HR 65 | Temp 98.4°F | Resp 16 | Ht 72.0 in | Wt 185.6 lb

## 2022-09-03 DIAGNOSIS — E119 Type 2 diabetes mellitus without complications: Secondary | ICD-10-CM | POA: Insufficient documentation

## 2022-09-03 DIAGNOSIS — I10 Essential (primary) hypertension: Secondary | ICD-10-CM | POA: Insufficient documentation

## 2022-09-03 DIAGNOSIS — Z01818 Encounter for other preprocedural examination: Secondary | ICD-10-CM | POA: Insufficient documentation

## 2022-09-03 HISTORY — DX: Testicular hypofunction: E29.1

## 2022-09-03 HISTORY — DX: Male erectile dysfunction, unspecified: N52.9

## 2022-09-03 HISTORY — DX: Type 2 diabetes mellitus without complications: E11.9

## 2022-09-03 HISTORY — DX: Anxiety disorder, unspecified: F41.9

## 2022-09-03 HISTORY — DX: Depression, unspecified: F32.A

## 2022-09-03 HISTORY — DX: Benign prostatic hyperplasia without lower urinary tract symptoms: N40.0

## 2022-09-03 HISTORY — DX: Prediabetes: R73.03

## 2022-09-03 LAB — CBC
HCT: 40.1 % (ref 39.0–52.0)
Hemoglobin: 13.1 g/dL (ref 13.0–17.0)
MCH: 30.5 pg (ref 26.0–34.0)
MCHC: 32.7 g/dL (ref 30.0–36.0)
MCV: 93.5 fL (ref 80.0–100.0)
Platelets: 237 10*3/uL (ref 150–400)
RBC: 4.29 MIL/uL (ref 4.22–5.81)
RDW: 13.5 % (ref 11.5–15.5)
WBC: 7.9 10*3/uL (ref 4.0–10.5)
nRBC: 0 % (ref 0.0–0.2)

## 2022-09-03 LAB — BASIC METABOLIC PANEL
Anion gap: 10 (ref 5–15)
BUN: 27 mg/dL — ABNORMAL HIGH (ref 8–23)
CO2: 19 mmol/L — ABNORMAL LOW (ref 22–32)
Calcium: 8.3 mg/dL — ABNORMAL LOW (ref 8.9–10.3)
Chloride: 104 mmol/L (ref 98–111)
Creatinine, Ser: 1.41 mg/dL — ABNORMAL HIGH (ref 0.61–1.24)
GFR, Estimated: 51 mL/min — ABNORMAL LOW (ref >60)
Glucose, Bld: 111 mg/dL — ABNORMAL HIGH (ref 70–99)
Potassium: 4.6 mmol/L (ref 3.5–5.1)
Sodium: 133 mmol/L — ABNORMAL LOW (ref 135–145)

## 2022-09-03 LAB — SURGICAL PCR SCREEN
MRSA, PCR: NEGATIVE
Staphylococcus aureus: NEGATIVE

## 2022-09-03 LAB — TYPE AND SCREEN: Antibody Screen: NEGATIVE

## 2022-09-03 LAB — GLUCOSE, CAPILLARY: Glucose-Capillary: 111 mg/dL — ABNORMAL HIGH (ref 70–99)

## 2022-09-04 LAB — TYPE AND SCREEN: ABO/RH(D): A POS

## 2022-09-04 LAB — HEMOGLOBIN A1C
Hgb A1c MFr Bld: 6.1 % — ABNORMAL HIGH (ref 4.8–5.6)
Mean Plasma Glucose: 128 mg/dL

## 2022-09-13 ENCOUNTER — Ambulatory Visit: Payer: Self-pay | Admitting: Student

## 2022-09-13 NOTE — H&P (View-Only) (Signed)
TOTAL HIP ADMISSION H&P  Patient is admitted for left total hip arthroplasty.  Subjective:  Chief Complaint: left hip pain  HPI: Daniel Wagner, 82 y.o. male, has a history of pain and functional disability in the left hip(s) due to arthritis and patient has failed non-surgical conservative treatments for greater than 12 weeks to include NSAID's and/or analgesics, flexibility and strengthening excercises, use of assistive devices, and activity modification.  Onset of symptoms was gradual starting 10 years ago with rapidlly worsening course since that time.The patient noted no past surgery on the left hip(s).  Patient currently rates pain in the left hip at 10 out of 10 with activity. Patient has night pain, worsening of pain with activity and weight bearing, trendelenberg gait, pain that interfers with activities of daily living, and pain with passive range of motion. Patient has evidence of subchondral cysts, subchondral sclerosis, periarticular osteophytes, and joint space narrowing by imaging studies. This condition presents safety issues increasing the risk of falls.  There is no current active infection.  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) 06/03/2022   Neck pain 07/08/2021   Pain in right arm 07/08/2021   Thoracic spondylosis with myelopathy 07/08/2021   Degeneration of lumbar intervertebral disc 07/08/2021   Osteoarthritis of ankle and foot 01/28/2021   Status post laminectomy 06/26/2020   Herniated nucleus pulposus with myelopathy, thoracic 06/26/2020   Acute recurrent ethmoidal sinusitis 11/21/2018   Allergy to pollen 11/21/2018   Subcutaneous mass of back 07/10/2018   Spinal stenosis of thoracic region 05/11/2018   Idiopathic peripheral neuropathy 03/13/2018   Pain in thoracic spine 02/08/2018   Charcot arthropathy of midfoot 08/10/2017   Chronic foot pain, right 03/10/2017   Closed  displaced fracture of second metatarsal bone of right foot with nonunion 03/10/2017   Closed displaced fracture of third metatarsal bone of right foot with nonunion 03/10/2017   Lumbar spondylosis 01/19/2017   Herniated nucleus pulposus, thoracic 01/19/2017   Chronic thoracic back pain 12/30/2016   Nausea 12/30/2016   Acquired hammer toe of right foot 07/27/2016   Metatarsalgia of right foot 07/27/2016   Closed fracture of base of metatarsal bone of right foot, with nonunion, subsequent encounter 04/04/2016   Gas pain 03/01/2016   Bloating 03/01/2016   Abdominal pain, epigastric 03/01/2016   Gastroesophageal reflux disease 03/01/2016   Muscle pain 12/24/2015   Stress fracture of metatarsal bone of right foot 11/04/2015   Cervical radiculitis 10/24/2014   S/P lumbar discectomy 06/27/2014   Lumbar stenosis with neurogenic claudication 03/19/2014   Spinal stenosis of lumbar region 03/06/2014   Acute low back pain 04/19/2013   Calculus of kidney 01/28/2013   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   History of kidney stones 03/2013   Lithotrispy   Hyperlipidemia    Hypertension  Hypogonadism in male    Leg pain    While Walking   Pneumonia    Pre-diabetes    Sinus problem     Past Surgical History:  Procedure Laterality Date   ABDOMINAL EXPOSURE N/A 07/30/2021   Procedure: ABDOMINAL EXPOSURE;  Surgeon: Leonie Douglas, MD;  Location: [facility] OR;  Service: Vascular;  Laterality: N/A;   ANTERIOR  CERVICAL DECOMPRESSION/DISCECTOMY FUSION 4 LEVELS N/A 06/30/2012   Procedure: ANTERIOR CERVICAL DECOMPRESSION/DISCECTOMY FUSION 4 LEVELS;  Surgeon: Barnett Abu, MD;  Location: [facility];  Service: Neurosurgery;  Laterality: N/A;  C3-4 C4-5 C5-6 C6-7 Anterior cervical decompression/diskectomy/fusion   ANTERIOR LUMBAR FUSION N/A 07/30/2021   Procedure: LUMBAR FIVE-SACRAL ONE ANTERIOR LUMBAR INTERBODY FUSION;  Surgeon: Barnett Abu, MD;  Location: [facility] OR;  Service: Neurosurgery;  Laterality: N/A;   APPLICATION OF ROBOTIC ASSISTANCE FOR SPINAL PROCEDURE N/A 06/26/2020   Procedure: APPLICATION OF ROBOTIC ASSISTANCE FOR SPINAL PROCEDURE;  Surgeon: Barnett Abu, MD;  Location: [facility] OR;  Service: Neurosurgery;  Laterality: N/A;   BACK SURGERY  08/2010   lumbar fusion   CATARACT EXTRACTION, BILATERAL     CHOLECYSTECTOMY  1990's   COLONOSCOPY  02/04/2015   NECK SURGERY     x2   sinus surgeries     x2   SKIN CANCER EXCISION     arm chest and head basal cell   TRANSFORAMINAL LUMBAR INTERBODY FUSION (TLIF) WITH PEDICLE SCREW FIXATION 1 LEVEL N/A 06/03/2022   Procedure: T8-9 TLIF;  Surgeon: Barnett Abu, MD;  Location: [facility] OR;  Service: Neurosurgery;  Laterality: N/A;  RM 20 3C   TRANSURETHRAL RESECTION OF PROSTATE  1999    Current Outpatient Medications  Medication Sig Dispense Refill Last Dose   acetaminophen (TYLENOL) 500 MG tablet Take 1,000 mg by mouth every 6 (six) hours as needed for moderate pain.      ascorbic acid (VITAMIN C) 500 MG tablet Take 500 mg by mouth 2 (two) times daily.      b complex vitamins capsule Take 1 capsule by mouth 2 (two) times daily.      Calcium Carbonate (CALCIUM 500 PO) Take 500 mg by mouth daily.      cetirizine (ZYRTEC) 10 MG tablet Take 10 mg by mouth daily as needed for allergies.      Coenzyme Q10 (COQ10) 100 MG CAPS Take 100 mg by mouth daily.      Cranberry 4200 MG CAPS Take 4,200 mg by mouth daily.      diclofenac Sodium (VOLTAREN) 1 % GEL Apply 1  application. topically daily.      dicyclomine (BENTYL) 10 MG capsule Take 10 mg by mouth 2 (two) times daily as needed for spasms.      diphenoxylate-atropine (LOMOTIL) 2.5-0.025 MG tablet Take 1 tablet by mouth 4 (four) times daily as needed for diarrhea or loose stools.      doxazosin (CARDURA) 4 MG tablet Take 4 mg by mouth daily.       Echinacea 400 MG CAPS Take 400 mg by mouth daily.      esomeprazole (NEXIUM) 20 MG capsule Take 20 mg by mouth daily with breakfast.      Flaxseed, Linseed, (FLAX SEED OIL) 1000 MG CAPS Take 1,000 mg by mouth daily.      fluticasone (FLONASE) 50 MCG/ACT nasal spray Place 2 sprays into both nostrils daily as needed for allergies.      gabapentin (NEURONTIN) 300 MG capsule Take 300-600 mg by mouth See admin instructions. Take 300  mg in the morning and 600 mg at night      Garlic 1000 MG CAPS Take 1,000 mg by mouth 2 (two) times daily.      GLUCOSAMINE-CHONDROITIN PO Take 1 tablet by mouth daily.      guaifenesin (HUMIBID E) 400 MG TABS tablet Take 400 mg by mouth 3 (three) times daily as needed (allergies).      HYDROcodone-acetaminophen (NORCO) 7.5-325 MG tablet Take 1 tablet by mouth in the morning and at bedtime.      hydrocortisone (ANUSOL-HC) 2.5 % rectal cream Apply [phone] Applications topically 2 (two) times daily as needed for hemorrhoids.      lisinopril (PRINIVIL,ZESTRIL) 20 MG tablet Take 20 mg by mouth every evening.       lovastatin (MEVACOR) 40 MG tablet Take 40 mg by mouth daily.      Magnesium 250 MG TABS Take 250 mg by mouth 2 (two) times daily.      meloxicam (MOBIC) 7.5 MG tablet Take 7.5 mg by mouth 2 (two) times daily.      methocarbamol (ROBAXIN) 500 MG tablet Take 1 tablet (500 mg total) by mouth every 6 (six) hours as needed for muscle spasms. 40 tablet 3    niacin 500 MG tablet Take 500 mg by mouth daily.      OVER THE COUNTER MEDICATION Take 1 capsule by mouth 2 (two) times daily. Ju[employer] supplement      Polyethyl  Glycol-Propyl Glycol 0.4-0.3 % SOLN Apply 1 drop to eye at bedtime as needed (dry eyes).      Potassium 99 MG TABS Take 99 mg by mouth 2 (two) times daily.      Probiotic Product (PRO-BIOTIC BLEND PO) Take 1 capsule by mouth daily.      pyridOXINE (VITAMIN B6) 100 MG tablet Take 100 mg by mouth daily.      Simethicone (GAS-X PO) Take 1 capsule by mouth 3 (three) times daily as needed (bloating). Uses [employer] brand      sulfamethoxazole-trimethoprim (BACTRIM DS) 800-160 MG tablet Take 1 tablet by mouth 2 (two) times daily.      testosterone cypionate (DEPOTESTOSTERONE CYPIONATE) 200 MG/ML injection Inject 200 mg into the muscle every 28 (twenty-eight) days. For IM use only      TURMERIC PO Take 800 mg by mouth 2 (two) times daily.      vitamin B-12 (CYANOCOBALAMIN) 500 MCG tablet Take 500 mcg by mouth 2 (two) times daily.      Vitamin D, Cholecalciferol, 10 MCG (400 UNIT) CAPS Take 400 Units by mouth daily.      Vitamin E 268 MG (400 UNIT) CAPS Take 400 Units by mouth daily.      zinc gluconate 50 MG tablet Take 50 mg by mouth 2 (two) times daily.      zolpidem (AMBIEN) 10 MG tablet Take 5 mg by mouth at bedtime.      No current facility-administered medications for this visit.   Allergies  Allergen Reactions   Aspirin Other (See Comments)    Jittery if taking large quantities    Social History   Tobacco Use   Smoking status: Never   Smokeless tobacco: Never  Substance Use Topics   Alcohol use: Yes    Alcohol/week: 7.0 standard drinks of alcohol    Types: 7 Cans of beer per week    Family History  Problem Relation Age of Onset   Stroke Mother    Heart disease Mother    Emphysema Father  Heart disease Sister    Tongue cancer Brother    Heart disease Brother    Colon cancer Neg Hx    Colon polyps Neg Hx    Esophageal cancer Neg Hx    Rectal cancer Neg Hx    Stomach cancer Neg Hx      Review of Systems  Musculoskeletal:  Positive for arthralgias and gait problem.  All  other systems reviewed and are negative.   Objective:  Physical Exam Constitutional:      Appearance: Normal appearance.  HENT:     Head: Normocephalic and atraumatic.     Nose: Nose normal.     Mouth/Throat:     Mouth: Mucous membranes are moist.     Pharynx: Oropharynx is clear.  Eyes:     Conjunctiva/sclera: Conjunctivae normal.  Cardiovascular:     Rate and Rhythm: Normal rate and regular rhythm.     Pulses: Normal pulses.     Heart sounds: Normal heart sounds.  Pulmonary:     Effort: Pulmonary effort is normal.     Breath sounds: Normal breath sounds.  Abdominal:     General: Abdomen is flat.     Palpations: Abdomen is soft.  Genitourinary:    Comments: deferred Musculoskeletal:     Cervical back: Normal range of motion and neck supple.     Comments: Patient is alert and oriented x3. No acute distress.   Examination of the left hip reveals no skin wounds or lesions. Pain with flexion and rotation. No significant restriction of hip motion. Mild trochanteric tenderness to palpation. Hip abductor strength 4/5.  Pain with palpation over SI joint bilaterally with left worse than right.   Neurovascularly intact distally   Skin:    General: Skin is warm and dry.     Capillary Refill: Capillary refill takes less than 2 seconds.  Neurological:     General: No focal deficit present.     Mental Status: He is alert and oriented to person, place, and time.  Psychiatric:        Mood and Affect: Mood normal.        Behavior: Behavior normal.        Thought Content: Thought content normal.        Judgment: Judgment normal.     Vital signs in last 24 hours: @VSRANGES @  Labs:   Estimated body mass index is 25.17 kg/m as calculated from the following:   Height as of 09/03/22: 6' (1.829 m).   Weight as of 09/03/22: 84.2 kg.   Imaging Review Plain radiographs demonstrate severe degenerative joint disease of the left hip(s). The bone quality appears to be adequate for  age and reported activity level.      Assessment/Plan:  End stage arthritis, left hip(s)  The patient history, physical examination, clinical judgement of the provider and imaging studies are consistent with end stage degenerative joint disease of the left hip(s) and total hip arthroplasty is deemed medically necessary. The treatment options including medical management, injection therapy, arthroscopy and arthroplasty were discussed at length. The risks and benefits of total hip arthroplasty were presented and reviewed. The risks due to aseptic loosening, infection, stiffness, dislocation/subluxation,  thromboembolic complications and other imponderables were discussed.  The patient acknowledged the explanation, agreed to proceed with the plan and consent was signed. Patient is being admitted for inpatient treatment for surgery, pain control, PT, OT, prophylactic antibiotics, VTE prophylaxis, progressive ambulation and ADL's and discharge planning.The patient is planning to be discharged  home with HEP.    Therapy Plans: HEP. Disposition: Home with wife Planned DVT Prophylaxis: aspirin 81mg  BID DME needed: Has rolling walker.  PCP: Cleared. Dr. Danielle Dess Spine: Cleared.  TXA: IV  Allergies:  - Aspirin - jittery in large quantities.  - Doxycycline - abd pain and diarrhea.  - Avelox - nervousness and difficulty sleeping.  Anesthesia Concerns: None.  BMI: 26.1 Last HgbA1c: 6.1 Other: - Chronic back pain in thoracic and lumbar with fusions.  - Currently on hydrocodone from back surgery 7.5/325 BID. Discussed to discontinue medication prior to surgery to help reduce pain after surgery. Also discussed to not take other NSAIDs concurrently with meloxicam.  - Hydrocodone, zofran, has methocarbamol and meloxicam 7.5 BID.  - D/c NSAIDs.  - 09/03/22: Hgb 12.3, K+ 4.6, Cr. 1.41.    Patient's anticipated LOS is less than 2 midnights, meeting these requirements: - Younger than 32 - Lives within 1  hour of care - Has a competent adult at home to recover with post-op recover - NO history of  - Chronic pain requiring opiods  - Diabetes  - Coronary Artery Disease  - Heart failure  - Heart attack  - Stroke  - DVT/VTE  - Cardiac arrhythmia  - Respiratory Failure/COPD  - Renal failure  - Anemia  - Advanced Liver disease

## 2022-09-13 NOTE — H&P (Signed)
TOTAL HIP ADMISSION H&P  Patient is admitted for left total hip arthroplasty.  Subjective:  Chief Complaint: left hip pain  HPI: Daniel Wagner, 81 y.o. male, has a history of pain and functional disability in the left hip(s) due to arthritis and patient has failed non-surgical conservative treatments for greater than 12 weeks to include NSAID's and/or analgesics, flexibility and strengthening excercises, use of assistive devices, and activity modification.  Onset of symptoms was gradual starting 10 years ago with rapidlly worsening course since that time.The patient noted no past surgery on the left hip(s).  Patient currently rates pain in the left hip at 10 out of 10 with activity. Patient has night pain, worsening of pain with activity and weight bearing, trendelenberg gait, pain that interfers with activities of daily living, and pain with passive range of motion. Patient has evidence of subchondral cysts, subchondral sclerosis, periarticular osteophytes, and joint space narrowing by imaging studies. This condition presents safety issues increasing the risk of falls.  There is no current active infection.  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) 06/03/2022   Neck pain 07/08/2021   Pain in right arm 07/08/2021   Thoracic spondylosis with myelopathy 07/08/2021   Degeneration of lumbar intervertebral disc 07/08/2021   Osteoarthritis of ankle and foot 01/28/2021   Status post laminectomy 06/26/2020   Herniated nucleus pulposus with myelopathy, thoracic 06/26/2020   Acute recurrent ethmoidal sinusitis 11/21/2018   Allergy to pollen 11/21/2018   Subcutaneous mass of back 07/10/2018   Spinal stenosis of thoracic region 05/11/2018   Idiopathic peripheral neuropathy 03/13/2018   Pain in thoracic spine 02/08/2018   Charcot arthropathy of midfoot 08/10/2017   Chronic foot pain, right 03/10/2017   Closed  displaced fracture of second metatarsal bone of right foot with nonunion 03/10/2017   Closed displaced fracture of third metatarsal bone of right foot with nonunion 03/10/2017   Lumbar spondylosis 01/19/2017   Herniated nucleus pulposus, thoracic 01/19/2017   Chronic thoracic back pain 12/30/2016   Nausea 12/30/2016   Acquired hammer toe of right foot 07/27/2016   Metatarsalgia of right foot 07/27/2016   Closed fracture of base of metatarsal bone of right foot, with nonunion, subsequent encounter 04/04/2016   Gas pain 03/01/2016   Bloating 03/01/2016   Abdominal pain, epigastric 03/01/2016   Gastroesophageal reflux disease 03/01/2016   Muscle pain 12/24/2015   Stress fracture of metatarsal bone of right foot 11/04/2015   Cervical radiculitis 10/24/2014   S/P lumbar discectomy 06/27/2014   Lumbar stenosis with neurogenic claudication 03/19/2014   Spinal stenosis of lumbar region 03/06/2014   Acute low back pain 04/19/2013   Calculus of kidney 01/28/2013   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   History of kidney stones 03/2013   Lithotrispy   Hyperlipidemia    Hypertension      Hypogonadism in male    Leg pain    While Walking   Pneumonia    Pre-diabetes    Sinus problem     Past Surgical History:  Procedure Laterality Date   ABDOMINAL EXPOSURE N/A 07/30/2021   Procedure: ABDOMINAL EXPOSURE;  Surgeon: Hawken, Thomas N, MD;  Location: [facility] OR;  Service: Vascular;  Laterality: N/A;   ANTERIOR  CERVICAL DECOMPRESSION/DISCECTOMY FUSION 4 LEVELS N/A 06/30/2012   Procedure: ANTERIOR CERVICAL DECOMPRESSION/DISCECTOMY FUSION 4 LEVELS;  Surgeon: Henry Elsner, MD;  Location: [facility];  Service: Neurosurgery;  Laterality: N/A;  C3-4 C4-5 C5-6 C6-7 Anterior cervical decompression/diskectomy/fusion   ANTERIOR LUMBAR FUSION N/A 07/30/2021   Procedure: LUMBAR FIVE-SACRAL ONE ANTERIOR LUMBAR INTERBODY FUSION;  Surgeon: Elsner, Henry, MD;  Location: [facility] OR;  Service: Neurosurgery;  Laterality: N/A;   APPLICATION OF ROBOTIC ASSISTANCE FOR SPINAL PROCEDURE N/A 06/26/2020   Procedure: APPLICATION OF ROBOTIC ASSISTANCE FOR SPINAL PROCEDURE;  Surgeon: Elsner, Henry, MD;  Location: [facility] OR;  Service: Neurosurgery;  Laterality: N/A;   BACK SURGERY  08/2010   lumbar fusion   CATARACT EXTRACTION, BILATERAL     CHOLECYSTECTOMY  1990's   COLONOSCOPY  02/04/2015   NECK SURGERY     x2   sinus surgeries     x2   SKIN CANCER EXCISION     arm chest and head basal cell   TRANSFORAMINAL LUMBAR INTERBODY FUSION (TLIF) WITH PEDICLE SCREW FIXATION 1 LEVEL N/A 06/03/2022   Procedure: T8-9 TLIF;  Surgeon: Elsner, Henry, MD;  Location: [facility] OR;  Service: Neurosurgery;  Laterality: N/A;  RM 20 3C   TRANSURETHRAL RESECTION OF PROSTATE  1999    Current Outpatient Medications  Medication Sig Dispense Refill Last Dose   acetaminophen (TYLENOL) 500 MG tablet Take 1,000 mg by mouth every 6 (six) hours as needed for moderate pain.      ascorbic acid (VITAMIN C) 500 MG tablet Take 500 mg by mouth 2 (two) times daily.      b complex vitamins capsule Take 1 capsule by mouth 2 (two) times daily.      Calcium Carbonate (CALCIUM 500 PO) Take 500 mg by mouth daily.      cetirizine (ZYRTEC) 10 MG tablet Take 10 mg by mouth daily as needed for allergies.      Coenzyme Q10 (COQ10) 100 MG CAPS Take 100 mg by mouth daily.      Cranberry 4200 MG CAPS Take 4,200 mg by mouth daily.      diclofenac Sodium (VOLTAREN) 1 % GEL Apply 1  application. topically daily.      dicyclomine (BENTYL) 10 MG capsule Take 10 mg by mouth 2 (two) times daily as needed for spasms.      diphenoxylate-atropine (LOMOTIL) 2.5-0.025 MG tablet Take 1 tablet by mouth 4 (four) times daily as needed for diarrhea or loose stools.      doxazosin (CARDURA) 4 MG tablet Take 4 mg by mouth daily.       Echinacea 400 MG CAPS Take 400 mg by mouth daily.      esomeprazole (NEXIUM) 20 MG capsule Take 20 mg by mouth daily with breakfast.      Flaxseed, Linseed, (FLAX SEED OIL) 1000 MG CAPS Take 1,000 mg by mouth daily.      fluticasone (FLONASE) 50 MCG/ACT nasal spray Place 2 sprays into both nostrils daily as needed for allergies.      gabapentin (NEURONTIN) 300 MG capsule Take 300-600 mg by mouth See admin instructions. Take 300   mg in the morning and 600 mg at night      Garlic 1000 MG CAPS Take 1,000 mg by mouth 2 (two) times daily.      GLUCOSAMINE-CHONDROITIN PO Take 1 tablet by mouth daily.      guaifenesin (HUMIBID E) 400 MG TABS tablet Take 400 mg by mouth 3 (three) times daily as needed (allergies).      HYDROcodone-acetaminophen (NORCO) 7.5-325 MG tablet Take 1 tablet by mouth in the morning and at bedtime.      hydrocortisone (ANUSOL-HC) 2.5 % rectal cream Apply [id number] Applications topically 2 (two) times daily as needed for hemorrhoids.      lisinopril (PRINIVIL,ZESTRIL) 20 MG tablet Take 20 mg by mouth every evening.       lovastatin (MEVACOR) 40 MG tablet Take 40 mg by mouth daily.      Magnesium 250 MG TABS Take 250 mg by mouth 2 (two) times daily.      meloxicam (MOBIC) 7.5 MG tablet Take 7.5 mg by mouth 2 (two) times daily.      methocarbamol (ROBAXIN) 500 MG tablet Take 1 tablet (500 mg total) by mouth every 6 (six) hours as needed for muscle spasms. 40 tablet 3    niacin 500 MG tablet Take 500 mg by mouth daily.      OVER THE COUNTER MEDICATION Take 1 capsule by mouth 2 (two) times daily. Juice Festiv supplement      Polyethyl  Glycol-Propyl Glycol 0.4-0.3 % SOLN Apply 1 drop to eye at bedtime as needed (dry eyes).      Potassium 99 MG TABS Take 99 mg by mouth 2 (two) times daily.      Probiotic Product (PRO-BIOTIC BLEND PO) Take 1 capsule by mouth daily.      pyridOXINE (VITAMIN B6) 100 MG tablet Take 100 mg by mouth daily.      Simethicone (GAS-X PO) Take 1 capsule by mouth 3 (three) times daily as needed (bloating). Uses [employer] brand      sulfamethoxazole-trimethoprim (BACTRIM DS) 800-160 MG tablet Take 1 tablet by mouth 2 (two) times daily.      testosterone cypionate (DEPOTESTOSTERONE CYPIONATE) 200 MG/ML injection Inject 200 mg into the muscle every 28 (twenty-eight) days. For IM use only      TURMERIC PO Take 800 mg by mouth 2 (two) times daily.      vitamin B-12 (CYANOCOBALAMIN) 500 MCG tablet Take 500 mcg by mouth 2 (two) times daily.      Vitamin D, Cholecalciferol, 10 MCG (400 UNIT) CAPS Take 400 Units by mouth daily.      Vitamin E 268 MG (400 UNIT) CAPS Take 400 Units by mouth daily.      zinc gluconate 50 MG tablet Take 50 mg by mouth 2 (two) times daily.      zolpidem (AMBIEN) 10 MG tablet Take 5 mg by mouth at bedtime.      No current facility-administered medications for this visit.   Allergies  Allergen Reactions   Aspirin Other (See Comments)    Jittery if taking large quantities    Social History   Tobacco Use   Smoking status: Never   Smokeless tobacco: Never  Substance Use Topics   Alcohol use: Yes    Alcohol/week: 7.0 standard drinks of alcohol    Types: 7 Cans of beer per week    Family History  Problem Relation Age of Onset   Stroke Mother    Heart disease Mother    Emphysema Father      Heart disease Sister    Tongue cancer Brother    Heart disease Brother    Colon cancer Neg Hx    Colon polyps Neg Hx    Esophageal cancer Neg Hx    Rectal cancer Neg Hx    Stomach cancer Neg Hx      Review of Systems  Musculoskeletal:  Positive for arthralgias and gait problem.  All  other systems reviewed and are negative.   Objective:  Physical Exam Constitutional:      Appearance: Normal appearance.  HENT:     Head: Normocephalic and atraumatic.     Nose: Nose normal.     Mouth/Throat:     Mouth: Mucous membranes are moist.     Pharynx: Oropharynx is clear.  Eyes:     Conjunctiva/sclera: Conjunctivae normal.  Cardiovascular:     Rate and Rhythm: Normal rate and regular rhythm.     Pulses: Normal pulses.     Heart sounds: Normal heart sounds.  Pulmonary:     Effort: Pulmonary effort is normal.     Breath sounds: Normal breath sounds.  Abdominal:     General: Abdomen is flat.     Palpations: Abdomen is soft.  Genitourinary:    Comments: deferred Musculoskeletal:     Cervical back: Normal range of motion and neck supple.     Comments: Patient is alert and oriented x3. No acute distress.   Examination of the left hip reveals no skin wounds or lesions. Pain with flexion and rotation. No significant restriction of hip motion. Mild trochanteric tenderness to palpation. Hip abductor strength 4/5.  Pain with palpation over SI joint bilaterally with left worse than right.   Neurovascularly intact distally   Skin:    General: Skin is warm and dry.     Capillary Refill: Capillary refill takes less than 2 seconds.  Neurological:     General: No focal deficit present.     Mental Status: He is alert and oriented to person, place, and time.  Psychiatric:        Mood and Affect: Mood normal.        Behavior: Behavior normal.        Thought Content: Thought content normal.        Judgment: Judgment normal.     Vital signs in last 24 hours: @VSRANGES@  Labs:   Estimated body mass index is 25.17 kg/m as calculated from the following:   Height as of 09/03/22: 6' (1.829 m).   Weight as of 09/03/22: 84.2 kg.   Imaging Review Plain radiographs demonstrate severe degenerative joint disease of the left hip(s). The bone quality appears to be adequate for  age and reported activity level.      Assessment/Plan:  End stage arthritis, left hip(s)  The patient history, physical examination, clinical judgement of the provider and imaging studies are consistent with end stage degenerative joint disease of the left hip(s) and total hip arthroplasty is deemed medically necessary. The treatment options including medical management, injection therapy, arthroscopy and arthroplasty were discussed at length. The risks and benefits of total hip arthroplasty were presented and reviewed. The risks due to aseptic loosening, infection, stiffness, dislocation/subluxation,  thromboembolic complications and other imponderables were discussed.  The patient acknowledged the explanation, agreed to proceed with the plan and consent was signed. Patient is being admitted for inpatient treatment for surgery, pain control, PT, OT, prophylactic antibiotics, VTE prophylaxis, progressive ambulation and ADL's and discharge planning.The patient is planning to be discharged   home with HEP.    Therapy Plans: HEP. Disposition: Home with wife Planned DVT Prophylaxis: aspirin 81mg BID DME needed: Has rolling walker.  PCP: Cleared. Dr. Elsner Spine: Cleared.  TXA: IV  Allergies:  - Aspirin - jittery in large quantities.  - Doxycycline - abd pain and diarrhea.  - Avelox - nervousness and difficulty sleeping.  Anesthesia Concerns: None.  BMI: 26.1 Last HgbA1c: 6.1 Other: - Chronic back pain in thoracic and lumbar with fusions.  - Currently on hydrocodone from back surgery 7.5/325 BID. Discussed to discontinue medication prior to surgery to help reduce pain after surgery. Also discussed to not take other NSAIDs concurrently with meloxicam.  - Hydrocodone, zofran, has methocarbamol and meloxicam 7.5 BID.  - D/c NSAIDs.  - 09/03/22: Hgb 12.3, K+ 4.6, Cr. 1.41.    Patient's anticipated LOS is less than 2 midnights, meeting these requirements: - Younger than 65 - Lives within 1  hour of care - Has a competent adult at home to recover with post-op recover - NO history of  - Chronic pain requiring opiods  - Diabetes  - Coronary Artery Disease  - Heart failure  - Heart attack  - Stroke  - DVT/VTE  - Cardiac arrhythmia  - Respiratory Failure/COPD  - Renal failure  - Anemia  - Advanced Liver disease   

## 2022-09-14 NOTE — Anesthesia Preprocedure Evaluation (Signed)
Anesthesia Evaluation  Patient identified by MRN, date of birth, ID band Patient awake    Reviewed: Allergy & Precautions, NPO status , Patient's Chart, lab work & pertinent test results  History of Anesthesia Complications Negative for: history of anesthetic complications  Airway Mallampati: II  TM Distance: >3 FB Neck ROM: Full    Dental  (+) Dental Advisory Given   Pulmonary neg pulmonary ROS   breath sounds clear to auscultation       Cardiovascular hypertension, Pt. on medications (-) angina  Rhythm:Regular Rate:Normal     Neuro/Psych   Anxiety Depression    S/p multiple back fusions    GI/Hepatic Neg liver ROS,GERD  Medicated and Controlled,,  Endo/Other  diabetes (glu 100, diet controlled)    Renal/GU Renal InsufficiencyRenal disease     Musculoskeletal  (+) Arthritis ,    Abdominal   Peds  Hematology negative hematology ROS (+)   Anesthesia Other Findings   Reproductive/Obstetrics                             Anesthesia Physical Anesthesia Plan  ASA: 3  Anesthesia Plan: Spinal   Post-op Pain Management: Tylenol PO (pre-op)*   Induction:   PONV Risk Score and Plan: 1 and Ondansetron and Treatment may vary due to age or medical condition  Airway Management Planned: Natural Airway and Simple Face Mask  Additional Equipment:   Intra-op Plan:   Post-operative Plan:   Informed Consent: I have reviewed the patients History and Physical, chart, labs and discussed the procedure including the risks, benefits and alternatives for the proposed anesthesia with the patient or authorized representative who has indicated his/her understanding and acceptance.     Dental advisory given  Plan Discussed with: CRNA and Surgeon  Anesthesia Plan Comments:        Anesthesia Quick Evaluation

## 2022-09-15 ENCOUNTER — Encounter (HOSPITAL_COMMUNITY): Payer: Self-pay | Admitting: Orthopedic Surgery

## 2022-09-15 ENCOUNTER — Ambulatory Visit (HOSPITAL_COMMUNITY)
Admission: RE | Admit: 2022-09-15 | Discharge: 2022-09-15 | Disposition: A | Discharge status: Home / Self Care | Payer: Medicare Other | Source: Ambulatory Visit | Attending: Orthopedic Surgery | Admitting: Orthopedic Surgery

## 2022-09-15 ENCOUNTER — Other Ambulatory Visit (HOSPITAL_COMMUNITY): Payer: Self-pay

## 2022-09-15 ENCOUNTER — Other Ambulatory Visit: Payer: Self-pay

## 2022-09-15 ENCOUNTER — Encounter (HOSPITAL_COMMUNITY)
Admission: RE | Disposition: A | Discharge status: Home / Self Care | Payer: Self-pay | Source: Ambulatory Visit | Attending: Orthopedic Surgery

## 2022-09-15 ENCOUNTER — Ambulatory Visit (HOSPITAL_BASED_OUTPATIENT_CLINIC_OR_DEPARTMENT_OTHER): Payer: Medicare Other | Admitting: Anesthesiology

## 2022-09-15 ENCOUNTER — Ambulatory Visit (HOSPITAL_COMMUNITY): Payer: Medicare Other | Admitting: Anesthesiology

## 2022-09-15 ENCOUNTER — Ambulatory Visit (HOSPITAL_COMMUNITY): Payer: Medicare Other

## 2022-09-15 DIAGNOSIS — E119 Type 2 diabetes mellitus without complications: Secondary | ICD-10-CM

## 2022-09-15 DIAGNOSIS — M1612 Unilateral primary osteoarthritis, left hip: Secondary | ICD-10-CM

## 2022-09-15 DIAGNOSIS — F419 Anxiety disorder, unspecified: Secondary | ICD-10-CM | POA: Insufficient documentation

## 2022-09-15 DIAGNOSIS — K219 Gastro-esophageal reflux disease without esophagitis: Secondary | ICD-10-CM | POA: Diagnosis not present

## 2022-09-15 DIAGNOSIS — Z85828 Personal history of other malignant neoplasm of skin: Secondary | ICD-10-CM | POA: Insufficient documentation

## 2022-09-15 DIAGNOSIS — E785 Hyperlipidemia, unspecified: Secondary | ICD-10-CM | POA: Diagnosis not present

## 2022-09-15 DIAGNOSIS — F418 Other specified anxiety disorders: Secondary | ICD-10-CM | POA: Diagnosis not present

## 2022-09-15 DIAGNOSIS — F32A Depression, unspecified: Secondary | ICD-10-CM | POA: Diagnosis not present

## 2022-09-15 DIAGNOSIS — Z79899 Other long term (current) drug therapy: Secondary | ICD-10-CM | POA: Diagnosis not present

## 2022-09-15 DIAGNOSIS — I10 Essential (primary) hypertension: Secondary | ICD-10-CM

## 2022-09-15 HISTORY — PX: TOTAL HIP ARTHROPLASTY: SHX124

## 2022-09-15 LAB — GLUCOSE, CAPILLARY
Glucose-Capillary: 100 mg/dL — ABNORMAL HIGH (ref 70–99)
Glucose-Capillary: 105 mg/dL — ABNORMAL HIGH (ref 70–99)

## 2022-09-15 SURGERY — ARTHROPLASTY, HIP, TOTAL, ANTERIOR APPROACH
Anesthesia: Spinal | Site: Hip | Laterality: Left

## 2022-09-15 MED ORDER — ACETAMINOPHEN 500 MG PO TABS
1000.0000 mg | ORAL_TABLET | Freq: Once | ORAL | Status: AC
  Administered 2022-09-15: 1000 mg via ORAL
  Filled 2022-09-15: qty 2

## 2022-09-15 MED ORDER — KETOROLAC TROMETHAMINE 30 MG/ML IJ SOLN
INTRAMUSCULAR | Status: AC
  Filled 2022-09-15: qty 1

## 2022-09-15 MED ORDER — MIDAZOLAM HCL 2 MG/2ML IJ SOLN: 0.5000 mg | Freq: Once | INTRAMUSCULAR | Status: DC | PRN

## 2022-09-15 MED ORDER — PRONTOSAN WOUND IRRIGATION OPTIME
TOPICAL | Status: DC | PRN
  Administered 2022-09-15: 450 mL via TOPICAL

## 2022-09-15 MED ORDER — OXYCODONE HCL 5 MG PO TABS
ORAL_TABLET | ORAL | Status: AC
  Filled 2022-09-15: qty 1

## 2022-09-15 MED ORDER — DEXAMETHASONE SODIUM PHOSPHATE 10 MG/ML IJ SOLN
INTRAMUSCULAR | Status: DC | PRN
  Administered 2022-09-15: 8 mg via INTRAVENOUS

## 2022-09-15 MED ORDER — ORAL CARE MOUTH RINSE: 15.0000 mL | Freq: Once | OROMUCOSAL | Status: AC

## 2022-09-15 MED ORDER — CEFAZOLIN SODIUM-DEXTROSE 2-4 GM/100ML-% IV SOLN
2.0000 g | INTRAVENOUS | Status: AC
  Administered 2022-09-15: 2 g via INTRAVENOUS
  Filled 2022-09-15: qty 100

## 2022-09-15 MED ORDER — METHOCARBAMOL 500 MG PO TABS: 500.0000 mg | ORAL_TABLET | Freq: Four times a day (QID) | ORAL | Status: DC | PRN

## 2022-09-15 MED ORDER — CEFAZOLIN SODIUM-DEXTROSE 2-4 GM/100ML-% IV SOLN: 2.0000 g | Freq: Four times a day (QID) | INTRAVENOUS | Status: DC

## 2022-09-15 MED ORDER — OXYCODONE HCL 5 MG/5ML PO SOLN: 5.0000 mg | Freq: Once | ORAL | Status: AC | PRN

## 2022-09-15 MED ORDER — POLYETHYLENE GLYCOL 3350 17 GM/SCOOP PO POWD
17.0000 g | Freq: Every day | ORAL | 0 refills | Status: AC | PRN
  Filled 2022-09-15: qty 238, 14d supply, fill #0

## 2022-09-15 MED ORDER — HYDROCODONE-ACETAMINOPHEN 10-325 MG PO TABS
1.0000 | ORAL_TABLET | ORAL | 0 refills | Status: AC | PRN
  Filled 2022-09-15: qty 42, 7d supply, fill #0

## 2022-09-15 MED ORDER — PHENYLEPHRINE HCL-NACL 20-0.9 MG/250ML-% IV SOLN
INTRAVENOUS | Status: DC | PRN
  Administered 2022-09-15: 40 ug/min via INTRAVENOUS

## 2022-09-15 MED ORDER — METHOCARBAMOL 500 MG IVPB - SIMPLE MED
INTRAVENOUS | Status: AC
  Filled 2022-09-15: qty 55

## 2022-09-15 MED ORDER — ACETAMINOPHEN 500 MG PO TABS
500.0000 mg | ORAL_TABLET | Freq: Four times a day (QID) | ORAL | Status: DC
  Administered 2022-09-15: 500 mg via ORAL

## 2022-09-15 MED ORDER — PROPOFOL 500 MG/50ML IV EMUL
INTRAVENOUS | Status: DC | PRN
  Administered 2022-09-15: 25 ug/kg/min via INTRAVENOUS

## 2022-09-15 MED ORDER — DOCUSATE SODIUM 100 MG PO CAPS
100.0000 mg | ORAL_CAPSULE | Freq: Two times a day (BID) | ORAL | 1 refills | Status: AC
  Filled 2022-09-15: qty 60, 30d supply, fill #0

## 2022-09-15 MED ORDER — TRANEXAMIC ACID-NACL 1000-0.7 MG/100ML-% IV SOLN
1000.0000 mg | INTRAVENOUS | Status: AC
  Administered 2022-09-15: 1000 mg via INTRAVENOUS
  Filled 2022-09-15: qty 100

## 2022-09-15 MED ORDER — ASPIRIN 81 MG PO CHEW
81.0000 mg | CHEWABLE_TABLET | Freq: Two times a day (BID) | ORAL | 0 refills | Status: AC
  Filled 2022-09-15: qty 90, 45d supply, fill #0

## 2022-09-15 MED ORDER — ISOPROPYL ALCOHOL 70 % SOLN
Status: DC | PRN
  Administered 2022-09-15: 1 via TOPICAL

## 2022-09-15 MED ORDER — ACETAMINOPHEN 325 MG PO TABS: 325.0000 mg | ORAL_TABLET | Freq: Four times a day (QID) | ORAL | Status: DC | PRN

## 2022-09-15 MED ORDER — BUPIVACAINE HCL (PF) 0.25 % IJ SOLN
INTRAMUSCULAR | Status: AC
  Filled 2022-09-15: qty 30

## 2022-09-15 MED ORDER — HYDROCODONE-ACETAMINOPHEN 7.5-325 MG PO TABS: 1.0000 | ORAL_TABLET | ORAL | Status: DC | PRN

## 2022-09-15 MED ORDER — BUPIVACAINE-EPINEPHRINE 0.25% -1:200000 IJ SOLN
INTRAMUSCULAR | Status: DC | PRN
  Administered 2022-09-15: 30 mL

## 2022-09-15 MED ORDER — SODIUM CHLORIDE 0.9 % IR SOLN
Status: DC | PRN
  Administered 2022-09-15: 1000 mL
  Administered 2022-09-15: 3000 mL
  Administered 2022-09-15: 1000 mL

## 2022-09-15 MED ORDER — ONDANSETRON HCL 4 MG PO TABS: 4.0000 mg | ORAL_TABLET | Freq: Four times a day (QID) | ORAL | Status: DC | PRN

## 2022-09-15 MED ORDER — METHOCARBAMOL 500 MG IVPB - SIMPLE MED
500.0000 mg | Freq: Four times a day (QID) | INTRAVENOUS | Status: DC | PRN
  Administered 2022-09-15: 500 mg via INTRAVENOUS

## 2022-09-15 MED ORDER — LACTATED RINGERS IV BOLUS
500.0000 mL | Freq: Once | INTRAVENOUS | Status: AC
  Administered 2022-09-15: 500 mL via INTRAVENOUS

## 2022-09-15 MED ORDER — ACETAMINOPHEN 500 MG PO TABS: 1000.0000 mg | ORAL_TABLET | Freq: Once | ORAL | Status: DC

## 2022-09-15 MED ORDER — DEXAMETHASONE SODIUM PHOSPHATE 10 MG/ML IJ SOLN
INTRAMUSCULAR | Status: AC
  Filled 2022-09-15: qty 1

## 2022-09-15 MED ORDER — HYDROMORPHONE HCL 1 MG/ML IJ SOLN: 0.2500 mg | INTRAMUSCULAR | Status: DC | PRN

## 2022-09-15 MED ORDER — HYDROCODONE-ACETAMINOPHEN 5-325 MG PO TABS
ORAL_TABLET | ORAL | Status: AC
  Filled 2022-09-15: qty 2

## 2022-09-15 MED ORDER — METOCLOPRAMIDE HCL 5 MG PO TABS: 5.0000 mg | ORAL_TABLET | Freq: Three times a day (TID) | ORAL | Status: DC | PRN

## 2022-09-15 MED ORDER — SENNA 8.6 MG PO TABS
2.0000 | ORAL_TABLET | Freq: Every day | ORAL | 1 refills | Status: AC
  Filled 2022-09-15: qty 60, 30d supply, fill #0

## 2022-09-15 MED ORDER — ONDANSETRON HCL 4 MG/2ML IJ SOLN
INTRAMUSCULAR | Status: AC
  Filled 2022-09-15: qty 2

## 2022-09-15 MED ORDER — ONDANSETRON HCL 4 MG/2ML IJ SOLN
INTRAMUSCULAR | Status: DC | PRN
  Administered 2022-09-15: 4 mg via INTRAVENOUS

## 2022-09-15 MED ORDER — PHENYLEPHRINE 80 MCG/ML (10ML) SYRINGE FOR IV PUSH (FOR BLOOD PRESSURE SUPPORT)
PREFILLED_SYRINGE | INTRAVENOUS | Status: DC | PRN
  Administered 2022-09-15 (×4): 40 ug via INTRAVENOUS

## 2022-09-15 MED ORDER — MORPHINE SULFATE (PF) 2 MG/ML IV SOLN: 0.5000 mg | INTRAVENOUS | Status: DC | PRN

## 2022-09-15 MED ORDER — OXYCODONE HCL 5 MG PO TABS
5.0000 mg | ORAL_TABLET | Freq: Once | ORAL | Status: AC | PRN
  Administered 2022-09-15: 5 mg via ORAL

## 2022-09-15 MED ORDER — METOCLOPRAMIDE HCL 5 MG/ML IJ SOLN: 5.0000 mg | Freq: Three times a day (TID) | INTRAMUSCULAR | Status: DC | PRN

## 2022-09-15 MED ORDER — PROPOFOL 1000 MG/100ML IV EMUL
INTRAVENOUS | Status: AC
  Filled 2022-09-15: qty 100

## 2022-09-15 MED ORDER — HYDROCODONE-ACETAMINOPHEN 5-325 MG PO TABS
1.0000 | ORAL_TABLET | ORAL | Status: DC | PRN
  Administered 2022-09-15: 2 via ORAL

## 2022-09-15 MED ORDER — KETOROLAC TROMETHAMINE 30 MG/ML IJ SOLN
INTRAMUSCULAR | Status: DC | PRN
  Administered 2022-09-15: 30 mg

## 2022-09-15 MED ORDER — SODIUM CHLORIDE (PF) 0.9 % IJ SOLN
INTRAMUSCULAR | Status: AC
  Filled 2022-09-15: qty 30

## 2022-09-15 MED ORDER — PROMETHAZINE HCL 25 MG/ML IJ SOLN: 6.2500 mg | INTRAMUSCULAR | Status: DC | PRN

## 2022-09-15 MED ORDER — PHENYLEPHRINE HCL-NACL 20-0.9 MG/250ML-% IV SOLN
INTRAVENOUS | Status: AC
  Filled 2022-09-15: qty 250

## 2022-09-15 MED ORDER — ACETAMINOPHEN 500 MG PO TABS
ORAL_TABLET | ORAL | Status: AC
  Filled 2022-09-15: qty 1

## 2022-09-15 MED ORDER — BUPIVACAINE IN DEXTROSE 0.75-8.25 % IT SOLN
INTRATHECAL | Status: DC | PRN
  Administered 2022-09-15: 12 mg via INTRATHECAL

## 2022-09-15 MED ORDER — LACTATED RINGERS IV SOLN: INTRAVENOUS | Status: DC

## 2022-09-15 MED ORDER — ONDANSETRON HCL 4 MG/2ML IJ SOLN: 4.0000 mg | Freq: Four times a day (QID) | INTRAMUSCULAR | Status: DC | PRN

## 2022-09-15 MED ORDER — CHLORHEXIDINE GLUCONATE 0.12 % MT SOLN
15.0000 mL | Freq: Once | OROMUCOSAL | Status: AC
  Administered 2022-09-15: 15 mL via OROMUCOSAL

## 2022-09-15 MED ORDER — FENTANYL CITRATE (PF) 100 MCG/2ML IJ SOLN
INTRAMUSCULAR | Status: AC
  Filled 2022-09-15: qty 2

## 2022-09-15 MED ORDER — PROPOFOL 10 MG/ML IV BOLUS
INTRAVENOUS | Status: DC | PRN
  Administered 2022-09-15: 10 mg via INTRAVENOUS
  Administered 2022-09-15: 50 mg via INTRAVENOUS
  Administered 2022-09-15: 10 mg via INTRAVENOUS
  Administered 2022-09-15 (×2): 20 mg via INTRAVENOUS

## 2022-09-15 MED ORDER — POVIDONE-IODINE 10 % EX SWAB: 2.0000 | Freq: Once | CUTANEOUS | Status: DC

## 2022-09-15 MED ORDER — LACTATED RINGERS IV BOLUS
250.0000 mL | Freq: Once | INTRAVENOUS | Status: AC
  Administered 2022-09-15: 250 mL via INTRAVENOUS

## 2022-09-15 MED ORDER — SODIUM CHLORIDE (PF) 0.9 % IJ SOLN
INTRAMUSCULAR | Status: DC | PRN
  Administered 2022-09-15: 30 mL

## 2022-09-15 MED ORDER — EPINEPHRINE PF 1 MG/ML IJ SOLN
INTRAMUSCULAR | Status: AC
  Filled 2022-09-15: qty 1

## 2022-09-15 MED ORDER — ONDANSETRON HCL 4 MG PO TABS
4.0000 mg | ORAL_TABLET | Freq: Three times a day (TID) | ORAL | 0 refills | Status: DC | PRN
  Filled 2022-09-15: qty 20, 7d supply, fill #0

## 2022-09-15 SURGICAL SUPPLY — 61 items
ADH SKN CLS APL DERMABOND .7 (GAUZE/BANDAGES/DRESSINGS) ×2
APL PRP STRL LF DISP 70% ISPRP (MISCELLANEOUS) ×1
BAG COUNTER SPONGE SURGICOUNT (BAG) IMPLANT
BAG DECANTER FOR FLEXI CONT (MISCELLANEOUS) IMPLANT
BAG SPEC THK2 15X12 ZIP CLS (MISCELLANEOUS) ×1
BAG SPNG CNTER NS LX DISP (BAG) ×1
BAG ZIPLOCK 12X15 (MISCELLANEOUS) IMPLANT
CHLORAPREP W/TINT 26 (MISCELLANEOUS) ×1 IMPLANT
COVER PERINEAL POST (MISCELLANEOUS) ×1 IMPLANT
COVER SURGICAL LIGHT HANDLE (MISCELLANEOUS) ×1 IMPLANT
DERMABOND ADVANCED .7 DNX12 (GAUZE/BANDAGES/DRESSINGS) ×2 IMPLANT
DRAPE IMP U-DRAPE 54X76 (DRAPES) ×1 IMPLANT
DRAPE SHEET LG 3/4 BI-LAMINATE (DRAPES) ×3 IMPLANT
DRAPE STERI IOBAN 125X83 (DRAPES) ×1 IMPLANT
DRAPE U-SHAPE 47X51 STRL (DRAPES) ×1 IMPLANT
DRSG AQUACEL AG ADV 3.5X10 (GAUZE/BANDAGES/DRESSINGS) ×1 IMPLANT
ELECT REM PT RETURN 15FT ADLT (MISCELLANEOUS) ×1 IMPLANT
FILTER STRAW (MISCELLANEOUS) IMPLANT
GAUZE SPONGE 4X4 12PLY STRL (GAUZE/BANDAGES/DRESSINGS) ×1 IMPLANT
GLOVE BIO SURGEON STRL SZ7 (GLOVE) ×1 IMPLANT
GLOVE BIO SURGEON STRL SZ8.5 (GLOVE) ×2 IMPLANT
GLOVE BIOGEL PI IND STRL 7.5 (GLOVE) ×1 IMPLANT
GLOVE BIOGEL PI IND STRL 8.5 (GLOVE) ×1 IMPLANT
GOWN SPEC L3 XXLG W/TWL (GOWN DISPOSABLE) ×1 IMPLANT
GOWN STRL REUS W/ TWL XL LVL3 (GOWN DISPOSABLE) ×1 IMPLANT
GOWN STRL REUS W/TWL XL LVL3 (GOWN DISPOSABLE) ×1
HANDPIECE INTERPULSE COAX TIP (DISPOSABLE) ×1
HD FEM MOD 36M STD (Miscellaneous) ×1 IMPLANT
HEAD FEM MOD 36M STD (Miscellaneous) IMPLANT
HOLDER FOLEY CATH W/STRAP (MISCELLANEOUS) ×1 IMPLANT
HOOD PEEL AWAY T7 (MISCELLANEOUS) ×3 IMPLANT
KIT TURNOVER KIT A (KITS) IMPLANT
LINER NCONST LONG G 36 G7 +5 (Liner) IMPLANT
MANIFOLD NEPTUNE II (INSTRUMENTS) ×1 IMPLANT
MARKER SKIN DUAL TIP RULER LAB (MISCELLANEOUS) ×1 IMPLANT
NDL SAFETY ECLIP 18X1.5 (MISCELLANEOUS) ×1 IMPLANT
NDL SIDE PORT 18GA QUICK PRESS (NEEDLE) IMPLANT
NDL SPNL 18GX3.5 QUINCKE PK (NEEDLE) ×1 IMPLANT
NEEDLE SPNL 18GX3.5 QUINCKE PK (NEEDLE) ×1 IMPLANT
PACK ANTERIOR HIP CUSTOM (KITS) ×1 IMPLANT
SAW OSC TIP CART 19.5X105X1.3 (SAW) ×1 IMPLANT
SEALER BIPOLAR AQUA 6.0 (INSTRUMENTS) ×1 IMPLANT
SET HNDPC FAN SPRY TIP SCT (DISPOSABLE) ×1 IMPLANT
SHELL ACET G7 4H 60 SZG (Shell) IMPLANT
SOLUTION PRONTOSAN WOUND 350ML (IRRIGATION / IRRIGATOR) ×1 IMPLANT
SPIKE FLUID TRANSFER (MISCELLANEOUS) ×1 IMPLANT
STEM FEM CEMTL 133D 14X113X196 (Stem) IMPLANT
SUT MNCRL AB 3-0 PS2 18 (SUTURE) ×1 IMPLANT
SUT MON AB 2-0 CT1 36 (SUTURE) ×1 IMPLANT
SUT STRATAFIX PDO 1 14 VIOLET (SUTURE) ×1
SUT STRATFX PDO 1 14 VIOLET (SUTURE) ×1
SUT VIC AB 2-0 CT1 27 (SUTURE)
SUT VIC AB 2-0 CT1 TAPERPNT 27 (SUTURE) IMPLANT
SUTURE STRATFX PDO 1 14 VIOLET (SUTURE) ×1 IMPLANT
SYR 27GX1/2 1ML LL SAFETY (SYRINGE) IMPLANT
SYR 3ML LL SCALE MARK (SYRINGE) ×1 IMPLANT
TRAY FOL W/BAG SLVR 16FR STRL (SET/KITS/TRAYS/PACK) IMPLANT
TRAY FOLEY MTR SLVR 16FR STAT (SET/KITS/TRAYS/PACK) IMPLANT
TRAY FOLEY W/BAG SLVR 16FR LF (SET/KITS/TRAYS/PACK) ×1
TUBE SUCTION HIGH CAP CLEAR NV (SUCTIONS) ×1 IMPLANT
WATER STERILE IRR 1000ML POUR (IV SOLUTION) ×1 IMPLANT

## 2022-09-15 NOTE — Transfer of Care (Signed)
Immediate Anesthesia Transfer of Care Note  Patient: Daniel Wagner  Procedure(s) Performed: TOTAL HIP ARTHROPLASTY ANTERIOR APPROACH (Left: Hip)  Patient Location: PACU  Anesthesia Type:Spinal  Level of Consciousness: awake, alert , and patient cooperative  Airway & Oxygen Therapy: Patient Spontanous Breathing and Patient connected to face mask oxygen  Post-op Assessment: Report given to RN and Post -op Vital signs reviewed and stable  Post vital signs: Reviewed and stable  Last Vitals:  Vitals Value Taken Time  BP 112/75 09/15/22 1049  Temp    Pulse 65 09/15/22 1050  Resp 12 09/15/22 1050  SpO2 100 % 09/15/22 1050  Vitals shown include unvalidated device data.  Last Pain:  Vitals:   09/15/22 0736  TempSrc:   PainSc: 6          Complications: No notable events documented.

## 2022-09-15 NOTE — Discharge Instructions (Addendum)
? ?Dr. Brian Swinteck ?Joint Replacement Specialist ?[facility] ?[address] ?,  [id number] ?[phone] ? ? ?TOTAL HIP REPLACEMENT POSTOPERATIVE DIRECTIONS ? ? ? ?Hip Rehabilitation, Guidelines Following Surgery  ? ?WEIGHT BEARING ?Weight bearing as tolerated with assist device (walker, cane, etc) as directed, use it as long as suggested by your surgeon or therapist, typically at least 4-6 weeks. ? ?The results of a hip operation are greatly improved after range of motion and muscle strengthening exercises. Follow all safety measures which are given to protect your hip. If any of these exercises cause increased pain or swelling in your joint, decrease the amount until you are comfortable again. Then slowly increase the exercises. Call your caregiver if you have problems or questions.  ? ?HOME CARE INSTRUCTIONS  ?Most of the following instructions are designed to prevent the dislocation of your new hip.  ?Remove items at home which could result in a fall. This includes throw rugs or furniture in walking pathways.  ?Continue medications as instructed at time of discharge. ?You may have some home medications which will be placed on hold until you complete the course of blood thinner medication. ?You may start showering once you are discharged home. Do not remove your dressing. ?Do not put on socks or shoes without following the instructions of your caregivers.   ?Sit on chairs with arms. Use the chair arms to help push yourself up when arising.  ?Arrange for the use of a toilet seat elevator so you are not sitting low.  ?Walk with walker as instructed.  ?You may resume a sexual relationship in one month or when given the OK by your caregiver.  ?Use walker as long as suggested by your caregivers.  ?You may put full weight on your legs and walk as much as is comfortable. ?Avoid periods of inactivity such as sitting longer than an hour when not asleep. This helps prevent blood  clots.  ?You may return to work once you are cleared by your surgeon.  ?Do not drive a car for 6 weeks or until released by your surgeon.  ?Do not drive while taking narcotics.  ?Wear elastic stockings for two weeks following surgery during the day but you may remove then at night.  ?Make sure you keep all of your appointments after your operation with all of your doctors and caregivers. You should call the office at the above phone number and make an appointment for approximately two weeks after the date of your surgery. ?Please pick up a stool softener and laxative for home use as long as you are requiring pain medications. ?ICE to the affected hip every three hours for 30 minutes at a time and then as needed for pain and swelling. Continue to use ice on the hip for pain and swelling from surgery. You may notice swelling that will progress down to the foot and ankle.  This is normal after surgery.  Elevate the leg when you are not up walking on it.   ?It is important for you to complete the blood thinner medication as prescribed by your doctor. ?Continue to use the breathing machine which will help keep your temperature down.  It is common for your temperature to cycle up and down following surgery, especially at night when you are not up moving around and exerting yourself.  The breathing machine keeps your lungs expanded and your temperature down. ? ?RANGE OF MOTION AND STRENGTHENING EXERCISES  ?These exercises are designed to help you   keep full movement of your hip joint. Follow your caregiver's or physical therapist's instructions. Perform all exercises about fifteen times, three times per day or as directed. Exercise both hips, even if you have had only one joint replacement. These exercises can be done on a training (exercise) mat, on the floor, on a table or on a bed. Use whatever works the best and is most comfortable for you. Use music or television while you are exercising so that the exercises are a  pleasant break in your day. This will make your life better with the exercises acting as a break in routine you can look forward to.  ?Lying on your back, slowly slide your foot toward your buttocks, raising your knee up off the floor. Then slowly slide your foot back down until your leg is straight again.  ?Lying on your back spread your legs as far apart as you can without causing discomfort.  ?Lying on your side, raise your upper leg and foot straight up from the floor as far as is comfortable. Slowly lower the leg and repeat.  ?Lying on your back, tighten up the muscle in the front of your thigh (quadriceps muscles). You can do this by keeping your leg straight and trying to raise your heel off the floor. This helps strengthen the largest muscle supporting your knee.  ?Lying on your back, tighten up the muscles of your buttocks both with the legs straight and with the knee bent at a comfortable angle while keeping your heel on the floor.  ? ?SKILLED REHAB INSTRUCTIONS: ?If the patient is transferred to a skilled rehab facility following release from the hospital, a list of the current medications will be sent to the facility for the patient to continue.  When discharged from the skilled rehab facility, please have the facility set up the patient's Home Health Physical Therapy prior to being released. Also, the skilled facility will be responsible for providing the patient with their medications at time of release from the facility to include their pain medication and their blood thinner medication. If the patient is still at the rehab facility at time of the two week follow up appointment, the skilled rehab facility will also need to assist the patient in arranging follow up appointment in our office and any transportation needs. ? ?POST-OPERATIVE OPIOID TAPER INSTRUCTIONS: ?It is important to wean off of your opioid medication as soon as possible. If you do not need pain medication after your surgery it is ok  to stop day one. ?Opioids include: ?Codeine, Hydrocodone(Norco, Vicodin), Oxycodone(Percocet, oxycontin) and hydromorphone amongst others.  ?Long term and even short term use of opiods can cause: ?Increased pain response ?Dependence ?Constipation ?Depression ?Respiratory depression ?And more.  ?Withdrawal symptoms can include ?Flu like symptoms ?Nausea, vomiting ?And more ?Techniques to manage these symptoms ?Hydrate well ?Eat regular healthy meals ?Stay active ?Use relaxation techniques(deep breathing, meditating, yoga) ?Do Not substitute Alcohol to help with tapering ?If you have been on opioids for less than two weeks and do not have pain than it is ok to stop all together.  ?Plan to wean off of opioids ?This plan should start within one week post op of your joint replacement. ?Maintain the same interval or time between taking each dose and first decrease the dose.  ?Cut the total daily intake of opioids by one tablet each day ?Next start to increase the time between doses. ?The last dose that should be eliminated is the evening dose.  ? ? ?MAKE   SURE YOU:  ?Understand these instructions.  ?Will watch your condition.  ?Will get help right away if you are not doing well or get worse. ? ?Pick up stool softner and laxative for home use following surgery while on pain medications. ?Do not remove your dressing. ?The dressing is waterproof--it is OK to take showers. ?Continue to use ice for pain and swelling after surgery. ?Do not use any lotions or creams on the incision until instructed by your surgeon. ?Total Hip Protocol. ? ?

## 2022-09-15 NOTE — Anesthesia Procedure Notes (Signed)
Procedure Name: MAC Date/Time: 09/15/2022 8:36 AM  Performed by: Ludwig Lean, CRNAPre-anesthesia Checklist: Patient identified, Emergency Drugs available, Suction available and Patient being monitored Patient Re-evaluated:Patient Re-evaluated prior to induction Oxygen Delivery Method: Simple face mask Placement Confirmation: positive ETCO2 and breath sounds checked- equal and bilateral Dental Injury: Teeth and Oropharynx as per pre-operative assessment

## 2022-09-15 NOTE — Interval H&P Note (Signed)
History and Physical Interval Note:  09/15/2022 7:52 AM  Daniel Wagner  has presented today for surgery, with the diagnosis of Left hip osteoarthritis.  The various methods of treatment have been discussed with the patient and family. After consideration of risks, benefits and other options for treatment, the patient has consented to  Procedure(s) with comments: TOTAL HIP ARTHROPLASTY ANTERIOR APPROACH (Left) - 150 as a surgical intervention.  The patient's history has been reviewed, patient examined, no change in status, stable for surgery.  I have reviewed the patient's chart and labs.  Questions were answered to the patient's satisfaction.     Iline Oven Inika Bellanger

## 2022-09-15 NOTE — Evaluation (Signed)
Physical Therapy Evaluation Patient Details Name: Daniel Wagner MRN: [id number] DOB: 1940-05-14 Today's Date: 09/15/2022  History of Present Illness  82 yo male presents to therapy s/p L THA, anterior approach on 09/15/2022 due to failure of conservative measures. Pt has PMH including but not limited to: chronic thoracic pain- stenosis, myelopathy and spondylosis, s/p ACDF of c-spine, charcot arthropathy, R foot fx, lumbar spondylosis with neurogenic claudication s/p lumbar sx, DM II, HDL, and HTN.  Clinical Impression    Daniel Wagner is a 82 y.o. male POD 0 s/p L THA. Patient reports IND with mobility at baseline. Patient is now limited by functional impairments (see PT problem list below) and requires min guard and cues for transfers and gait with RW. Patient was able to ambulate 50 and 10 feet with RW and min guard progressing to S and cues for safe walker management. Patient educated on safe sequencing for stair mobility, car transfers, fall risk prevention, pain management and goal, use of CP and importance of slowly progressing activity pt and spouse verbalized understanding of safe guarding position for people assisting with mobility. Patient instructed in exercises to facilitate ROM and circulation reviewed with HO provided. Patient will benefit from continued skilled PT interventions to address impairments and progress towards PLOF. Patient has met mobility goals at adequate level for discharge home with family support and HEP; will continue to follow if pt continues acute stay to progress towards Mod I goals.      Recommendations for follow up therapy are one component of a multi-disciplinary discharge planning process, led by the attending physician.  Recommendations may be updated based on patient status, additional functional criteria and insurance authorization.  Follow Up Recommendations       Assistance Recommended at Discharge Intermittent Supervision/Assistance  Patient can return  home with the following  A little help with walking and/or transfers;A little help with bathing/dressing/bathroom;Assistance with cooking/housework;Help with stairs or ramp for entrance;Assist for transportation    Equipment Recommendations None recommended by PT (pt reports DME in home setting)  Recommendations for Other Services       Functional Status Assessment Patient has had a recent decline in their functional status and demonstrates the ability to make significant improvements in function in a reasonable and predictable amount of time.     Precautions / Restrictions Precautions Precautions: Fall Restrictions Weight Bearing Restrictions: No      Mobility  Bed Mobility Overal bed mobility: Needs Assistance Bed Mobility: Supine to Sit     Supine to sit: Min guard     General bed mobility comments: min cues for supine to sit    Transfers Overall transfer level: Needs assistance Equipment used: Rolling walker (2 wheels) Transfers: Sit to/from Stand Sit to Stand: Min guard           General transfer comment: min cues for proper UE placement    Ambulation/Gait Ambulation/Gait assistance: Min guard Gait Distance (Feet): 50 Feet Assistive device: Rolling walker (2 wheels) Gait Pattern/deviations: Step-to pattern, Antalgic Gait velocity: decreased     General Gait Details: B UE support at RW to offload LLE in stance phase  Stairs Stairs: Yes Stairs assistance: Min guard Stair Management: Two rails Number of Stairs: 2 General stair comments: pt verbal and visual demonstration of proper technique with step navigation using RW for one step to enter home. pt able to recall proper LE to ascend/descend  Wheelchair Mobility    Modified Rankin (Stroke Patients Only)  Balance Overall balance assessment: Needs assistance Sitting-balance support: Feet supported Sitting balance-Leahy Scale: Good     Standing balance support: Bilateral upper extremity  supported, During functional activity, Reliant on assistive device for balance Standing balance-Leahy Scale: Fair Standing balance comment: static standing no UE support                             Pertinent Vitals/Pain Pain Assessment Pain Assessment: 0-10 Pain Score: 7  Pain Location: L hip Pain Descriptors / Indicators: Aching, Constant, Discomfort, Operative site guarding, Burning (stinging, pulling) Pain Intervention(s): Limited activity within patient's tolerance, Monitored during session, Premedicated before session, Repositioned, Ice applied    Home Living Family/patient expects to be discharged to:: Private residence Living Arrangements: Spouse/significant other Available Help at Discharge: Family;Available 24 hours/day Type of Home: House Home Access: Stairs to enter Entrance Stairs-Rails: Right Entrance Stairs-Number of Steps: 1   Home Layout: One level Home Equipment: Agricultural consultant (2 wheels);Hand held shower head;Shower seat      Prior Function Prior Level of Function : Independent/Modified Independent;Driving             Mobility Comments: IND with all ADLs, self care tasks, IADLs, driving       Hand Dominance        Extremity/Trunk Assessment        Lower Extremity Assessment Lower Extremity Assessment: LLE deficits/detail LLE Deficits / Details: ankle DF/PF 5/5 LLE Sensation: WNL    Cervical / Trunk Assessment Cervical / Trunk Assessment: Neck Surgery  Communication   Communication: No difficulties  Cognition Arousal/Alertness: Awake/alert Behavior During Therapy: WFL for tasks assessed/performed Overall Cognitive Status: Within Functional Limits for tasks assessed                                          General Comments      Exercises Total Joint Exercises Ankle Circles/Pumps: AROM, 20 reps, Both Quad Sets: AROM, Left, 5 reps Short Arc Quad: AROM, Left, 5 reps Heel Slides: AROM, Left, 5 reps Hip  ABduction/ADduction: AROM, Left, 5 reps, Supine, Standing Knee Flexion: AROM, Left, 5 reps, Standing Standing Hip Extension: AROM, Left, 5 reps   Assessment/Plan    PT Assessment Patient needs continued PT services  PT Problem List Decreased strength;Decreased range of motion;Decreased activity tolerance;Decreased balance;Decreased mobility;Decreased coordination;Pain       PT Treatment Interventions DME instruction;Gait training;Stair training;Functional mobility training;Therapeutic activities;Therapeutic exercise;Neuromuscular re-education;Balance training;Patient/family education;Modalities    PT Goals (Current goals can be found in the Care Plan section)  Acute Rehab PT Goals Patient Stated Goal: to get back to doing what I do without pain PT Goal Formulation: With patient Time For Goal Achievement: 09/29/22 Potential to Achieve Goals: Good    Frequency       Co-evaluation               AM-PAC PT "6 Clicks" Mobility  Outcome Measure Help needed turning from your back to your side while in a flat bed without using bedrails?: A Little Help needed moving from lying on your back to sitting on the side of a flat bed without using bedrails?: A Little Help needed moving to and from a bed to a chair (including a wheelchair)?: A Little Help needed standing up from a chair using your arms (e.g., wheelchair or bedside chair)?: A Little Help needed to  walk in hospital room?: A Little Help needed climbing 3-5 steps with a railing? : A Little 6 Click Score: 18    End of Session Equipment Utilized During Treatment: Gait belt Activity Tolerance: Patient tolerated treatment well Patient left: in chair;with call bell/phone within reach;with family/visitor present Nurse Communication: Mobility status;Other (comment) (readiness of d/c from PT standpoint) PT Visit Diagnosis: Unsteadiness on feet (R26.81);Other abnormalities of gait and mobility (R26.89);Muscle weakness (generalized)  (M62.81);Difficulty in walking, not elsewhere classified (R26.2);Pain Pain - Right/Left: Left Pain - part of body: Hip    Time: 1421-1516 PT Time Calculation (min) (ACUTE ONLY): 55 min   Charges:   PT Evaluation $PT Eval Low Complexity: 1 Low PT Treatments $Gait Training: 8-22 mins $Therapeutic Exercise: 8-22 mins $Therapeutic Activity: 8-22 mins        Johnny Bridge, PT Acute Rehab   Jacqualyn Posey 09/15/2022, 3:27 PM

## 2022-09-15 NOTE — Op Note (Signed)
OPERATIVE REPORT  SURGEON: Samson Frederic, MD   ASSISTANT: Clint Bolder, PA-C.  PREOPERATIVE DIAGNOSIS: Left hip arthritis.   POSTOPERATIVE DIAGNOSIS: Left hip arthritis.   PROCEDURE: Left total hip arthroplasty, anterior approach.   IMPLANTS: Biomet Taperloc Complete Microplasty stem, size 14 x 113 mm, high offset. Biomet G7 OsseoTi Cup, size 60 mm. Biomet Longevity liner, size 36 mm, G, +5 neutral. Biomet metal head ball, size 36 + 0 mm.  ANESTHESIA:  MAC, Regional, and Spinal  ESTIMATED BLOOD LOSS:-500 mL    ANTIBIOTICS: 2 g Ancef.  DRAINS: None.  COMPLICATIONS: None.   CONDITION: PACU - hemodynamically stable.   BRIEF CLINICAL NOTE: Daniel Wagner is a 82 y.o. male with a long-standing history of Left hip arthritis. After failing conservative management, the patient was indicated for total hip arthroplasty. The risks, benefits, and alternatives to the procedure were explained, and the patient elected to proceed.  PROCEDURE IN DETAIL: Surgical site was marked by myself in the pre-op holding area. Once inside the operating room, spinal anesthesia was obtained, and a foley catheter was inserted. The patient was then positioned on the Hana table.  All bony prominences were well padded.  The hip was prepped and draped in the normal sterile surgical fashion.  A time-out was called verifying side and site of surgery. The patient received IV antibiotics within 60 minutes of beginning the procedure.   Bikini incision was made, and superficial dissection was performed lateral to the ASIS. The direct anterior approach to the hip was performed through the Hueter interval.  Lateral femoral circumflex vessels were treated with the Auqumantys. The anterior capsule was exposed and an inverted T capsulotomy was made. The femoral neck cut was made to the level of the templated cut.  A corkscrew was placed into the head and the head was removed.  The femoral head was found to have eburnated  bone. The head was passed to the back table and was measured. Pubofemoral ligament was released off of the calcar, taking care to stay on bone. Superior capsule was released from the greater trochanter, taking care to stay lateral to the posterior border of the femoral neck in order to preserve the short external rotators.   Acetabular exposure was achieved, and the pulvinar and labrum were excised. Sequential reaming of the acetabulum was then performed up to a size 59 mm reamer. A 60 mm cup was then opened and impacted into place at approximately 40 degrees of abduction and 20 degrees of anteversion. The final polyethylene liner was impacted into place and acetabular osteophytes were removed.    I then gained femoral exposure taking care to protect the abductors and greater trochanter.  This was performed using standard external rotation, extension, and adduction.  A cookie cutter was used to enter the femoral canal, and then the femoral canal finder was placed.  Sequential broaching was performed up to a size 14.  Calcar planer was used on the femoral neck remnant.  I placed a high offset neck and a trial head ball.  The hip was reduced.  Leg lengths and offset were checked fluoroscopically.  The hip was dislocated and trial components were removed.  The final implants were placed, and the hip was reduced.  Fluoroscopy was used to confirm component position and leg lengths.  At 90 degrees of external rotation and full extension, the hip was stable to an anterior directed force.   The wound was copiously irrigated with Prontosan solution and normal saline using pule  lavage.  Marcaine solution was injected into the periarticular soft tissue.  The wound was closed in layers using #1 Vicryl and V-Loc for the fascia, 2-0 Vicryl for the subcutaneous fat, 2-0 Monocryl for the deep dermal layer, 3-0 running Monocryl subcuticular stitch, and Dermabond for the skin.  Once the glue was fully dried, an Aquacell Ag  dressing was applied.  The patient was transported to the recovery room in stable condition.  Sponge, needle, and instrument counts were correct at the end of the case x2.  The patient tolerated the procedure well and there were no known complications.  The aquamantis was utilized for this case to help facilitate better hemostasis as patient was felt to be at increased risk of bleeding because of complex case requiring increased OR time and/or exposure.  A oscillating saw tip was utilized for this case to prevent damage to the soft tissue structures such as muscles, ligaments and tendons, and to ensure accurate bone cuts. This patient was at increased risk for above structures due to  minimally invasive approach.  Please note that a surgical assistant was a medical necessity for this procedure to perform it in a safe and expeditious manner. Assistant was necessary to provide appropriate retraction of vital neurovascular structures, to prevent femoral fracture, and to allow for anatomic placement of the prosthesis.

## 2022-09-15 NOTE — Anesthesia Procedure Notes (Signed)
Spinal  Patient location during procedure: OR End time: 09/15/2022 7:41 AM Reason for block: surgical anesthesia Staffing Performed: anesthesiologist  Anesthesiologist: Jairo Ben, MD Performed by: Jairo Ben, MD Authorized by: Jairo Ben, MD   Preanesthetic Checklist Completed: patient identified, IV checked, site marked, risks and benefits discussed, surgical consent, monitors and equipment checked, pre-op evaluation and timeout performed Spinal Block Patient position: sitting Prep: DuraPrep Patient monitoring: heart rate, cardiac monitor, continuous pulse ox and blood pressure Approach: midline Location: L3-4 Injection technique: single-shot Needle Needle type: Pencan and Introducer  Needle gauge: 24 G Needle length: 9 cm Assessment Sensory level: T4 Events: CSF return Additional Notes Pt identified in Operating room.  Monitors applied. Working IV access confirmed. Sterile prep, drape lumbar spine.  1% lido local L 3,4.  #24ga Pencan into clear CSF L 3,4.  12mg  0.75% Bupivacaine with dextrose injected with asp CSF beginning and end of injection.  Patient asymptomatic, VSS, no heme aspirated, tolerated well.  Sandford Craze, MD

## 2022-09-15 NOTE — Anesthesia Postprocedure Evaluation (Signed)
Anesthesia Post Note  Patient: Daniel Wagner  Procedure(s) Performed: TOTAL HIP ARTHROPLASTY ANTERIOR APPROACH (Left: Hip)     Patient location during evaluation: PACU Anesthesia Type: Spinal Level of consciousness: awake and alert, patient cooperative and oriented Pain management: pain level controlled Vital Signs Assessment: post-procedure vital signs reviewed and stable Respiratory status: nonlabored ventilation, spontaneous breathing and respiratory function stable Cardiovascular status: blood pressure returned to baseline and stable Postop Assessment: no apparent nausea or vomiting, adequate PO intake, patient able to bend at knees and spinal receding Anesthetic complications: no   No notable events documented.  Last Vitals:  Vitals:   09/15/22 1130 09/15/22 1145  BP: 134/82   Pulse: (!) 59 (!) 59  Resp: 10 11  Temp:    SpO2: 100% 100%    Last Pain:  Vitals:   09/15/22 1145  TempSrc:   PainSc: 0-No pain                 Takeru Bose,E. Antonisha Waskey

## 2022-09-16 ENCOUNTER — Encounter (HOSPITAL_COMMUNITY): Payer: Self-pay | Admitting: Orthopedic Surgery

## 2022-09-24 IMAGING — CT CT T SPINE W/O CM
3 series · 10 of 33 positions shown, 11 images · non-contrast
Comparison: MRI thoracic spine 01/15/2017.

CLINICAL DATA: Chronic thoracic spine pain. History of prior lumbar
surgery.

EXAM:
CT THORACIC SPINE WITHOUT CONTRAST
TECHNIQUE: Multidetector CT images of the thoracic were obtained using the
standard protocol without intravenous contrast.

[Series 4: l spine soft · axial · 0.36mm/px · z∈[+1473,+1627]mm · 2 of 168 slices shown, 3 images]
[im 52/168  soft-tissue]
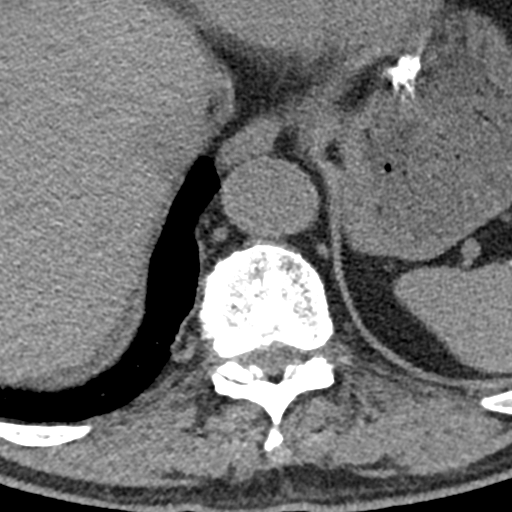
[im 52/168  bone]
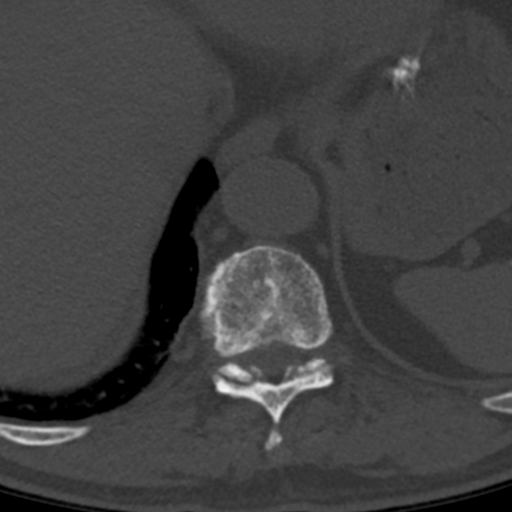
[im 129/168  bone]
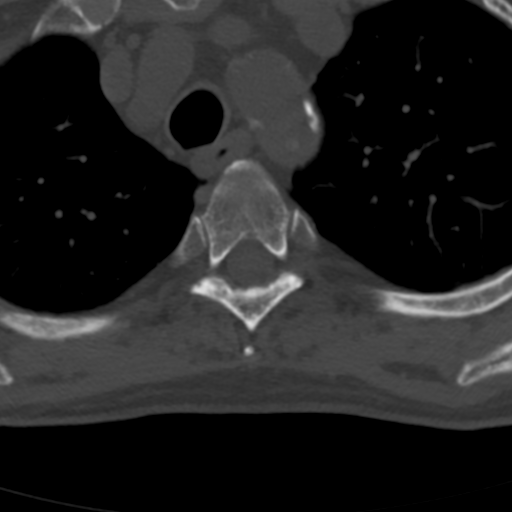

[Series 7: sagittal bone · sagittal · 0.36mm/px · 5 of 126 slices shown]
[im 42/126  bone]
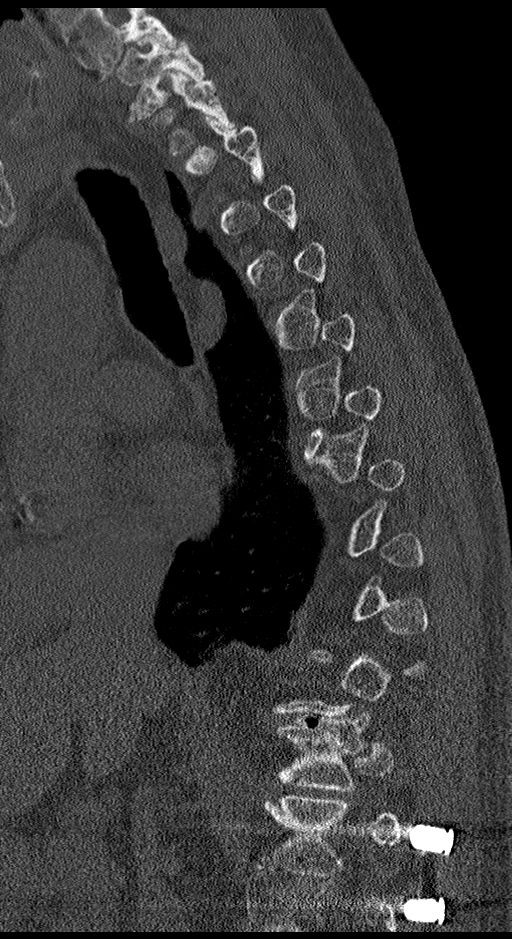
[im 53/126  bone]
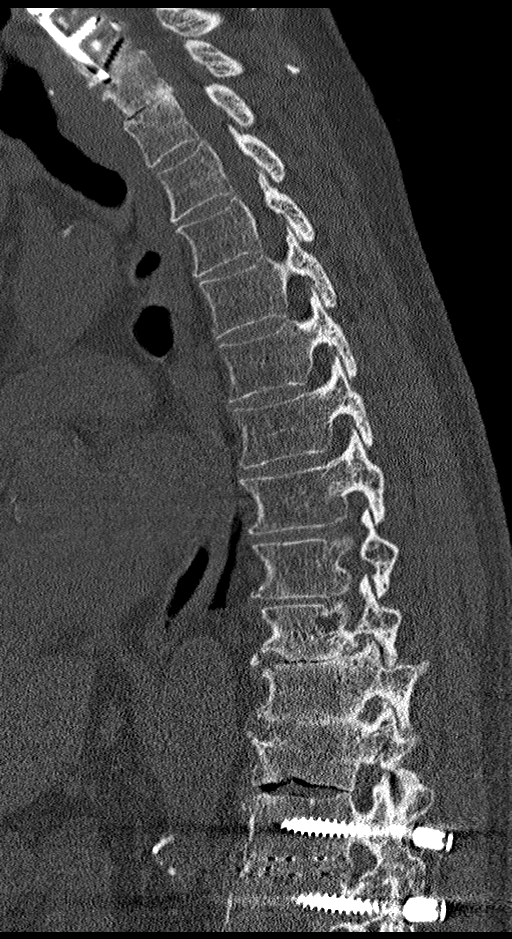
[im 63/126  bone]
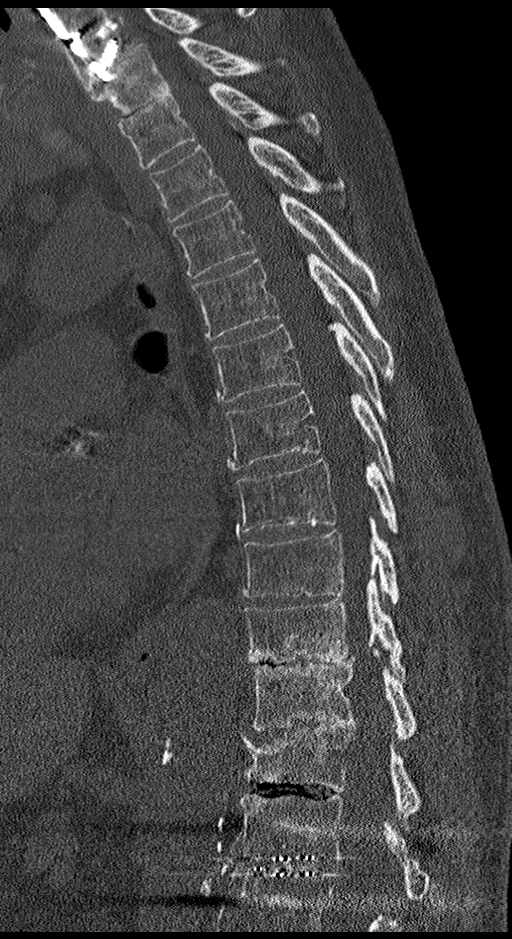
[im 73/126  bone]
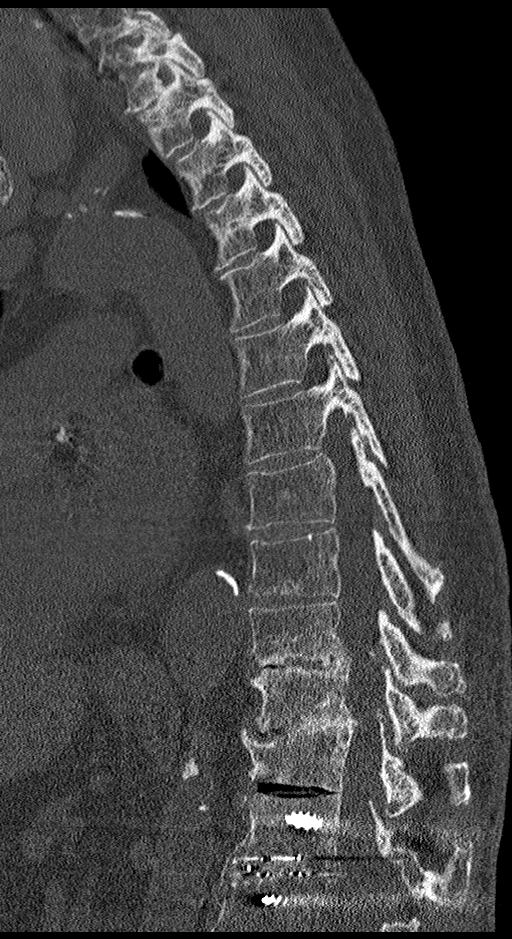
[im 84/126  bone]
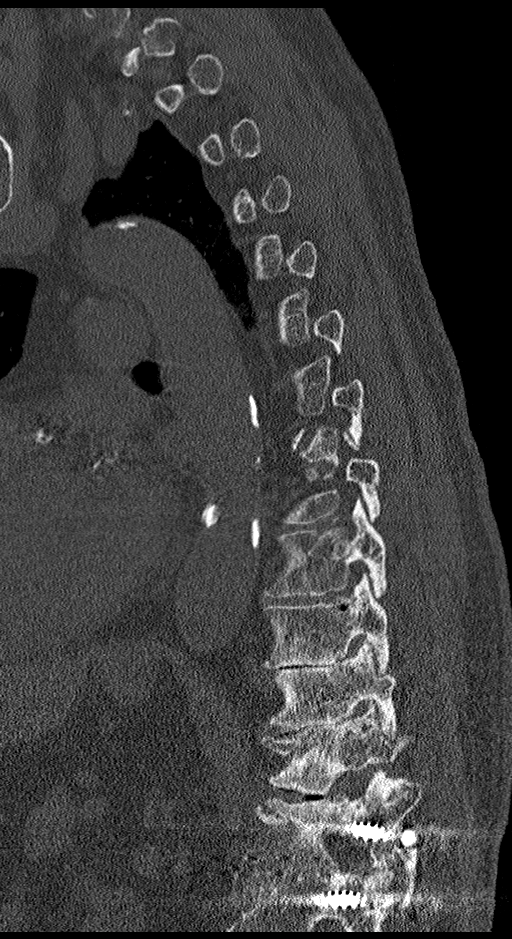

[Series 8: coronal bone · coronal · 0.30mm/px · 3 of 167 slices shown]
[im 34/167  bone]
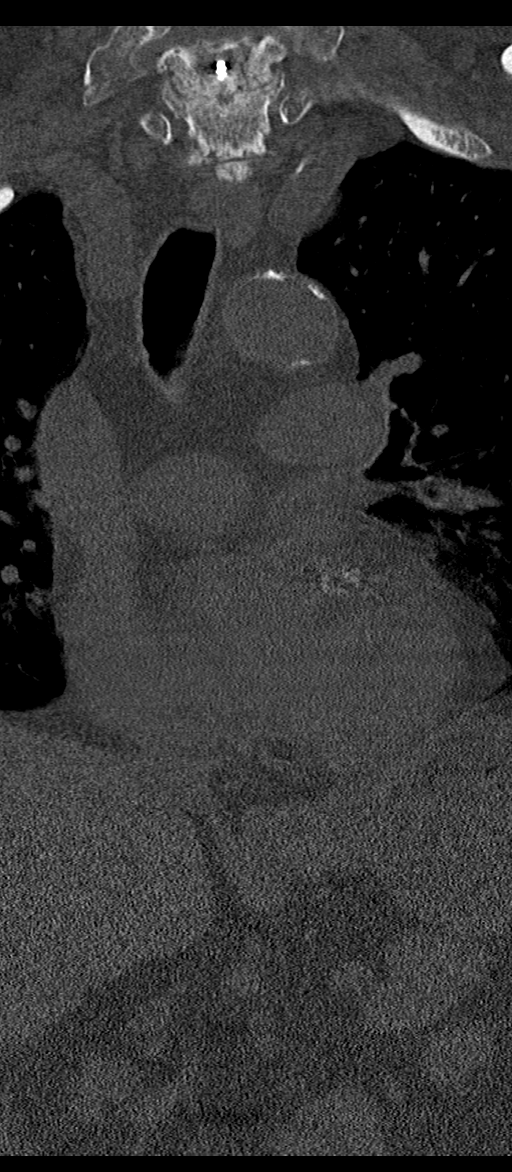
[im 67/167  bone]
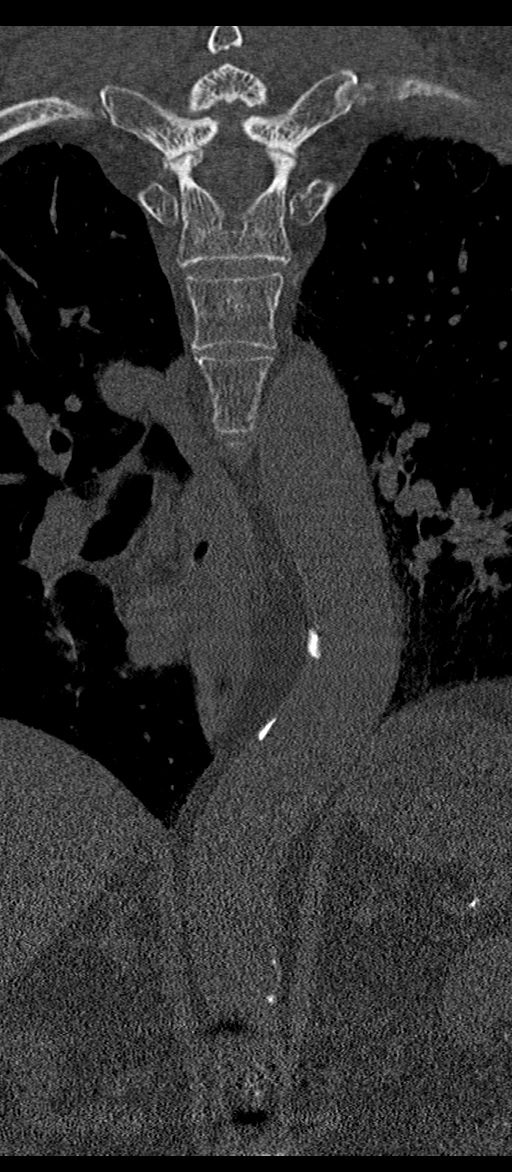
[im 100/167  bone]
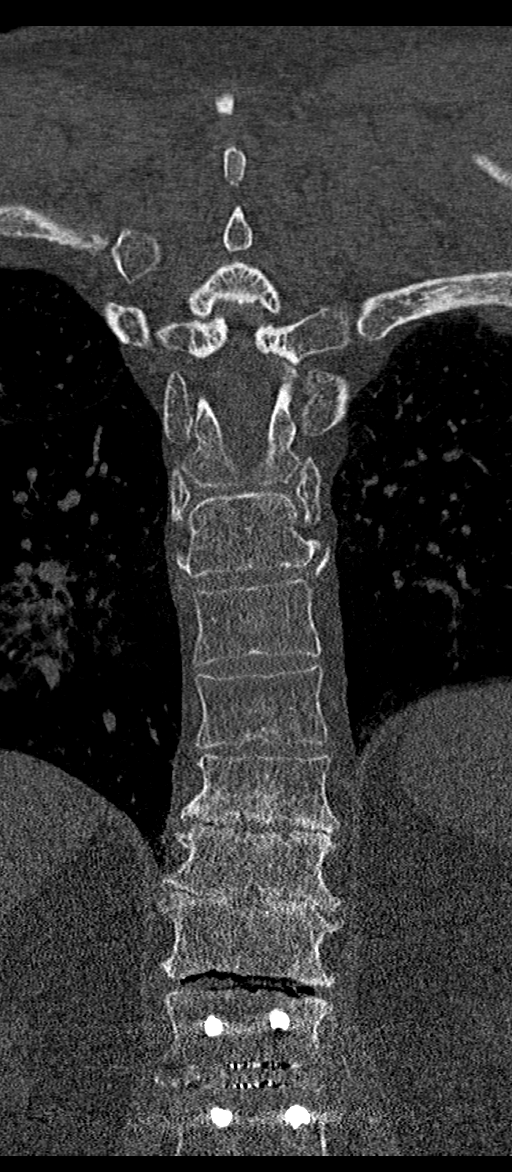

[10 of 33 positions shown; findings below may reference images not displayed]

FINDINGS: Alignment: Maintained.

Vertebrae: Remote anterior, superior endplate compression fracture
of T12 with vertebral body height loss of up to 50% is unchanged. No
acute fracture or focal bone lesion. There is partial visualization
cervical fusion hardware extending to the C7 level. Lumbar fusion
hardware extends from L1 below the inferior margin of the film.

Paraspinal and other soft tissues: Calcific aortic and coronary
atherosclerosis noted. Imaged lung parenchyma clear.

Disc levels: T1-2: Loss of disc space height and a shallow bulge.
The central canal and foramina appear open.

The T2-3 to T8-9 levels are unremarkable.

T9-10: Moderate facet degenerative change is worse on the right.
There is a shallow disc bulge but the central canal and foramina
appear open.

T10-11: Severe loss of disc space height with endplate sclerosis.
Mild endplate spurring. Advanced bilateral facet degenerative change
is worse on the right. Mild to moderate central canal stenosis.
Marked right foraminal narrowing. The left foramen appears open.

T11-12: Marked loss of disc space height. There is some endplate
spurring eccentric to the left. Advanced facet degenerative disease
is worse on the left. Mild central canal stenosis. Moderate to
moderately severe foraminal narrowing is worse on the left.

T12-L1: Loss of disc space height, vacuum disc phenomenon and a disc
bulge. Moderate facet arthropathy. Mild to moderate central canal
stenosis and bilateral foraminal narrowing.
IMPRESSION: Remote anterior, superior endplate compression fracture of T12. No
acute abnormality.

Mild to moderate central canal stenosis at T10-11 where there is
also marked right foraminal narrowing. Advanced facet degenerative
change also noted at this level.

Moderate to moderately severe foraminal narrowing at T11-12 is worse
on the left. There is mild central canal stenosis and advanced facet
degenerative change.

Mild to moderate central canal and bilateral foraminal narrowing at
T12-L1.

## 2022-10-29 ENCOUNTER — Other Ambulatory Visit: Payer: Self-pay | Admitting: Student

## 2022-10-29 DIAGNOSIS — Z96642 Presence of left artificial hip joint: Secondary | ICD-10-CM

## 2022-10-29 DIAGNOSIS — Z471 Aftercare following joint replacement surgery: Secondary | ICD-10-CM

## 2022-10-31 ENCOUNTER — Emergency Department (HOSPITAL_COMMUNITY): Payer: Medicare Other

## 2022-10-31 ENCOUNTER — Encounter (HOSPITAL_COMMUNITY): Admission: EM | Disposition: A | Discharge status: Home / Self Care | Payer: Self-pay | Source: Home / Self Care

## 2022-10-31 ENCOUNTER — Emergency Department (HOSPITAL_BASED_OUTPATIENT_CLINIC_OR_DEPARTMENT_OTHER): Payer: Medicare Other | Admitting: Anesthesiology

## 2022-10-31 ENCOUNTER — Emergency Department (HOSPITAL_COMMUNITY): Payer: Medicare Other | Admitting: Anesthesiology

## 2022-10-31 ENCOUNTER — Other Ambulatory Visit: Payer: Self-pay

## 2022-10-31 ENCOUNTER — Ambulatory Visit (HOSPITAL_COMMUNITY)
Admission: EM | Admit: 2022-10-31 | Discharge: 2022-10-31 | Disposition: A | Discharge status: Home / Self Care | Payer: Medicare Other

## 2022-10-31 DIAGNOSIS — W19XXXA Unspecified fall, initial encounter: Secondary | ICD-10-CM | POA: Diagnosis not present

## 2022-10-31 DIAGNOSIS — Z79899 Other long term (current) drug therapy: Secondary | ICD-10-CM | POA: Insufficient documentation

## 2022-10-31 DIAGNOSIS — S73005A Unspecified dislocation of left hip, initial encounter: Secondary | ICD-10-CM | POA: Diagnosis not present

## 2022-10-31 DIAGNOSIS — T84021A Dislocation of internal left hip prosthesis, initial encounter: Secondary | ICD-10-CM | POA: Insufficient documentation

## 2022-10-31 DIAGNOSIS — S50312A Abrasion of left elbow, initial encounter: Secondary | ICD-10-CM | POA: Diagnosis not present

## 2022-10-31 DIAGNOSIS — Z23 Encounter for immunization: Secondary | ICD-10-CM | POA: Insufficient documentation

## 2022-10-31 DIAGNOSIS — X58XXXA Exposure to other specified factors, initial encounter: Secondary | ICD-10-CM | POA: Diagnosis not present

## 2022-10-31 DIAGNOSIS — I10 Essential (primary) hypertension: Secondary | ICD-10-CM | POA: Insufficient documentation

## 2022-10-31 DIAGNOSIS — K219 Gastro-esophageal reflux disease without esophagitis: Secondary | ICD-10-CM | POA: Insufficient documentation

## 2022-10-31 DIAGNOSIS — S50319A Abrasion of unspecified elbow, initial encounter: Secondary | ICD-10-CM | POA: Insufficient documentation

## 2022-10-31 DIAGNOSIS — S50311A Abrasion of right elbow, initial encounter: Secondary | ICD-10-CM | POA: Diagnosis not present

## 2022-10-31 HISTORY — PX: HIP CLOSED REDUCTION: SHX983

## 2022-10-31 SURGERY — CLOSED REDUCTION, HIP
Anesthesia: General | Site: Hip | Laterality: Left

## 2022-10-31 MED ORDER — HYDROGEN PEROXIDE 3 % EX SOLN
CUTANEOUS | Status: AC
  Filled 2022-10-31: qty 473

## 2022-10-31 MED ORDER — ONDANSETRON HCL 4 MG/2ML IJ SOLN
INTRAMUSCULAR | Status: DC | PRN
  Administered 2022-10-31: 4 mg via INTRAVENOUS

## 2022-10-31 MED ORDER — PROPOFOL 10 MG/ML IV BOLUS
1.0000 mg/kg | Freq: Once | INTRAVENOUS | Status: AC
  Administered 2022-10-31: 84 mg via INTRAVENOUS

## 2022-10-31 MED ORDER — HYDROCODONE-ACETAMINOPHEN 5-325 MG PO TABS: 1.0000 | ORAL_TABLET | Freq: Four times a day (QID) | ORAL | 0 refills | Status: DC | PRN

## 2022-10-31 MED ORDER — MORPHINE SULFATE (PF) 4 MG/ML IV SOLN
4.0000 mg | Freq: Once | INTRAVENOUS | Status: AC
  Administered 2022-10-31: 4 mg via INTRAVENOUS
  Filled 2022-10-31: qty 1

## 2022-10-31 MED ORDER — PROPOFOL 10 MG/ML IV BOLUS
0.5000 mg/kg | Freq: Once | INTRAVENOUS | Status: AC
  Administered 2022-10-31: 80 mg via INTRAVENOUS

## 2022-10-31 MED ORDER — LIDOCAINE HCL (PF) 2 % IJ SOLN
INTRAMUSCULAR | Status: AC
  Filled 2022-10-31: qty 5

## 2022-10-31 MED ORDER — PROPOFOL 10 MG/ML IV BOLUS
0.5000 mg/kg | Freq: Once | INTRAVENOUS | Status: AC
  Administered 2022-10-31: 150 mg via INTRAVENOUS
  Filled 2022-10-31 (×2): qty 20

## 2022-10-31 MED ORDER — PROPOFOL 10 MG/ML IV BOLUS
INTRAVENOUS | Status: AC
  Filled 2022-10-31: qty 20

## 2022-10-31 MED ORDER — ROCURONIUM BROMIDE 10 MG/ML (PF) SYRINGE
PREFILLED_SYRINGE | INTRAVENOUS | Status: AC
  Filled 2022-10-31: qty 10

## 2022-10-31 MED ORDER — ONDANSETRON HCL 4 MG/2ML IJ SOLN
INTRAMUSCULAR | Status: AC
  Filled 2022-10-31: qty 2

## 2022-10-31 MED ORDER — TETANUS-DIPHTH-ACELL PERTUSSIS 5-2.5-18.5 LF-MCG/0.5 IM SUSY: 0.5000 mL | PREFILLED_SYRINGE | Freq: Once | INTRAMUSCULAR | Status: DC

## 2022-10-31 MED ORDER — DEXAMETHASONE SODIUM PHOSPHATE 4 MG/ML IJ SOLN
INTRAMUSCULAR | Status: DC | PRN
  Administered 2022-10-31: 8 mg via INTRAVENOUS

## 2022-10-31 MED ORDER — SUCCINYLCHOLINE CHLORIDE 200 MG/10ML IV SOSY
PREFILLED_SYRINGE | INTRAVENOUS | Status: DC | PRN
  Administered 2022-10-31: 100 mg via INTRAVENOUS

## 2022-10-31 MED ORDER — LACTATED RINGERS IV SOLN: INTRAVENOUS | Status: DC | PRN

## 2022-10-31 MED ORDER — PROPOFOL 10 MG/ML IV BOLUS
0.5000 mg/kg | Freq: Once | INTRAVENOUS | Status: AC
  Administered 2022-10-31: 42 mg via INTRAVENOUS

## 2022-10-31 MED ORDER — DEXAMETHASONE SODIUM PHOSPHATE 10 MG/ML IJ SOLN
INTRAMUSCULAR | Status: AC
  Filled 2022-10-31: qty 1

## 2022-10-31 SURGICAL SUPPLY — 2 items
IMMOBILIZER KNEE 20 (SOFTGOODS) ×1 IMPLANT
IMMOBILIZER KNEE 20 THIGH 36 (SOFTGOODS) IMPLANT

## 2022-10-31 NOTE — Anesthesia Preprocedure Evaluation (Signed)
Anesthesia Evaluation  Patient identified by MRN, date of birth, ID band Patient awake    Reviewed: Allergy & Precautions, NPO status , Patient's Chart, lab work & pertinent test results  Airway Mallampati: II  TM Distance: >3 FB Neck ROM: Full    Dental no notable dental hx.    Pulmonary neg pulmonary ROS   Pulmonary exam normal        Cardiovascular hypertension, Pt. on medications negative cardio ROS  Rhythm:Regular Rate:Normal     Neuro/Psych   Anxiety Depression    negative neurological ROS     GI/Hepatic Neg liver ROS,GERD  Medicated,,  Endo/Other  diabetes, Type 2    Renal/GU   negative genitourinary   Musculoskeletal  (+) Arthritis , Osteoarthritis,  Hip dislocation    Abdominal Normal abdominal exam  (+)   Peds  Hematology negative hematology ROS (+)   Anesthesia Other Findings   Reproductive/Obstetrics                             Anesthesia Physical Anesthesia Plan  ASA: 2  Anesthesia Plan: General   Post-op Pain Management:    Induction: Intravenous  PONV Risk Score and Plan: 2 and Ondansetron, Dexamethasone and Treatment may vary due to age or medical condition  Airway Management Planned: Mask  Additional Equipment: None  Intra-op Plan:   Post-operative Plan:   Informed Consent: I have reviewed the patients History and Physical, chart, labs and discussed the procedure including the risks, benefits and alternatives for the proposed anesthesia with the patient or authorized representative who has indicated his/her understanding and acceptance.     Dental advisory given  Plan Discussed with: CRNA  Anesthesia Plan Comments:        Anesthesia Quick Evaluation

## 2022-10-31 NOTE — H&P (Signed)
ORTHOPAEDIC H&P  REQUESTING PHYSICIAN: Coral Spikes, DO  PCP:  Simone Curia, MD  Chief Complaint: Left hip pain  HPI: Daniel Wagner is a 82 y.o. male who complains of left hip pain and deformity following getting up out of a chair earlier today.  He felt a pop and went to the ground.  He had denied any other injury when presenting to the ER.  Severe pain in the left hip.  Came into the ER x-rays confirmed dislocation.  He is now about 6 weeks out from anterior total hip arthroplasty by Dr. Linna Caprice.  Currently denies any numbness or tingling.  Was doing just fine prior to today.  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   History of kidney stones 03/2013   Lithotrispy   Hyperlipidemia    Hypertension    Hypogonadism in male    Leg pain    While Walking   Pneumonia    Pre-diabetes    Sinus problem    Past Surgical History:  Procedure Laterality Date   ABDOMINAL EXPOSURE N/A 07/30/2021   Procedure: ABDOMINAL EXPOSURE;  Surgeon: Leonie Douglas, MD;  Location: [facility] OR;  Service: Vascular;  Laterality: N/A;   ANTERIOR CERVICAL DECOMPRESSION/DISCECTOMY FUSION 4 LEVELS N/A 06/30/2012   Procedure: ANTERIOR CERVICAL DECOMPRESSION/DISCECTOMY FUSION 4 LEVELS;  Surgeon: Barnett Abu, MD;  Location: [facility];  Service: Neurosurgery;  Laterality: N/A;  C3-4 C4-5 C5-6 C6-7 Anterior cervical decompression/diskectomy/fusion   ANTERIOR LUMBAR FUSION N/A 07/30/2021   Procedure: LUMBAR FIVE-SACRAL ONE ANTERIOR LUMBAR INTERBODY FUSION;  Surgeon: Barnett Abu, MD;  Location: [facility] OR;  Service: Neurosurgery;  Laterality: N/A;   APPLICATION OF ROBOTIC  ASSISTANCE FOR SPINAL PROCEDURE N/A 06/26/2020   Procedure: APPLICATION OF ROBOTIC ASSISTANCE FOR SPINAL PROCEDURE;  Surgeon: Barnett Abu, MD;  Location: [facility] OR;  Service: Neurosurgery;  Laterality: N/A;   BACK SURGERY  08/2010   lumbar fusion   CATARACT EXTRACTION, BILATERAL     CHOLECYSTECTOMY  1990's   COLONOSCOPY  02/04/2015   NECK SURGERY     x2   sinus surgeries     x2   SKIN CANCER EXCISION     arm chest and head basal cell   TOTAL HIP ARTHROPLASTY Left 09/15/2022   Procedure: TOTAL HIP ARTHROPLASTY ANTERIOR APPROACH;  Surgeon: Samson Frederic, MD;  Location: [facility];  Service: Orthopedics;  Laterality: Left;  150   TRANSFORAMINAL LUMBAR INTERBODY FUSION (TLIF) WITH PEDICLE SCREW FIXATION 1 LEVEL N/A 06/03/2022   Procedure: T8-9 TLIF;  Surgeon: Barnett Abu, MD;  Location: [facility];  Service: Neurosurgery;  Laterality: N/A;  RM 20 3C   TRANSURETHRAL RESECTION OF PROSTATE  1999   Social History   Socioeconomic History   Marital status: Married    Spouse name: Eber Jones   Number of children: 1   Years of education: Not on file   Highest education level: Not on file  Occupational History   Occupation: retired  Tobacco Use   Smoking status: Never   Smokeless tobacco: Never  Vaping Use   Vaping status: Never Used  Substance and Sexual Activity   Alcohol use: Yes    Alcohol/week: 7.0 standard drinks of alcohol    Types: 7 Cans of beer per week   Drug use: No   Sexual activity: Not on file  Other Topics Concern   Not on file  Social History Narrative   ** Merged History Encounter **       Social Determinants of Health   Financial Resource Strain: Not on file  Food Insecurity: Not on file  Transportation Needs: Not on file  Physical Activity: Not on file  Stress: Not on file  Social Connections: Not on file   Family History  Problem Relation Age of Onset   Stroke Mother    Heart disease Mother    Emphysema Father    Heart disease Sister    Tongue cancer Brother     Heart disease Brother    Colon cancer Neg Hx    Colon polyps Neg Hx    Esophageal cancer Neg Hx    Rectal cancer Neg Hx    Stomach cancer Neg Hx    Allergies  Allergen Reactions   Aspirin Other (See Comments)    Jittery if taking large quantities   Prior to Admission medications   Medication Sig Start Date End Date Taking? Authorizing Provider  acetaminophen (TYLENOL) 500 MG tablet Take 1,000 mg by mouth every 6 (six) hours as needed for moderate pain.   Yes [provider]  ascorbic acid (VITAMIN C) 500 MG tablet Take 500 mg by mouth 2 (two) times daily.   Yes [provider]  b complex vitamins capsule Take 1 capsule by mouth 2 (two) times daily.   Yes [provider]  Calcium Carbonate (CALCIUM 500 PO) Take 500 mg by mouth daily.   Yes [provider]  cetirizine (ZYRTEC) 10 MG tablet Take 10 mg by mouth daily as needed for allergies.   Yes [provider]  Coenzyme Q10 (COQ10) 100 MG CAPS Take 100 mg by mouth daily.   Yes [provider]  Cranberry 4200 MG CAPS Take 4,200 mg by mouth daily.   Yes [provider]  docusate sodium (COLACE) 100 MG capsule Take 1 capsule (100 mg) by mouth 2 times daily. 09/15/22 11/14/22 Yes Swinteck, Arlys John, MD  doxazosin (CARDURA) 4 MG tablet Take 4 mg by mouth daily.    Yes [provider]  Echinacea 400 MG CAPS Take 400 mg by mouth daily.   Yes [provider]  esomeprazole (NEXIUM) 20 MG capsule Take 20 mg by mouth daily with breakfast.   Yes [provider]  Flaxseed, Linseed, (FLAX SEED OIL) 1000 MG CAPS Take 1,000 mg by mouth daily.   Yes [provider]  fluticasone (FLONASE) 50 MCG/ACT nasal spray Place 2 sprays into both nostrils daily as needed for allergies. 01/11/13  Yes [provider]  gabapentin (NEURONTIN) 300 MG capsule Take 300-600 mg by mouth See admin instructions. Take 300 mg in the morning and 600 mg at night   Yes [provider]  Garlic 1000 MG CAPS Take 1,000 mg by mouth 2 (two) times daily.   Yes [provider]  GLUCOSAMINE-CHONDROITIN PO Take 1 tablet by mouth daily.   Yes [provider]  guaifenesin (HUMIBID E) 400 MG TABS tablet Take 400 mg by mouth 3 (three) times daily as needed (allergies).   Yes [provider]  HYDROcodone-acetaminophen (NORCO) 7.5-325 MG  tablet Take 1 tablet by mouth every 12 (twelve) hours as needed.   Yes [provider]  lisinopril (PRINIVIL,ZESTRIL) 20 MG tablet Take 20 mg by mouth daily.   Yes [provider]  lovastatin (MEVACOR) 40 MG tablet Take 40 mg by mouth daily.   Yes [provider]  Magnesium 250 MG TABS Take 250 mg by mouth 2 (two) times daily.   Yes [provider]  meloxicam (MOBIC) 7.5 MG tablet Take 7.5 mg by mouth 2 (two) times daily.   Yes [provider]  methocarbamol (ROBAXIN) 500 MG tablet Take 1 tablet (500 mg total) by mouth every 6 (six) hours as needed for muscle spasms. 06/04/22  Yes Barnett Abu, MD  niacin 500 MG tablet Take 500 mg by mouth daily.   Yes [provider]  OVER THE COUNTER MEDICATION Take 1 capsule by mouth 2 (two) times daily. Juice Festiv supplement   Yes [provider]  Polyethyl Glycol-Propyl Glycol 0.4-0.3 % SOLN Apply 1 drop to eye at bedtime as needed (dry eyes).   Yes [provider]  polyethylene glycol powder (MIRALAX) 17 GM/SCOOP powder Take 17 g by mouth daily as needed for moderate constipation or severe constipation. 09/15/22  Yes Swinteck, Arlys John, MD  Potassium 99 MG TABS Take 99 mg by mouth 2 (two) times daily.   Yes [provider]  Probiotic Product (PRO-BIOTIC BLEND PO) Take 1 capsule by mouth daily.   Yes [provider]  pyridOXINE (VITAMIN B6) 100 MG tablet Take 100 mg by mouth daily.   Yes [provider]  senna (SENOKOT) 8.6 MG TABS tablet Take 2 tablets by mouth at bedtime. 09/15/22 11/14/22  Yes Swinteck, Arlys John, MD  Simethicone (GAS-X PO) Take 1 capsule by mouth 3 (three) times daily as needed (bloating). Uses [employer] brand   Yes [provider]  testosterone cypionate (DEPOTESTOSTERONE CYPIONATE) 200 MG/ML injection Inject 200 mg into the muscle every 28 (twenty-eight) days. For IM use only   Yes [provider]  TURMERIC PO Take 800 mg by mouth 2 (two) times daily.   Yes [provider]  vitamin B-12 (CYANOCOBALAMIN) 500 MCG tablet Take 500 mcg by mouth 2 (two) times daily.   Yes [provider]  Vitamin D, Cholecalciferol, 10 MCG (400 UNIT) CAPS Take 400 Units by mouth daily.   Yes [provider]  Vitamin E 268 MG (400 UNIT) CAPS Take 400 Units by mouth daily.   Yes [provider]  zinc gluconate 50 MG tablet Take 50 mg by mouth 2 (two) times daily.   Yes [provider]  zolpidem (AMBIEN) 10 MG tablet Take 5 mg by mouth at bedtime as needed for sleep. 04/13/22  Yes [provider]   DG Hip [place] Pelvis 1 View Left  Result Date: 10/31/2022 CLINICAL DATA:  Reduction of dislocated hip prosthesis EXAM: DG HIP (WITH OR WITHOUT PELVIS) 1V PORT LEFT COMPARISON:  10/31/2022, 2:05 p.m. FINDINGS: Persistent superior and presumably posterior dislocation of left hip total arthroplasty seen in single frontal view only. No perihardware fracture. IMPRESSION: Persistent superior and presumably posterior dislocation of left hip total arthroplasty seen in single frontal view only. No perihardware fracture. Electronically Signed   By: Jearld Lesch M.D.   On: 10/31/2022 16:36   DG Hip Unilat With Pelvis 2-3 Views Left  Result Date: 10/31/2022 CLINICAL DATA:  Left hip pain, status post bending over to pick up something from ground today. Total  hip arthroplasty 6 weeks ago. Dislocation at least 2 loose loosening or everything dislocation dislocation this is encrusting interesting EXAM: DG HIP (WITH OR WITHOUT PELVIS)  2-3V LEFT COMPARISON:  Radiographs dated September 14, 2021 FINDINGS: The femoral component is dislocated and is projected superiorly out of the acetabular component. Findings are suspicious for posterior dislocation. No appreciable fracture. IMPRESSION: Dislocated left hip prosthesis. No appreciable fracture. Electronically Signed   By: Larose Hires D.O.   On: 10/31/2022 14:47    Positive ROS: All other systems have been reviewed and were otherwise negative with the exception of those mentioned in the HPI and as above.  Physical Exam: General: Alert, no acute distress Cardiovascular: No pedal edema Respiratory: No cyanosis, no use of accessory musculature GI: No organomegaly, abdomen is soft and non-tender Skin: No lesions in the area of chief complaint Neurologic: Sensation intact distally Psychiatric: Patient is competent for consent with normal mood and affect Lymphatic: No axillary or cervical lymphadenopathy  MUSCULOSKELETAL: LLE - NVI, ER noted but not short   Assessment: Left prosthetic hip dislocation  Plan: - plan for CRED now.  - if unsuccessful will need CROR with relaxation  - keep NPO.    Yolonda Kida, MD Cell [phone]    10/31/2022 4:52 PM on

## 2022-10-31 NOTE — Anesthesia Procedure Notes (Addendum)
Procedure Name: General with mask airway Date/Time: 10/31/2022 7:21 PM  Performed by: Ludwig Lean, CRNAPre-anesthesia Checklist: Patient identified, Emergency Drugs available, Suction available and Patient being monitored Patient Re-evaluated:Patient Re-evaluated prior to induction Oxygen Delivery Method: Circle system utilized Preoxygenation: Pre-oxygenation with 100% oxygen Induction Type: IV induction Ventilation: Mask ventilation without difficulty and Oral airway inserted - appropriate to patient size Placement Confirmation: positive ETCO2 and breath sounds checked- equal and bilateral Dental Injury: Teeth and Oropharynx as per pre-operative assessment

## 2022-10-31 NOTE — Op Note (Signed)
Date of Surgery: 10/31/2022  INDICATIONS: Daniel Wagner is a 82 y.o.-year-old male with a left dislocation of a 34-week old anterior total hip arthroplasty.  An attempt was made in the ER by the EDP as well as by myself and my partner Dr. Samson Frederic to perform close reduction in the ER.  This was unsuccessful.  He was indicated for a general anesthetic and reduction maneuver.;  The patient did consent to the procedure after discussion of the risks and benefits.  PREOPERATIVE DIAGNOSIS: Left total hip arthroplasty dislocation  POSTOPERATIVE DIAGNOSIS: Same.  PROCEDURE: Closed reduction left total hip arthroplasty  SURGEON: Maryan Rued, M.D.  ASSIST: Samson Frederic, MD.  Assistant/Co. Surgeon attestation:  Dr. Linna Caprice was present due to this being outside of my area of expertise.  He is a arthroplasty trained fellowship orthopedic surgeon.  He was present for the entire procedure.  ANESTHESIA:  general  IV FLUIDS AND URINE: See anesthesia.  ESTIMATED BLOOD LOSS: 0 mL.  IMPLANTS: None  DRAINS: None  COMPLICATIONS: None.  DESCRIPTION OF PROCEDURE: The patient was brought to the operating room and placed supine on the operating table.  The patient had been signed prior to the procedure and this was documented. The patient had the anesthesia placed by the anesthesiologist.  A time-out was performed to confirm that this was the correct patient, site, side and location.   Following administration of succinylcholine by our anesthesia colleagues we attempted a standard reduction maneuver.  We were unsuccessful initially and a crosstable lateral portable x-ray confirmed that with hip flexion internal rotation and traction.  We did note on this lateral film that this was in fact anterior dislocated which was not with the initial ED x-rays that demonstrated on their lateral.  At this time elected to bring the Hana bed in such that we could perform a proper reduction maneuver for an anteriorly  dislocated hip arthroplasty.  Once the patient was then placed under general anesthetic with an LMA we moved him over to the Hana bed and performed inline traction, external rotation, and gentle downward pressure on the femoral head component which was palpated and anterior subcutaneous tissues of the left hip.  I did have Dr. Linna Caprice present for his expertise in hip arthroplasty.  We had felt a palpable reduction and also reassess that the rotation of the hip was symmetric with the right and leg lengths appeared appropriate.  We then brought in the portable x-ray for an AP pelvis that did confirm concentrically reduced left total hip arthroplasty.  He was allowed to emerge from general anesthesia with no noted complications transferred back to the stretcher in stable condition and transported to PACU.  All counts were correct.  POSTOPERATIVE PLAN:   Daniel Wagner will be discharged home today from PACU.  He will be weightbearing as tolerated with a knee immobilizer which was placed intraoperatively.  He will call the office tomorrow where Dr. Linna Caprice will have him converted to a more proper brace to be worn for 4 to 6 weeks.

## 2022-10-31 NOTE — Discharge Instructions (Signed)
Orthopedic discharge instructions:  -Mr. Daniel Wagner will be appropriate for full weightbearing as tolerated to the left lower extremity.  -Apply ice to the left hip for 20 to 30 minutes each hour that you are awake.  -Maintain the knee immobilizer in place at all times.  You will present to see Dr. Kathline Magic nurse tomorrow, 11/01/2022 for conversion to a specific brace that Dr. Linna Caprice is recommended.

## 2022-10-31 NOTE — ED Provider Notes (Signed)
  Received signout; Left hip dislocation; recent replacement on 6/5 with Dr. Wille Celeste with [facility]. Unsuccessful reduction in the ED. Dispo per Ortho.   Physical Exam  BP (!) 149/79   Pulse 75   Temp 97.6 F (36.4 C)   Resp 13   Wt 84 kg   SpO2 100%   BMI 25.12 kg/m   Procedures    ED Course / MDM   Clinical Course as of 10/31/22 2024  Sun Oct 31, 2022  1450 XR with L hip dislocation. Will require reduction. [VK]  1548 Attempted reduction unsuccessful. Orthopedics will be called. Patient signed out to Dr. Maple Hudson pending ortho eval. [VK]  1606 Received signout; Ortho has been contacted, they are looking at x-rays and will discuss case further. [TY]  1611 Ortho returned call; will take to OR for reduction.  [TY]  1632 Ortho called; no OR availability until late this evening. Would like to attempt reduction in ED. Patient re consented.  [TY]  [id number] Patient consented underwent procedural sedation again with Ortho at bedside performing reduction.  Awaiting x-ray [TY]  1744 Xray with unsuccessful reduction; will again plan for OR later this evening.  [TY]    Clinical Course User Index [TY] Coral Spikes, DO [VK] Rexford Maus, DO   Medical Decision Making Amount and/or Complexity of Data Reviewed Radiology: ordered.  Risk Prescription drug management.        Coral Spikes, DO 10/31/22 2024

## 2022-10-31 NOTE — Anesthesia Procedure Notes (Addendum)
Procedure Name: LMA Insertion Date/Time: 10/31/2022 7:31 PM  Performed by: Ludwig Lean, CRNAPre-anesthesia Checklist: Patient identified, Emergency Drugs available, Suction available and Patient being monitored Patient Re-evaluated:Patient Re-evaluated prior to induction Oxygen Delivery Method: Circle system utilized Preoxygenation: Pre-oxygenation with 100% oxygen Induction Type: Inhalational induction Number of attempts: 1 Placement Confirmation: positive ETCO2 and breath sounds checked- equal and bilateral Tube secured with: Tape Dental Injury: Injury to lip  Comments: Small scab present to upper middle lip preop, scab bleeding after LMA insertion

## 2022-10-31 NOTE — ED Triage Notes (Signed)
EMS reports from home, unwitnessed fall, denies blood thinners, no LOC, no head strike. Pt c/o left hip pain, Pt states possible hip dislocation. Pt arrives with abrasion to left elbow. Pt states he had left hip replacement 7 weeks ago. Hx of Back surgeries.   BP 110/96 HR 86 RR 17 Sp02 96 RA

## 2022-10-31 NOTE — Transfer of Care (Signed)
Immediate Anesthesia Transfer of Care Note  Patient: Daniel Wagner  Procedure(s) Performed: Procedure(s): CLOSED REDUCTION HIP (Left)  Patient Location: PACU  Anesthesia Type:General  Level of Consciousness: Patient easily awoken, sedated, comfortable, cooperative, following commands, responds to stimulation.   Airway & Oxygen Therapy: Patient spontaneously breathing, ventilating well, oxygen via simple oxygen mask.  Post-op Assessment: Report given to PACU RN, vital signs reviewed and stable, moving all extremities.   Post vital signs: Reviewed and stable.  Complications: No apparent anesthesia complications Last Vitals:  Vitals Value Taken Time  BP 140/77 10/31/22 1954  Temp    Pulse 83 10/31/22 1956  Resp 16 10/31/22 1956  SpO2 100 % 10/31/22 1956  Vitals shown include unfiled device data.  Last Pain:  Vitals:   10/31/22 1838  TempSrc:   PainSc: 10-Worst pain ever         Complications: No notable events documented.

## 2022-10-31 NOTE — Brief Op Note (Signed)
10/31/2022  7:45 PM  PATIENT:  Gavin Pound  82 y.o. male  PRE-OPERATIVE DIAGNOSIS:  DISLOCATED LEFT HIP  POST-OPERATIVE DIAGNOSIS:  Dislocated left hip  PROCEDURE:  Procedure(s): CLOSED REDUCTION HIP (Left)  SURGEON:  Surgeons and Role:    * Aundria Rud, Noah Delaine, MD - Primary    * Swinteck, Arlys John, MD - Assisting  PHYSICIAN ASSISTANT:   ASSISTANTS: none   ANESTHESIA:   general  EBL:  0 cc  BLOOD ADMINISTERED:none  DRAINS: none   LOCAL MEDICATIONS USED:  NONE  SPECIMEN:  No Specimen  DISPOSITION OF SPECIMEN:  N/A  COUNTS:  YES  TOURNIQUET:  * No tourniquets in log *  DICTATION: .Note written in EPIC  PLAN OF CARE: Discharge to home after PACU  PATIENT DISPOSITION:  PACU - hemodynamically stable.   Delay start of Pharmacological VTE agent (>24hrs) due to surgical blood loss or risk of bleeding: not applicable

## 2022-10-31 NOTE — Progress Notes (Signed)
Pt said no problems of DM, His HgA1C was 5.8, not checking cbgs at home.

## 2022-10-31 NOTE — ED Provider Notes (Signed)
[name] [facility] AT [facility] Provider Note   CSN: [id number] Arrival date & time: 10/31/22  1330     History  Chief Complaint  Patient presents with   Fall   Hip Pain    Daniel Wagner is a 82 y.o. male.  Patient is an 82 year old male with a past medical history of hip replacement about 6 weeks ago on the left presenting to the emergency department with left hip pain.  Patient reports that he felt a pop in his left hip that caused him to fall to the ground and he landed on his bilateral elbows.  He denies hitting his head or losing consciousness.  He states that since then he has had severe pain in his hip and is concerned he may have dislocated it.  He denies any numbness or weakness.  He denies any other pain or injuries from the fall.  He denies any blood thinner use.  The history is provided by the patient.  Fall  Hip Pain       Home Medications Prior to Admission medications   Medication Sig Start Date End Date Taking? Authorizing Provider  acetaminophen (TYLENOL) 500 MG tablet Take 1,000 mg by mouth every 6 (six) hours as needed for moderate pain.   Yes [provider]  ascorbic acid (VITAMIN C) 500 MG tablet Take 500 mg by mouth 2 (two) times daily.   Yes [provider]  b complex vitamins capsule Take 1 capsule by mouth 2 (two) times daily.   Yes [provider]  Calcium Carbonate (CALCIUM 500 PO) Take 500 mg by mouth daily.   Yes [provider]  cetirizine (ZYRTEC) 10 MG tablet Take 10 mg by mouth daily as needed for allergies.   Yes [provider]  Coenzyme Q10 (COQ10) 100 MG CAPS Take 100 mg by mouth daily.   Yes [provider]  Cranberry 4200 MG CAPS Take 4,200 mg by mouth daily.   Yes [provider]  docusate sodium (COLACE) 100 MG capsule Take 1 capsule (100 mg) by mouth 2 times daily. 09/15/22 11/14/22 Yes Swinteck, Arlys John, MD  doxazosin (CARDURA) 4 MG tablet Take 4 mg by  mouth daily.    Yes [provider]  Echinacea 400 MG CAPS Take 400 mg by mouth daily.   Yes [provider]  esomeprazole (NEXIUM) 20 MG capsule Take 20 mg by mouth daily with breakfast.   Yes [provider]  Flaxseed, Linseed, (FLAX SEED OIL) 1000 MG CAPS Take 1,000 mg by mouth daily.   Yes [provider]  fluticasone (FLONASE) 50 MCG/ACT nasal spray Place 2 sprays into both nostrils daily as needed for allergies. 01/11/13  Yes [provider]  gabapentin (NEURONTIN) 300 MG capsule Take 300-600 mg by mouth See admin instructions. Take 300 mg in the morning and 600 mg at night   Yes [provider]  Garlic 1000 MG CAPS Take 1,000 mg by mouth 2 (two) times daily.   Yes [provider]  GLUCOSAMINE-CHONDROITIN PO Take 1 tablet by mouth daily.   Yes [provider]  guaifenesin (HUMIBID E) 400 MG TABS tablet Take 400 mg by mouth 3 (three) times daily as needed (allergies).   Yes [provider]  HYDROcodone-acetaminophen (NORCO) 7.5-325 MG tablet Take 1 tablet by mouth every 12 (twelve) hours as needed.   Yes [provider]  lisinopril (PRINIVIL,ZESTRIL) 20 MG tablet Take 20 mg by mouth daily.   Yes  [provider]  lovastatin (MEVACOR) 40 MG tablet Take 40 mg by mouth daily.   Yes [provider]  Magnesium 250 MG TABS Take 250 mg by mouth 2 (two) times daily.   Yes [provider]  meloxicam (MOBIC) 7.5 MG tablet Take 7.5 mg by mouth 2 (two) times daily.   Yes [provider]  methocarbamol (ROBAXIN) 500 MG tablet Take 1 tablet (500 mg total) by mouth every 6 (six) hours as needed for muscle spasms. 06/04/22  Yes Barnett Abu, MD  niacin 500 MG tablet Take 500 mg by mouth daily.   Yes [provider]  OVER THE COUNTER MEDICATION Take 1 capsule by mouth 2 (two) times daily. Ju[employer] supplement   Yes [provider]  Polyethyl Glycol-Propyl Glycol  0.4-0.3 % SOLN Apply 1 drop to eye at bedtime as needed (dry eyes).   Yes [provider]  polyethylene glycol powder (MIRALAX) 17 GM/SCOOP powder Take 17 g by mouth daily as needed for moderate constipation or severe constipation. 09/15/22  Yes Swinteck, Arlys John, MD  Potassium 99 MG TABS Take 99 mg by mouth 2 (two) times daily.   Yes [provider]  Probiotic Product (PRO-BIOTIC BLEND PO) Take 1 capsule by mouth daily.   Yes [provider]  pyridOXINE (VITAMIN B6) 100 MG tablet Take 100 mg by mouth daily.   Yes [provider]  senna (SENOKOT) 8.6 MG TABS tablet Take 2 tablets by mouth at bedtime. 09/15/22 11/14/22 Yes Swinteck, Arlys John, MD  Simethicone (GAS-X PO) Take 1 capsule by mouth 3 (three) times daily as needed (bloating). Uses [employer] brand   Yes [provider]  testosterone cypionate (DEPOTESTOSTERONE CYPIONATE) 200 MG/ML injection Inject 200 mg into the muscle every 28 (twenty-eight) days. For IM use only   Yes [provider]  TURMERIC PO Take 800 mg by mouth 2 (two) times daily.   Yes [provider]  vitamin B-12 (CYANOCOBALAMIN) 500 MCG tablet Take 500 mcg by mouth 2 (two) times daily.   Yes [provider]  Vitamin D, Cholecalciferol, 10 MCG (400 UNIT) CAPS Take 400 Units by mouth daily.   Yes [provider]  Vitamin E 268 MG (400 UNIT) CAPS Take 400 Units by mouth daily.   Yes [provider]  zinc gluconate 50 MG tablet Take 50 mg by mouth 2 (two) times daily.   Yes [provider]  zolpidem (AMBIEN) 10 MG tablet Take 5 mg by mouth at bedtime as needed for sleep. 04/13/22  Yes [provider]      Allergies    Aspirin    Review of Systems   Review of Systems  Physical Exam Updated Vital Signs BP 132/64   Pulse 77   Temp 98 F (36.7 C) (Oral)   Resp 13   Wt 84 kg   SpO2 98%   BMI 25.12 kg/m  Physical Exam Vitals and nursing note reviewed.  Constitutional:       General: He is in acute distress (Crying in pain).     Appearance: Normal appearance. He is not ill-appearing.  HENT:     Head: Normocephalic and atraumatic.     Nose: Nose normal.     Mouth/Throat:     Mouth: Mucous membranes are moist.     Pharynx: Oropharynx is clear.  Eyes:     Extraocular Movements: Extraocular movements intact.     Conjunctiva/sclera: Conjunctivae normal.  Cardiovascular:     Rate and Rhythm:  Normal rate and regular rhythm.     Pulses: Normal pulses.     Heart sounds: Normal heart sounds.  Pulmonary:     Effort: Pulmonary effort is normal.     Breath sounds: Normal breath sounds.  Abdominal:     General: Abdomen is flat.     Palpations: Abdomen is soft.     Tenderness: There is no abdominal tenderness.  Musculoskeletal:     Cervical back: Normal range of motion and neck supple.     Comments: No bony tenderness to bilateral upper extremities, tenderness to palpation of left hip, laying on his right side with left hip flexed and unwilling to try to range his leg secondary to pain  Skin:    General: Skin is warm and dry.     Comments: Nonbleeding abrasions to bilateral elbows  Neurological:     General: No focal deficit present.     Mental Status: He is alert and oriented to person, place, and time.  Psychiatric:        Mood and Affect: Mood normal.        Behavior: Behavior normal.     ED Results / Procedures / Treatments   Labs (all labs ordered are listed, but only abnormal results are displayed) Labs Reviewed - No data to display  EKG None  Radiology DG Hip Unilat With Pelvis 2-3 Views Left  Result Date: 10/31/2022 CLINICAL DATA:  Left hip pain, status post bending over to pick up something from ground today. Total hip arthroplasty 6 weeks ago. Dislocation at least 2 loose loosening or everything dislocation dislocation this is encrusting interesting EXAM: DG HIP (WITH OR WITHOUT PELVIS) 2-3V LEFT COMPARISON:  Radiographs dated September 14, 2021  FINDINGS: The femoral component is dislocated and is projected superiorly out of the acetabular component. Findings are suspicious for posterior dislocation. No appreciable fracture. IMPRESSION: Dislocated left hip prosthesis. No appreciable fracture. Electronically Signed   By: Larose Hires D.O.   On: 10/31/2022 14:47    Procedures .Ortho Injury Treatment  Date/Time: 10/31/2022 3:48 PM  Performed by: Rexford Maus, DO Authorized by: Rexford Maus, DO   Consent:    Consent obtained:  Written   Consent given by:  Patient   Risks discussed:  Fracture, irreducible dislocation, nerve damage, recurrent dislocation, restricted joint movement and stiffness   Alternatives discussed:  No treatmentInjury location: hip Location details: left hip Injury type: dislocation Dislocation type: posterior Spontaneous dislocation: yes Prosthesis: yes Pre-procedure neurovascular assessment: neurovascularly intact Pre-procedure distal perfusion: normal Pre-procedure neurological function: normal Pre-procedure range of motion: reduced  Anesthesia: Local anesthesia used: no  Patient sedated: Yes. Refer to sedation procedure documentation for details of sedation. Manipulation performed: yes Reduction successful: no Post-procedure neurovascular assessment: post-procedure neurovascularly intact Post-procedure distal perfusion: normal Post-procedure neurological function: normal Post-procedure range of motion: unchanged   .Sedation  Date/Time: 10/31/2022 3:50 PM  Performed by: Rexford Maus, DO Authorized by: Rexford Maus, DO   Consent:    Consent obtained:  Written   Consent given by:  Patient   Risks discussed:  Allergic reaction, prolonged hypoxia resulting in organ damage, prolonged sedation necessitating reversal, inadequate sedation, respiratory compromise necessitating ventilatory assistance and intubation, nausea and vomiting   Alternatives discussed:  Analgesia  without sedation and anxiolysis Universal protocol:    Immediately prior to procedure, a time out was called: yes     Patient identity confirmed:  Verbally with patient Indications:    Procedure performed:  Dislocation reduction  Procedure necessitating sedation performed by:  Physician performing sedation Pre-sedation assessment:    Time since last food or drink:  4   ASA classification: class 2 - patient with mild systemic disease     Mouth opening:  3 or more finger widths   Thyromental distance:  4 finger widths   Mallampati score:  III - soft palate, base of uvula visible   Neck mobility: normal     Pre-sedation assessments completed and reviewed: pre-procedure airway patency not reviewed, pre-procedure cardiovascular function not reviewed, pre-procedure hydration status not reviewed, pre-procedure mental status not reviewed, pre-procedure nausea and vomiting status not reviewed, pre-procedure pain level not reviewed, pre-procedure respiratory function not reviewed and pre-procedure temperature not reviewed   Immediate pre-procedure details:    Reassessment: Patient reassessed immediately prior to procedure     Reviewed: vital signs, relevant labs/tests and NPO status     Verified: bag valve mask available, emergency equipment available, intubation equipment available, IV patency confirmed, oxygen available, reversal medications available and suction available   Procedure details (see MAR for exact dosages):    Preoxygenation:  Nasal cannula   Sedation:  Propofol   Intended level of sedation: deep   Analgesia:  Morphine   Intra-procedure monitoring:  Cardiac monitor, blood pressure monitoring, continuous capnometry, frequent LOC assessments, frequent vital sign checks and continuous pulse oximetry   Intra-procedure events: hypoxia     Intra-procedure management:  Airway repositioning and supplemental oxygen   Total Provider sedation time (minutes):  50 Post-procedure details:     Attendance: Constant attendance by certified staff until patient recovered     Recovery: Patient returned to pre-procedure baseline     Post-sedation assessments completed and reviewed: post-procedure airway patency not reviewed, post-procedure cardiovascular function not reviewed, post-procedure hydration status not reviewed, post-procedure mental status not reviewed, post-procedure nausea and vomiting status not reviewed, pain score not reviewed, post-procedure respiratory function not reviewed and post-procedure temperature not reviewed     Patient is stable for discharge or admission: yes     Procedure completion:  Tolerated well, no immediate complications     Medications Ordered in ED Medications  Tdap (BOOSTRIX) injection 0.5 mL (has no administration in time range)  propofol (DIPRIVAN) 10 mg/mL bolus/IV push 42 mg (has no administration in time range)  morphine (PF) 4 MG/ML injection 4 mg (4 mg Intravenous Given 10/31/22 1357)  propofol (DIPRIVAN) 10 mg/mL bolus/IV push 42 mg (42 mg Intravenous Given 10/31/22 1535)  propofol (DIPRIVAN) 10 mg/mL bolus/IV push 42 mg (80 mg Intravenous Given 10/31/22 1521)    ED Course/ Medical Decision Making/ A&P Clinical Course as of 10/31/22 1552  Sun Oct 31, 2022  1450 XR with L hip dislocation. Will require reduction. [VK]  1548 Attempted reduction unsuccessful. Orthopedics will be called. Patient signed out to Dr. Maple Hudson pending ortho eval. [VK]    Clinical Course User Index [VK] Rexford Maus, DO                             Medical Decision Making This patient presents to the ED with chief complaint(s) of L hip pain with pertinent past medical history of recent L hip replacement which further complicates the presenting complaint. The complaint involves an extensive differential diagnosis and also carries with it a high risk of complications and morbidity.    The differential diagnosis includes fracture, dislocation, muscle strain or  spasm  Additional history obtained: Additional history obtained from  EMS  Records reviewed previous admission documents and outpatient orthopedic records  ED Course and Reassessment: On patient's arrival to the emergency department he is hemodynamically stable though complaining of significant pain.  His left leg is neurovascularly intact though range of motion is limited secondary to pain and will have x-ray performed to evaluate for fracture or dislocation.  He did have abrasions to his elbow but no other traumatic injuries and no bony tenderness in the upper extremities.  He had no presyncopal symptoms making a syncopal fall unlikely.  Patient will be given morphine for pain control and will be closely reassessed.  Independent labs interpretation:  N/A  Independent visualization of imaging: - I independently visualized the following imaging with scope of interpretation limited to determining acute life threatening conditions related to emergency care: L hip XR, which revealed posterior L hip dislocation  Consultation: - Consulted or discussed management/test interpretation w/ external professional: orthopedics     Amount and/or Complexity of Data Reviewed Radiology: ordered.  Risk Prescription drug management.          Final Clinical Impression(s) / ED Diagnoses Final diagnoses:  Abrasion of elbow, unspecified laterality, initial encounter    Rx / DC Orders ED Discharge Orders     None         Rexford Maus, DO 10/31/22 1552

## 2022-11-01 ENCOUNTER — Encounter (HOSPITAL_COMMUNITY): Payer: Self-pay | Admitting: Orthopedic Surgery

## 2022-11-01 ENCOUNTER — Ambulatory Visit
Admission: RE | Admit: 2022-11-01 | Discharge: 2022-11-01 | Disposition: A | Discharge status: Home / Self Care | Payer: Medicare Other | Source: Ambulatory Visit | Attending: Student | Admitting: Student

## 2022-11-01 DIAGNOSIS — Z96642 Presence of left artificial hip joint: Secondary | ICD-10-CM

## 2022-11-01 DIAGNOSIS — Z96611 Presence of right artificial shoulder joint: Secondary | ICD-10-CM

## 2022-11-04 NOTE — Anesthesia Postprocedure Evaluation (Signed)
Anesthesia Post Note  Patient: Daniel Wagner  Procedure(s) Performed: CLOSED REDUCTION HIP (Left: Hip)     Patient location during evaluation: PACU Anesthesia Type: General Level of consciousness: awake and alert Pain management: pain level controlled Vital Signs Assessment: post-procedure vital signs reviewed and stable Respiratory status: spontaneous breathing, nonlabored ventilation, respiratory function stable and patient connected to nasal cannula oxygen Cardiovascular status: blood pressure returned to baseline and stable Postop Assessment: no apparent nausea or vomiting Anesthetic complications: no   No notable events documented.  Last Vitals:  Vitals:   10/31/22 2015 10/31/22 2017  BP: (!) 149/79 (!) 148/78  Pulse: 75 77  Resp: 13 14  Temp: 36.4 C   SpO2: 100% 100%    Last Pain:  Vitals:   10/31/22 2012  TempSrc:   PainSc: 0-No pain                 Earl Lites P Jessee Mezera

## 2022-12-29 ENCOUNTER — Ambulatory Visit (INDEPENDENT_AMBULATORY_CARE_PROVIDER_SITE_OTHER): Payer: Medicare Other | Admitting: Orthopaedic Surgery

## 2022-12-29 ENCOUNTER — Other Ambulatory Visit (INDEPENDENT_AMBULATORY_CARE_PROVIDER_SITE_OTHER): Payer: Medicare Other

## 2022-12-29 DIAGNOSIS — Z96642 Presence of left artificial hip joint: Secondary | ICD-10-CM

## 2022-12-29 NOTE — Progress Notes (Signed)
The patient is an 82 year old gentleman who comes in today for second opinion as a relates to his left hip.  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  This was done by one of my colleagues in town.  This was done through a bikini incision at the groin crease on the left side.  In July he unfortunately was bending over reaching for something in a bush when his left hip dislocated.  He was taken to the emergency room and ended up having to be taken to the operating room reducing the hip.  A CT scan was obtained of the hip and that showed a fracture line in the proximal femur.  He has been since told her that is healed.  He is walking without assistive device.  He did say he wore hip abduction brace for about 6 weeks.  He had now does not need to wear the brace.  He has been slowly getting back to activities.  He does have a history of multiple spine surgeries.  He said he is still having some pain and he does feel like the hip is just not right.  He has had a recent recurrence where he felt like the hip was going to come out of place.  He says it does pop on occasion.  On my exam he does have good internal and external rotation of his left operative hip.  I had him lay in a supine position and I did not feel any gross difference in the leg length.  A standing AP pelvis and lateral of his left hip today show well-seated total hip arthroplasty with no complicating fractures.  I did not see any fracture lines around the femur.  I did compare the standing x-rays today of his pelvis to his hip on x-ray back in June.  It does look like the femoral component has subsided a few millimeters.  From my standpoint, I want him to continue to go slow and adhere to strict anterior hip precautions.  If his hip does come out of place again, the recommendation at this point would likely be a constrained liner and increasing his length.  I do feel that the acetabular component is  in appropriate position and I am not sure as to why he dislocated.  The surgeon who did perform his hip replacement surgery does not not have a reputation for any a high number of dislocations at all as it relates to the anterior hip replacement surgery.  Given the fact that it was very hard to put back in place in terms of the reduction following the dislocation also shows that this seems to be an optimal position.  Of note the patient does walk upright so I do not feel that it is a spine issue.  He is also within individual.  Again if he does end up having a dislocation again, he would require likely a constrained liner and a longer hip ball.  As of now, he just needs to go slow and we will watch this with time.  We can certainly see her back in 6 weeks if he would like for repeat exam but no x-rays are needed unless there are issues.  All question concerns were answered and addressed.

## 2023-02-02 ENCOUNTER — Ambulatory Visit: Payer: Medicare Other | Admitting: Orthopaedic Surgery

## 2023-02-09 ENCOUNTER — Ambulatory Visit: Payer: Medicare Other | Admitting: Orthopaedic Surgery

## 2023-02-21 ENCOUNTER — Ambulatory Visit: Payer: Medicare Other | Admitting: Orthopaedic Surgery

## 2023-03-14 ENCOUNTER — Encounter: Payer: Self-pay | Admitting: Orthopaedic Surgery

## 2023-03-14 ENCOUNTER — Ambulatory Visit: Payer: Medicare Other | Admitting: Orthopaedic Surgery

## 2023-03-14 DIAGNOSIS — Z96642 Presence of left artificial hip joint: Secondary | ICD-10-CM

## 2023-03-14 NOTE — Progress Notes (Signed)
The patient comes in for follow-up almost 6 months out from a left total hip arthroplasty that was performed by one of my colleagues in town Dr. Linna Caprice.  This was done through a bikini approach incision.  Unfortunately 5 weeks after surgery he had a dislocation.  It was quite difficult to get reduced but they were able to do it in the operating room eventually.  He still has some symptoms as if the hip is popping or can come out of place but he has not yet.  He has some pain when he is getting up from a sitting position.  He is 82 years old.  He and his wife are scheduled to go on a cruise sometime in the next week.  Fortunately he has not had a dislocation since that 1 in June.  On exam the hip does move smoothly.  I did not elicit any types of catches or popping sensation in that left hip.  He is slightly shorter on that side.  From my standpoint he needs to still go slow and adhere to hip precautions.  We can see him back in 3 months.  At that visit we will have a standing low AP pelvis and lateral of that left operative hip.  He understands that if there is another dislocation, he would need a revision type of surgery with likely constrained liner and a longer hip ball.

## 2023-04-13 HISTORY — PX: SHOULDER SURGERY: SHX246

## 2023-04-18 ENCOUNTER — Telehealth: Payer: Self-pay

## 2023-04-18 NOTE — Telephone Encounter (Signed)
 Patient called in - DOB/Address verified - requested appointment to see Dr. Maurilio  - to f/u from his dermatology appt. - feels like he has an allergy.  Scheduled New Patient appt: 05/25/23 @ 9:30 am w/Dr. Maurilio in our [facility].  Patient verbalized understanding to all - new patient packet mailed - no further questions.

## 2023-05-25 ENCOUNTER — Ambulatory Visit: Payer: Medicare Other | Admitting: Allergy and Immunology

## 2023-05-25 ENCOUNTER — Encounter: Payer: Self-pay | Admitting: Allergy and Immunology

## 2023-05-25 VITALS — BP 122/64 | HR 94 | Resp 18 | Ht 70.0 in | Wt 182.0 lb

## 2023-05-25 DIAGNOSIS — L299 Pruritus, unspecified: Secondary | ICD-10-CM | POA: Diagnosis not present

## 2023-05-25 MED ORDER — FAMOTIDINE 20 MG PO TABS: ORAL_TABLET | ORAL | 5 refills | Status: AC

## 2023-05-25 NOTE — Progress Notes (Unsigned)
[place] - [place] - [place] - [place]   Dear Nedra Hai,  Thank you for referring Daniel Wagner to the [facility] [place] on 05/25/2023.   Below is a summation of this patient's evaluation and recommendations.  Thank you for your referral. I will keep you informed about this patient's response to treatment.   If you have any questions please do not hesitate to contact me.   Sincerely,  Jessica Priest, MD Allergy / Immunology [facility]   ______________________________________________________________________    NEW PATIENT NOTE  Referring Provider: Simone Curia, MD Primary Provider: Simone Curia, MD Date of office visit: 05/25/2023    Subjective:   Chief Complaint:  Daniel Wagner (DOB: [birth year]) is a 83 y.o. male who presents to the clinic on 05/25/2023 with a chief complaint of Pruritus .     HPI: Daniel Wagner presents to this clinic in evaluation of pruritus.  [name] developed globalized itching involving multiple areas of his body over the course of the past 6 months.  He has never noticed a dermatitis but is intensely itchy and it does disturb his sleep.  He has no associated systemic or constitutional symptoms.  There is not really an obvious provoking factor giving rise to this issue.  However, there is a temporal relationship between him starting a cayenne pepper supplement and the development of this pruritus.  Interestingly, he discontinued his cayenne pepper supplement about 2 weeks ago and he is at least 50% better.    He is currently taking a Zyrtec in the morning and taking a Benadryl at night.  He was treated with written [name] for a few months which he is not really sure helped him very much and he gave rise to bruising and a persistent cold and he discontinued this earlier this month.  He has been treated with a steroid shot which did not help him.  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   History of kidney stones 03/2013   Lithotrispy   Hyperlipidemia    Hypertension    Hypogonadism in male    Leg pain    While Walking   Pneumonia    Pre-diabetes    Sinus problem     Past Surgical History:  Procedure Laterality Date   ABDOMINAL EXPOSURE N/A 07/30/2021   Procedure: ABDOMINAL EXPOSURE;  Surgeon: Leonie Douglas, MD;  [facility]: [facility] OR;  Service: Vascular;  Laterality: N/A;   ANTERIOR CERVICAL DECOMPRESSION/DISCECTOMY FUSION 4 LEVELS N/A 06/30/2012   Procedure: ANTERIOR CERVICAL DECOMPRESSION/DISCECTOMY FUSION 4 LEVELS;  Surgeon: Barnett Abu, MD;  Location: [facility];  Service: Neurosurgery;  Laterality: N/A;  C3-4 C4-5 C5-6 C6-7 Anterior cervical decompression/diskectomy/fusion   ANTERIOR LUMBAR FUSION N/A 07/30/2021   Procedure: LUMBAR FIVE-SACRAL ONE ANTERIOR LUMBAR INTERBODY FUSION;  Surgeon: Barnett Abu, MD;  Location: [facility] OR;  Service: Neurosurgery;  Laterality: N/A;   APPLICATION OF ROBOTIC ASSISTANCE FOR SPINAL PROCEDURE N/A 06/26/2020   Procedure:  APPLICATION OF ROBOTIC ASSISTANCE FOR SPINAL PROCEDURE;  Surgeon: Barnett Abu, MD;  Location: [facility] OR;  Service: Neurosurgery;  Laterality: N/A;   BACK SURGERY  08/2010   lumbar fusion   CATARACT EXTRACTION, BILATERAL     CHOLECYSTECTOMY  1990's   COLONOSCOPY  02/04/2015   HIP CLOSED REDUCTION Left 10/31/2022   Procedure: CLOSED REDUCTION HIP;  Surgeon: Yolonda Kida, MD;  Location: [facility] ORS;  Service: Orthopedics;  Laterality: Left;   NECK SURGERY     x2   sinus surgeries     x2   SKIN CANCER EXCISION     arm chest and head basal cell   TOTAL  HIP ARTHROPLASTY Left 09/15/2022   Procedure: TOTAL HIP ARTHROPLASTY ANTERIOR APPROACH;  Surgeon: Samson Frederic, MD;  Location: [facility]S;  Service: Orthopedics;  Laterality: Left;  150   TRANSFORAMINAL LUMBAR INTERBODY FUSION (TLIF) WITH PEDICLE SCREW FIXATION 1 LEVEL N/A 06/03/2022   Procedure: T8-9 TLIF;  Surgeon: Barnett Abu, MD;  Location: [facility] [place] OR;  Service: Neurosurgery;  Laterality: N/A;  RM 20 3C   TRANSURETHRAL RESECTION OF PROSTATE  1999    Allergies as of 05/25/2023       Reactions   Aspirin Other (See Comments)   Jittery if taking large quantities        Medication List    acetaminophen 500 MG tablet Commonly known as: TYLENOL Take 1,000 mg by mouth every 6 (six) hours as needed for moderate pain.   ACIDOPHILUS PO Take by mouth.   ascorbic acid 500 MG tablet Commonly known as: VITAMIN C Take 500 mg by mouth 2 (two) times daily.   b complex vitamins capsule Take 1 capsule by mouth 2 (two) times daily.   Biotin 5000 MCG Caps Take by mouth.   BLACK ELDERBERRY PO Take by mouth.   CALCIUM 500 PO Take 500 mg by mouth daily.   CAYENNE PO Take by mouth.   cetirizine 10 MG tablet Commonly known as: ZYRTEC Take 10 mg by mouth daily as needed for allergies.   CoQ10 100 MG Caps Take 100 mg by mouth daily.   Cranberry 4200 MG Caps Take 4,200 mg by mouth daily.   doxazosin 4 MG tablet Commonly known as: CARDURA Take 4 mg by mouth daily.   Echinacea 400 MG Caps Take 400 mg by mouth daily.   esomeprazole 20 MG capsule Commonly known as: NEXIUM Take 20 mg by mouth daily with breakfast.   famotidine 20 MG tablet Commonly known as: PEPCID Take 1 tablet twice daily Started by: Amira Podolak J Montrell Cessna   Flax Seed Oil 1000 MG Caps Take 1,000 mg by mouth daily.   fluticasone 50 MCG/ACT nasal spray Commonly known as: FLONASE Place 2 sprays into both nostrils daily as needed for allergies.   gabapentin 300 MG capsule Commonly known as: NEURONTIN Take 300-600 mg by  mouth See admin instructions. Take 300 mg in the morning and 600 mg at night   Garlic 1000 MG Caps Take 1,000 mg by mouth 2 (two) times daily.   GAS-X PO Take 1 capsule by mouth 3 (three) times daily as needed (bloating). Uses [employer] brand   GLUCOSAMINE-CHONDROITIN PO Take 1 tablet by mouth daily.   guaifenesin 400 MG Tabs tablet Commonly known as: HUMIBID E Take 400 mg by mouth 3 (three) times daily as needed (allergies).   HYALURONIC ACID PO Take by mouth.   HYDROcodone-acetaminophen 7.5-325 MG tablet Commonly known as: NORCO Take 1 tablet by mouth every 12 (twelve)  hours as needed.   HYDROcodone-acetaminophen 5-325 MG tablet Commonly known as: Norco Take 1 tablet by mouth every 6 (six) hours as needed for moderate pain or severe pain.   IRON PO Take by mouth.   K2 PO Take by mouth.   lisinopril 20 MG tablet Commonly known as: ZESTRIL Take 20 mg by mouth daily.   lovastatin 40 MG tablet Commonly known as: MEVACOR Take 40 mg by mouth daily.   Magnesium 250 MG Tabs Take 250 mg by mouth 2 (two) times daily.   meloxicam 7.5 MG tablet Commonly known as: MOBIC Take 7.5 mg by mouth 2 (two) times daily.   methocarbamol 500 MG tablet Commonly known as: ROBAXIN Take 1 tablet (500 mg total) by mouth every 6 (six) hours as needed for muscle spasms.   Milk Thistle 1000 MG Caps Take by mouth.   niacin 500 MG tablet Commonly known as: VITAMIN B3 Take 500 mg by mouth daily.   Omega 3 1000 MG Caps Take by mouth.   OVER THE COUNTER MEDICATION Take 1 capsule by mouth 2 (two) times daily. Juice Festiv supplement   Polyethyl Glycol-Propyl Glycol 0.4-0.3 % Soln Apply 1 drop to eye at bedtime as needed (dry eyes).   polyethylene glycol powder 17 GM/SCOOP powder Commonly known as: MiraLax Take 17 g by mouth daily as needed for moderate constipation or severe constipation.   Potassium 99 MG Tabs Take 99 mg by mouth 2 (two) times daily.   PRO-BIOTIC BLEND PO Take  1 capsule by mouth daily.   pyridOXINE 100 MG tablet Commonly known as: VITAMIN B6 Take 100 mg by mouth daily.   testosterone cypionate 200 MG/ML injection Commonly known as: DEPOTESTOSTERONE CYPIONATE Inject 200 mg into the muscle every 28 (twenty-eight) days. For IM use only   vitamin B-12 500 MCG tablet Commonly known as: CYANOCOBALAMIN Take 500 mcg by mouth 2 (two) times daily.   Vitamin D (Cholecalciferol) 10 MCG (400 UNIT) Caps Take 400 Units by mouth daily.   Vitamin E 268 MG (400 UNIT) Caps Take 400 Units by mouth daily.   zolpidem 10 MG tablet Commonly known as: AMBIEN Take 5 mg by mouth at bedtime as needed for sleep.    Review of systems negative except as noted in HPI / PMHx or noted below:  Review of Systems  Constitutional: Negative.   HENT: Negative.    Eyes: Negative.   Respiratory: Negative.    Cardiovascular: Negative.   Gastrointestinal: Negative.   Genitourinary: Negative.   Musculoskeletal: Negative.   Skin: Negative.   Neurological: Negative.   Endo/Heme/Allergies: Negative.   Psychiatric/Behavioral: Negative.      Family History  Problem Relation Age of Onset   Stroke Mother    Heart disease Mother    Emphysema Father    Heart disease Sister    Tongue cancer Brother    Heart disease Brother    Colon cancer Neg Hx    Colon polyps Neg Hx    Esophageal cancer Neg Hx    Rectal cancer Neg Hx    Stomach cancer Neg Hx     Social History   Socioeconomic History   Marital status: Married    Spouse name: Eber Jones   Number of children: 1   Years of education: Not on file   Highest education level: Not on file  Occupational History   Occupation: retired  Tobacco Use   Smoking status: Never   Smokeless tobacco: Never  Vaping Use   Vaping status:  Never Used  Substance and Sexual Activity   Alcohol use: Yes    Alcohol/week: 7.0 standard drinks of alcohol    Types: 7 Cans of beer per week   Drug use: No   Sexual activity: Not on file   Other Topics Concern   Not on file  Social History Narrative   ** Merged History Encounter **       Social Drivers of Corporate investment banker Strain: Not on file  Food Insecurity: Not on file  Transportation Needs: Not on file  Physical Activity: Not on file  Stress: Not on file  Social Connections: Not on file  Intimate Partner Violence: Not on file    Environmental and Social history  Lives in a house with a dry environment, no animals located inside the household, no carpet in the bedroom, no plastic on the bed, no plastic on the pillow, and no smoking ongoing with inside the household.  Objective:   Vitals:   05/25/23 0914  BP: 122/64  Pulse: 94  Resp: 18  SpO2: 95%   Height: 5\' 10"  (177.8 cm) Weight: 182 lb (82.6 kg)  Physical Exam Constitutional:      Appearance: He is not diaphoretic.  HENT:     Head: Normocephalic.     Right Ear: Tympanic membrane, ear canal and external ear normal.     Left Ear: Tympanic membrane, ear canal and external ear normal.     Nose: Nose normal. No mucosal edema or rhinorrhea.     Mouth/Throat:     Pharynx: Uvula midline. No oropharyngeal exudate.  Eyes:     Conjunctiva/sclera: Conjunctivae normal.  Neck:     Thyroid: No thyromegaly.     Trachea: Trachea normal. No tracheal tenderness or tracheal deviation.  Cardiovascular:     Rate and Rhythm: Normal rate and regular rhythm.     Heart sounds: Normal heart sounds, S1 normal and S2 normal. No murmur heard. Pulmonary:     Effort: No respiratory distress.     Breath sounds: Normal breath sounds. No stridor. No wheezing or rales.  Lymphadenopathy:     Head:     Right side of head: No tonsillar adenopathy.     Left side of head: No tonsillar adenopathy.     Cervical: No cervical adenopathy.  Skin:    Findings: No erythema or rash.     Nails: There is no clubbing.  Neurological:     Mental Status: He is alert.     Diagnostics: Allergy skin tests were not performed.    Assessment and Plan:    No diagnosis found.  Patient Instructions   1. Review all blood tests - further testing??  2.  Do not restart cayenne pepper supplement and all other supplements  3. Use the following every day:   A. Cetirizine 10 mg - 1 tablet 2 times per day  B. Famotidine 20 mg - 1 tablet 2 times per day  C. Benadryl 25 - 1 tablet in evening  4. Contact clinic with update in 3 weeks  5. Influenza = Tamiflu. Covid = Paxlovid      Jessica Priest, MD Allergy / Immunology [facility] [place] [facility] [place]

## 2023-05-25 NOTE — Patient Instructions (Addendum)
  1. Review all blood tests - further testing??  2.  Do not restart cayenne pepper supplement and discontinue all other supplements  3. Use the following every day:   A. Cetirizine 10 mg - 1 tablet 2 times per day  B. Famotidine 20 mg - 1 tablet 2 times per day  C. Benadryl 25 - 1 tablet in evening  4. Contact clinic with update in 3 weeks  5. Influenza = Tamiflu. Covid = Paxlovid

## 2023-05-26 ENCOUNTER — Encounter: Payer: Self-pay | Admitting: Allergy and Immunology

## 2023-05-26 DIAGNOSIS — Z01818 Encounter for other preprocedural examination: Secondary | ICD-10-CM | POA: Diagnosis not present

## 2023-06-13 ENCOUNTER — Encounter: Payer: Self-pay | Admitting: Orthopaedic Surgery

## 2023-06-13 ENCOUNTER — Other Ambulatory Visit (INDEPENDENT_AMBULATORY_CARE_PROVIDER_SITE_OTHER): Payer: Self-pay

## 2023-06-13 ENCOUNTER — Ambulatory Visit: Payer: Medicare Other | Admitting: Orthopaedic Surgery

## 2023-06-13 ENCOUNTER — Encounter: Payer: Self-pay | Admitting: *Deleted

## 2023-06-13 DIAGNOSIS — Z96642 Presence of left artificial hip joint: Secondary | ICD-10-CM | POA: Diagnosis not present

## 2023-06-13 NOTE — Progress Notes (Signed)
 The patient is now almost 9 months status post a left total hip replacement and was performed by one of my colleagues in town.  That was on September 15, 2022.  On October 31, 2022 he sustained a dislocation of the hip and a close reduction was performed.  The patient then sought a second opinion with me later last year in September with hip pain.  Since the dislocation in late July 2024, he has not had any other dislocations.  He was on a cruise last week with his wife and did develop shingles.  He is 83 years old.  He denies any instability symptoms of his left hip at this point.  The left hip moves smoothly and fluidly.  His leg lengths appear equal.  I did not sense any signs or symptoms of instability on my exam.  An AP pelvis and lateral of his left hip shows a well-seated left total hip arthroplasty with no complicating features.  The fit of the components and the offset appear appropriate.  At this point we will see him back for final visit in 6 months from now with a final just AP pelvis.  If he does develop any symptoms of instability he will let us know.

## 2023-06-28 ENCOUNTER — Telehealth: Payer: Self-pay

## 2023-06-28 NOTE — Telephone Encounter (Signed)
 Patient wanted to give you an update. He states he is feeling much better. The zyrtec/ famotidine/benadryl has been working for him and no more itching and if you need to see him again he will make a f/u appointment or just f/u as needed.

## 2023-06-28 NOTE — Telephone Encounter (Signed)
Patient informed and appointment was scheduled.

## 2023-10-03 ENCOUNTER — Encounter: Payer: Self-pay | Admitting: Allergy and Immunology

## 2023-10-03 ENCOUNTER — Ambulatory Visit: Admitting: Allergy and Immunology

## 2023-10-03 VITALS — BP 134/76 | HR 64 | Resp 12

## 2023-10-03 DIAGNOSIS — L299 Pruritus, unspecified: Secondary | ICD-10-CM

## 2023-10-03 NOTE — Progress Notes (Unsigned)
 [place] - [place] - [place] - [place] - [place]   Follow-up Note  Referring Provider: Jama Chow, MD Primary Provider: Jama Chow, MD Date of Office Visit: 10/03/2023  Subjective:   Daniel Wagner (DOB: 1940/06/07) is a 83 y.o. male who [facility] 10[facility]/2023 in re-evaluation of the following:  HPI: Daniel Wagner returns to this clinic in reevaluation of pruritic disorder.  While remaining on an H1 and H2 receptor blocker he has not had any pruritus.  He has stopped his pepper supplement.  Since I have seen him in this clinic he has contracted shingles on his left abdomen requiring Lyrica and he completed right shoulder replacement 8 weeks ago and apparently has had some issues with dislocation of the joint on 2 occasions since that procedure and has a appointment to see his surgeon.  Allergies as of 10/03/2023       Reactions   Aspirin  Other (See Comments)   Jittery if taking large quantities        Medication List    acetaminophen  500 MG tablet Commonly known as: TYLENOL  Take 1,000 mg by mouth every 6 (six) hours as needed for moderate pain.   ACIDOPHILUS PO Take by mouth.   amitriptyline 10 MG tablet Commonly known as: ELAVIL TAKE ONE TABLET BY MOUTH AT BEDTIME FOR SHINGLES pain   ascorbic acid  500 MG tablet Commonly known as: VITAMIN C  Take 500 mg by mouth 2 (two) times daily.   b complex vitamins capsule Take 1 capsule by mouth 2 (two) times daily.   Biotin 5000 MCG Caps Take by mouth.   BLACK ELDERBERRY PO Take by mouth.   CALCIUM  500 PO Take 500 mg by mouth daily.   CAYENNE PO Take by mouth.   celecoxib 200 MG capsule Commonly known as: CELEBREX TAKE ONE CAPSULE BY MOUTH TWICE DAILY with meals   cetirizine 10 MG tablet Commonly known as: ZYRTEC Take 10 mg by mouth daily as needed for allergies.   clindamycin 300 MG capsule Commonly known as: CLEOCIN Take 300 mg by mouth 3 (three) times daily.   CoQ10  100 MG Caps Take 100 mg by mouth daily.   Cranberry 4200 MG Caps Take 4,200 mg by mouth daily.   doxazosin  4 MG tablet Commonly known as: CARDURA  Take 4 mg by mouth daily.   Echinacea 400 MG Caps Take 400 mg by mouth daily.   esomeprazole 20 MG capsule Commonly known as: NEXIUM Take 20 mg by mouth daily with breakfast.   famotidine  20 MG tablet Commonly known as: PEPCID  Take 1 tablet twice daily   Flax Seed Oil 1000 MG Caps Take 1,000 mg by mouth daily.   fluticasone  50 MCG/ACT nasal spray Commonly known as: FLONASE  Place 2 sprays into both nostrils daily as needed for allergies.   gabapentin 300 MG capsule Commonly known as: NEURONTIN Take 300-600 mg by mouth See admin instructions. Take 300 mg in the morning and 600 mg at night   gabapentin 600 MG tablet Commonly known as: NEURONTIN Take 600 mg by mouth 2 (two) times daily.   Garlic 1000 MG Caps Take 1,000 mg by mouth 2 (two) times daily.   GAS-X PO Take 1 capsule by mouth 3 (three) times daily as needed (bloating). Uses [employer] brand   GLUCOSAMINE-CHONDROITIN PO Take 1 tablet by mouth daily.   guaifenesin  400 MG Tabs tablet Commonly known as: HUMIBID E Take 400 mg by mouth 3 (three) times daily as needed (  allergies).   HYALURONIC ACID PO Take by mouth.   HYDROcodone -acetaminophen  7.5-325 MG tablet Commonly known as: NORCO Take 1 tablet by mouth every 12 (twelve) hours as needed.   HYDROcodone -acetaminophen  5-325 MG tablet Commonly known as: Norco Take 1 tablet by mouth every 6 (six) hours as needed for moderate pain or severe pain.   IRON PO Take by mouth.   K2 PO Take by mouth.   lisinopril  20 MG tablet Commonly known as: ZESTRIL  Take 20 mg by mouth daily.   lisinopril  10 MG tablet Commonly known as: ZESTRIL  Take 10 mg by mouth daily.   lovastatin 40 MG tablet Commonly known as: MEVACOR Take 40 mg by mouth daily.   Magnesium 250 MG Tabs Take 250 mg by mouth 2 (two) times daily.    meloxicam  7.5 MG tablet Commonly known as: MOBIC  Take 7.5 mg by mouth 2 (two) times daily.   methocarbamol  500 MG tablet Commonly known as: ROBAXIN  Take 1 tablet (500 mg total) by mouth every 6 (six) hours as needed for muscle spasms.   Milk Thistle 1000 MG Caps Take by mouth.   mupirocin ointment 2 % Commonly known as: BACTROBAN Apply topically 3 (three) times daily.   niacin 500 MG tablet Commonly known as: VITAMIN B3 Take 500 mg by mouth daily.   Omega 3 1000 MG Caps Take by mouth.   OVER THE COUNTER MEDICATION Take 1 capsule by mouth 2 (two) times daily. Juice Festiv supplement   Polyethyl Glycol-Propyl Glycol 0.4-0.3 % Soln Apply 1 drop to eye at bedtime as needed (dry eyes).   polyethylene glycol powder 17 GM/SCOOP powder Commonly known as: MiraLax  Take 17 g by mouth daily as needed for moderate constipation or severe constipation.   Potassium 99 MG Tabs Take 99 mg by mouth 2 (two) times daily.   pregabalin 100 MG capsule Commonly known as: LYRICA Take 1 Capsule by mouth 4 times per day   PRO-BIOTIC BLEND PO Take 1 capsule by mouth daily.   pyridOXINE 100 MG tablet Commonly known as: VITAMIN B6 Take 100 mg by mouth daily.   sildenafil 50 MG tablet Commonly known as: VIAGRA Take 1 tablet by mouth daily as needed for sexual activity; administer 30 minutes to 4 hours before activity   testosterone  cypionate 200 MG/ML injection Commonly known as: DEPOTESTOSTERONE CYPIONATE Inject 200 mg into the muscle every 28 (twenty-eight) days. For IM use only   vitamin B-12 500 MCG tablet Commonly known as: CYANOCOBALAMIN Take 500 mcg by mouth 2 (two) times daily.   Vitamin D (Cholecalciferol) 10 MCG (400 UNIT) Caps Take 400 Units by mouth daily.   Vitamin E 268 MG (400 UNIT) Caps Take 400 Units by mouth daily.   zolpidem  10 MG tablet Commonly known as: AMBIEN  Take 5 mg by mouth at bedtime as needed for sleep.    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   History of kidney stones 03/2013   Lithotrispy   Hyperlipidemia    Hypertension    Hypogonadism in  male    Leg pain    While Walking   Pneumonia    Pre-diabetes    Sinus problem     Past Surgical History:  Procedure Laterality Date   ABDOMINAL EXPOSURE N/A 07/30/2021   Procedure: ABDOMINAL EXPOSURE;  Surgeon: Magda Debby SAILOR, MD;  [facility];  Service: Vascular;  Laterality: N/A;   ANTERIOR CERVICAL DECOMPRESSION/DISCECTOMY FUSION 4 LEVELS N/A 06/30/2012   Procedure: ANTERIOR CERVICAL DECOMPRESSION/DISCECTOMY FUSION 4 LEVELS;  Surgeon: Victory Gens, MD;  [facility];  Service: Neurosurgery;  Laterality: N/A;  C3-4 C4-5 C5-6 C6-7 Anterior cervical decompression/diskectomy/fusion   ANTERIOR LUMBAR FUSION N/A 07/30/2021   Procedure: LUMBAR FIVE-SACRAL ONE ANTERIOR LUMBAR INTERBODY FUSION;  Surgeon: Gens Victory, MD;  Location: [facility] OR;  Service: Neurosurgery;  Laterality: N/A;   APPLICATION OF ROBOTIC ASSISTANCE FOR SPINAL PROCEDURE N/A 06/26/2020   Procedure: APPLICATION OF ROBOTIC ASSISTANCE FOR SPINAL PROCEDURE;  Surgeon: Gens Victory, MD;  Location: [facility] OR;  Service: Neurosurgery;  Laterality: N/A;   BACK SURGERY  08/2010   lumbar fusion   CATARACT EXTRACTION, BILATERAL     CHOLECYSTECTOMY  1990's   COLONOSCOPY  02/04/2015   HIP CLOSED REDUCTION Left 10/31/2022   Procedure: CLOSED REDUCTION HIP;  Surgeon: Sharl Selinda Dover, MD;  Location: [facility]S;  Service: Orthopedics;  Laterality: Left;   NECK SURGERY     x2   SHOULDER SURGERY Right 2025   sinus surgeries     x2   SKIN CANCER EXCISION     arm chest and head basal  cell   TOTAL HIP ARTHROPLASTY Left 09/15/2022   Procedure: TOTAL HIP ARTHROPLASTY ANTERIOR APPROACH;  Surgeon: Fidel Rogue, MD;  Location: [facility]S;  Service: Orthopedics;  Laterality: Left;  150   TRANSFORAMINAL LUMBAR INTERBODY FUSION (TLIF) WITH PEDICLE SCREW FIXATION 1 LEVEL N/A 06/03/2022   Procedure: T8-9 TLIF;  Surgeon: Gens Victory, MD;  Location[facility] OR;  Service: Neurosurgery;  Laterality: N/A;  RM 20 3C   TRANSURETHRAL RESECTION OF PROSTATE  1999    Review of systems negative except as noted in HPI / PMHx or noted below:  Review of Systems  Constitutional: Negative.   HENT: Negative.    Eyes: Negative.   Respiratory: Negative.    Cardiovascular: Negative.   Gastrointestinal: Negative.   Genitourinary: Negative.   Musculoskeletal: Negative.   Skin: Negative.   Neurological: Negative.   Endo/Heme/Allergies: Negative.   Psychiatric/Behavioral: Negative.       Objective:   Vitals:   10/03/23 0957  BP: 134/76  Pulse: 64  Resp: 12  SpO2: 95%          Physical Exam - deferred  Diagnostics: Results of blood test dated 05 October 2023 identifies creatinine 1.0 Mg/DL, WBC 7.7, absolute lymphocyte 2000, 1.4% eosinophil, 0.7%, hemoglobin 15.7, platelet 221.  Assessment and Plan:   1. Pruritic disorder    1.  Continue to use the following every day:   A. Cetirizine 10 mg - 1 tablet 1-2 times per day  B. Famotidine  20 mg - 1 tablet 1-2 times per day  C. Benadryl 25 - if needed  2. Further evaluation???  3. Call with update in 3 months  Hadriel appears to have resolved his pruritic disorder and will allow him to now taper down his H1 and H2 receptor blocker to just 1 time per day for the next few months and then hopefully discontinue this agent.  He will contact us  in 3 months by telephone noting his progress.  Camellia Denis, MD Allergy / Immunology [facility]

## 2023-10-03 NOTE — Patient Instructions (Addendum)
  1.  Continue to use the following every day:   A. Cetirizine 10 mg - 1 tablet 1-2 times per day  B. Famotidine  20 mg - 1 tablet 1-2 times per day  C. Benadryl 25 - if needed  2. Further evaluation???  3. Call with update in 3 months

## 2023-10-04 ENCOUNTER — Encounter: Payer: Self-pay | Admitting: Allergy and Immunology

## 2023-12-14 ENCOUNTER — Other Ambulatory Visit (INDEPENDENT_AMBULATORY_CARE_PROVIDER_SITE_OTHER): Payer: Self-pay

## 2023-12-14 ENCOUNTER — Ambulatory Visit (INDEPENDENT_AMBULATORY_CARE_PROVIDER_SITE_OTHER): Admitting: Orthopaedic Surgery

## 2023-12-14 ENCOUNTER — Encounter: Payer: Self-pay | Admitting: Orthopaedic Surgery

## 2023-12-14 DIAGNOSIS — Z96642 Presence of left artificial hip joint: Secondary | ICD-10-CM

## 2023-12-14 NOTE — Progress Notes (Signed)
 The patient is now 15 months status post a left direct anterior total hip arthroplasty performed by one of our orthopedic colleagues in town.  This was in early June 2024.  He did have a dislocation in July 2024.  That was closed reduced by that practice.  He then saw a second opinion and follow-up with our practice.  He has not had a dislocation since then.  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  This was done in [place].  He says the hip is doing well.  He does have some stiffness he states.  He does have a history of significant lumbar spine surgery with a lower lumbar fusion with instrumentation.  His left hip moves smoothly and fluidly no blocks or rotation and his leg lengths are near equal.  Standing AP pelvis shows a well-seated left total hip arthroplasty with no complicating features.  At this point follow-up for his hip can be as needed.  We went over his x-rays.  It is a good thing that he has not come out anymore and I think this should continue to do well.  If there are any issues he knows to let us  know.

## 2024-02-13 ENCOUNTER — Encounter: Payer: Self-pay | Admitting: Radiology

## 2024-04-10 ENCOUNTER — Telehealth: Payer: Self-pay | Admitting: Gastroenterology

## 2024-04-10 ENCOUNTER — Other Ambulatory Visit: Payer: Self-pay

## 2024-04-10 MED ORDER — HYDROCORTISONE ACETATE 25 MG RE SUPP: 25.0000 mg | Freq: Two times a day (BID) | RECTAL | 1 refills | Status: AC

## 2024-04-10 NOTE — Telephone Encounter (Signed)
 Received call this AM @755 . Sunday had diarrhea. This slowed a little into Monday. Then Monday had 1 bowel movement that was looser. Noted blood yesterday. This stopped. Today now has had 2 Bms, which are firming up (not as loose) but noticed blood with this. He feels irritation in the perirectum region. He thinks hemorrhoids likely playing a role. No fevers or chills. Noone else is sick. Reviewed his last colonoscopy in 2022 and that showed internal hemorrhoids with plan to consider 3-year colonoscopy. He has seen Dr. Charlanne in [place] in the past and would like to followup with him.  I will have our RN team send in Anusol suppositories. Use BID for next 6-days. If things progress over the course of today into tomorrow or become significant in regards to bleeding then will need to come into the hospital for further evaluation vs CBC and continued close monitoring as outpatient. I'll have RN team reach out to patient this late AM to see how he has done and make sure they send in the prescription (Anusol BID 12/1). He should have appointment with Dr. Charlanne or an APP in his Pod for 2026 to discuss possible updated colonoscopy.    Aloha Finner, MD [facility] Office # [phone]

## 2024-04-10 NOTE — Telephone Encounter (Signed)
 Spoke with patient. Reports large bowel movement this morning. Less blood today. He is feeling good about this. Rx to [facility]. Call back if he fails to improve or he acutely worsens.
